# Patient Record
Sex: Male | Born: 1935 | Race: White | Hispanic: No | Smoking: Former smoker
Health system: Southern US, Community
[De-identification: ages and names within clinical notes are randomized; demographics above are authoritative.]

## PROBLEM LIST (undated history)

## (undated) DIAGNOSIS — T7840XA Allergy, unspecified, initial encounter: Secondary | ICD-10-CM

## (undated) DIAGNOSIS — J309 Allergic rhinitis, unspecified: Secondary | ICD-10-CM

## (undated) DIAGNOSIS — I10 Essential (primary) hypertension: Secondary | ICD-10-CM

## (undated) DIAGNOSIS — E785 Hyperlipidemia, unspecified: Secondary | ICD-10-CM

## (undated) DIAGNOSIS — K219 Gastro-esophageal reflux disease without esophagitis: Secondary | ICD-10-CM

## (undated) HISTORY — PX: SHOULDER SURGERY: SHX246

## (undated) HISTORY — DX: Allergic rhinitis, unspecified: J30.9

## (undated) HISTORY — DX: Essential (primary) hypertension: I10

## (undated) HISTORY — DX: Hyperlipidemia, unspecified: E78.5

## (undated) HISTORY — DX: Allergy, unspecified, initial encounter: T78.40XA

## (undated) HISTORY — DX: Gastro-esophageal reflux disease without esophagitis: K21.9

## (undated) HISTORY — PX: HAND SURGERY: SHX662

---

## 1973-10-08 HISTORY — PX: BACK SURGERY: SHX140

## 1987-06-11 HISTORY — PX: NOSE SURGERY: SHX723

## 1998-05-02 ENCOUNTER — Emergency Department (HOSPITAL_COMMUNITY): Admission: EM | Admit: 1998-05-02 | Discharge: 1998-05-02 | Payer: Self-pay | Admitting: Emergency Medicine

## 1998-05-09 ENCOUNTER — Emergency Department (HOSPITAL_COMMUNITY): Admission: EM | Admit: 1998-05-09 | Discharge: 1998-05-09 | Payer: Self-pay

## 2002-06-04 ENCOUNTER — Encounter: Admission: RE | Admit: 2002-06-04 | Discharge: 2002-06-04 | Payer: Self-pay | Admitting: Geriatric Medicine

## 2002-06-04 ENCOUNTER — Encounter: Payer: Self-pay | Admitting: Geriatric Medicine

## 2002-06-23 ENCOUNTER — Encounter: Payer: Self-pay | Admitting: Neurosurgery

## 2002-06-23 ENCOUNTER — Inpatient Hospital Stay (HOSPITAL_COMMUNITY): Admission: RE | Admit: 2002-06-23 | Discharge: 2002-06-24 | Payer: Self-pay | Admitting: Neurosurgery

## 2002-07-11 HISTORY — PX: BACK SURGERY: SHX140

## 2002-09-27 ENCOUNTER — Encounter: Payer: Self-pay | Admitting: Geriatric Medicine

## 2002-09-27 ENCOUNTER — Encounter: Admission: RE | Admit: 2002-09-27 | Discharge: 2002-09-27 | Payer: Self-pay | Admitting: Geriatric Medicine

## 2004-12-22 ENCOUNTER — Encounter: Admission: RE | Admit: 2004-12-22 | Discharge: 2004-12-22 | Payer: Self-pay | Admitting: Geriatric Medicine

## 2005-05-22 ENCOUNTER — Encounter: Admission: RE | Admit: 2005-05-22 | Discharge: 2005-05-22 | Payer: Self-pay | Admitting: Geriatric Medicine

## 2005-12-12 ENCOUNTER — Encounter: Admission: RE | Admit: 2005-12-12 | Discharge: 2005-12-12 | Payer: Self-pay | Admitting: Internal Medicine

## 2006-04-21 ENCOUNTER — Ambulatory Visit (HOSPITAL_COMMUNITY): Admission: RE | Admit: 2006-04-21 | Discharge: 2006-04-21 | Payer: Self-pay | Admitting: Geriatric Medicine

## 2006-06-30 ENCOUNTER — Encounter: Admission: RE | Admit: 2006-06-30 | Discharge: 2006-06-30 | Payer: Self-pay | Admitting: Orthopedic Surgery

## 2006-07-03 ENCOUNTER — Ambulatory Visit (HOSPITAL_BASED_OUTPATIENT_CLINIC_OR_DEPARTMENT_OTHER): Admission: RE | Admit: 2006-07-03 | Discharge: 2006-07-03 | Payer: Self-pay | Admitting: Orthopedic Surgery

## 2007-02-07 ENCOUNTER — Encounter: Admission: RE | Admit: 2007-02-07 | Discharge: 2007-02-07 | Payer: Self-pay | Admitting: Geriatric Medicine

## 2008-03-13 ENCOUNTER — Encounter: Admission: RE | Admit: 2008-03-13 | Discharge: 2008-03-13 | Payer: Self-pay | Admitting: Orthopedic Surgery

## 2008-11-01 ENCOUNTER — Ambulatory Visit (HOSPITAL_BASED_OUTPATIENT_CLINIC_OR_DEPARTMENT_OTHER): Admission: RE | Admit: 2008-11-01 | Discharge: 2008-11-01 | Payer: Self-pay | Admitting: Orthopedic Surgery

## 2009-04-03 ENCOUNTER — Ambulatory Visit: Payer: Self-pay | Admitting: Urology

## 2009-04-05 ENCOUNTER — Ambulatory Visit: Payer: Self-pay | Admitting: Urology

## 2010-06-13 ENCOUNTER — Ambulatory Visit (HOSPITAL_COMMUNITY)
Admission: RE | Admit: 2010-06-13 | Discharge: 2010-06-13 | Payer: Self-pay | Source: Home / Self Care | Attending: Internal Medicine | Admitting: Internal Medicine

## 2010-07-27 ENCOUNTER — Other Ambulatory Visit: Payer: Self-pay | Admitting: Specialist

## 2010-07-27 DIAGNOSIS — M542 Cervicalgia: Secondary | ICD-10-CM

## 2010-07-31 ENCOUNTER — Ambulatory Visit
Admission: RE | Admit: 2010-07-31 | Discharge: 2010-07-31 | Disposition: A | Discharge status: Home / Self Care | Payer: Self-pay | Source: Ambulatory Visit | Attending: Specialist | Admitting: Specialist

## 2010-07-31 ENCOUNTER — Ambulatory Visit
Admission: RE | Admit: 2010-07-31 | Discharge: 2010-07-31 | Disposition: A | Discharge status: Home / Self Care | Payer: MEDICARE | Source: Ambulatory Visit | Attending: Specialist | Admitting: Specialist

## 2010-07-31 DIAGNOSIS — M542 Cervicalgia: Secondary | ICD-10-CM

## 2010-09-18 LAB — BASIC METABOLIC PANEL
CO2: 29 mEq/L (ref 19–32)
Calcium: 9.3 mg/dL (ref 8.4–10.5)
Chloride: 104 mEq/L (ref 96–112)
Creatinine, Ser: 1.17 mg/dL (ref 0.4–1.5)
GFR calc non Af Amer: 60 mL/min — ABNORMAL LOW (ref >60)
Potassium: 4.4 mEq/L (ref 3.5–5.1)

## 2010-10-23 NOTE — Op Note (Signed)
NAMEESMERALDA, Frederick Simon                ACCOUNT NO.:  [id number]   MEDICAL RECORD NO.:  [id number]          PATIENT TYPE:  AMB   LOCATION:  DSC                          FACILITY:  [facility]   PHYSICIAN:  Cindee Salt, M.D.       DATE OF BIRTH:  11-29-1926   DATE OF PROCEDURE:  11/01/2008  DATE OF DISCHARGE:                               OPERATIVE REPORT   PREOPERATIVE DIAGNOSIS:  Carpal tunnel syndrome, left hand.   POSTOPERATIVE DIAGNOSIS:  Carpal tunnel syndrome, left hand.   OPERATION:  Decompression, left median nerve.   SURGEON:  Cindee Salt, MD   ASSISTANT:  Carolyne Fiscal, RN   ANESTHESIA:  Forearm-based IV regional with local infiltration.   ANESTHESIOLOGIST:  Bedelia Person, MD   HISTORY:  The patient is an 77 year old male with a history of carpal  tunnel syndrome, EMG nerve conduction is positive, which has not  responded to conservative treatment.  He has elected to undergo surgical  decompression.  Pre, peri, and postoperative course had been discussed  along with risks and complications.  He is aware that there is no  guarantee with the surgery, possibility of infection, recurrence injury  to arteries, nerves, tendons, complete relief of symptoms, or dystrophy.  In preoperative area, the patient is seen.  The extremity marked by both  the patient and surgeon.  Antibiotic given.   PROCEDURE:  The patient was brought to the operating room where a  forearm-based IV regional anesthetic was carried out without difficulty.  He was prepped using ChloraPrep in supine position with the left arm  free.  A 3-minute dry time was allowed, a time-out taken confirming the  patient and procedure.  The drapes were placed.  A longitudinal incision  was made and carried down through subcutaneous tissues.  Bleeders were  electrocauterized, bipolar.  Palmar fascia was split, superficial palmar  arch identified, and flexor tendon of the ring little finger identified  to the ulnar side of median nerve.  The  carpal retinaculum was incised  with sharp dissection, right angle and Sewall retractor were placed  between skin and forearm fascia.  Fascia was released for approximately  a centimeter and a half proximal to the wrist crease.  Under direct  vision, the canal was explored.  Area compression to the nerve was  apparent.  No further lesions were identified.  The wound was irrigated.  Skin was then closed with interrupted 5-0 Vicryl Rapide sutures.  A  local infiltration was given with 0.25% Marcaine without epinephrine, 5  mL was used.  A sterile compressive dressing and splint to the wrist was  applied.  Deflation of the tourniquet, all fingers immediately pinked.  He was taken to the recovery room for observation in satisfactory  condition.  He will be discharged home.  To return to the [facility] in 1 week, on Vicodin.           ______________________________  Cindee Salt, M.D.     GK/MEDQ  D:  11/01/2008  T:  11/01/2008  Job:  [id number]   cc:   Jonny Ruiz  Bo Mcclintock, MD

## 2010-10-26 NOTE — Op Note (Signed)
NAME:  Frederick Simon, Frederick Simon                          ACCOUNT NO.:  [id number]   MEDICAL RECORD NO.:  [id number]                   PATIENT TYPE:  INP   LOCATION:  2899                                 FACILITY:  [facility]   PHYSICIAN:  Hewitt Shorts, M.D.            DATE OF BIRTH:  1926/07/12   DATE OF PROCEDURE:  06/24/2002  DATE OF DISCHARGE:                                 OPERATIVE REPORT   PREOPERATIVE DIAGNOSIS:  Left L2-3 lumbar disk herniation.   POSTOPERATIVE DIAGNOSIS:  Left L2-3 lumbar disk herniation.   PROCEDURE:  Left L2-3 lumbar laminotomy and microdiskectomy.   SURGEON:  Hewitt Shorts, M.D.   ASSISTANT:  Danae Orleans. Venetia Maxon, M.D.   ANESTHESIA:  General endotracheal.   INDICATIONS:  The patient is a 77 year old man who presents with acute left  lumbar radiculopathy with significant iliopsoas and quadriceps weakness, who  was found by MRI scan to have a large left L2-3 disk herniation with a  fragment that had migrated caudally behind the body of L3.  The decision was  made to proceed with elective foraminotomy and microdiskectomy.   DESCRIPTION OF PROCEDURE:  The patient was brought to the operating room and  placed under general endotracheal anesthesia.  The patient was turned to a  prone region, and the lumbar region was prepped with Betadine soap and  solution and draped in a sterile fashion.  The midline was infiltrated with  local anesthetic with epinephrine, an x-ray was taken and the L2-3 level  identified in the midline, and incision made over the L2-3 level and carried  down through the subcutaneous tissue with bipolar cautery, and  electrocautery was used to maintain hemostasis.  Dissection was carried down  to the lumbar fascia, which was incised on the left side of the midline, and  the paraspinous muscles were dissected from the spinous processes and  laminae in a subperiosteal fashion.  An x-ray was taken and the L2-3  interlaminar space identified,  and then the microscope was draped and  brought into the field to provide additional magnification, illumination,  and visualization, and the remainder of the procedure was performed using  microdissection and microsurgical technique.  A laminotomy was performed  using the [facility] Max drill along with Kerrison punches.  The ligamentum  flavum was carefully resected, and we identified the thecal sac and left L3  nerve root.  We were able to gently retract the thecal sac and nerve root  and identified a fragment of disk directly ventral to the nerve root and  compressing it.  This was carefully dissected from the surrounding epidural  tissues and removed, and good decompression of the thecal sac and nerve root  was achieved.  We examined the L2-3 disk.  There certainly was disk bulging  and degeneration, but it was felt that the nerve had been decompressed by  removal of the free fragment and that further  diskectomy would not achieve  any further decompression, and therefore it was decided to establish  hemostasis with the use of bipolar cautery as well as Gelfoam soaked in  thrombin and proceed with closure.  Once hemostasis was established, the  wound was irrigated with bacitracin solution and checked for hemostasis once  again, and then the deep fascia closed with interrupted, undyed 1 Vicryl  suture, the subcutaneous and subcuticular were closed with interrupted,  inverted 2-0 undyed Vicryl sutures, and the  skin was reapproximated with Dermabond.  The patient tolerated the procedure  well.  The estimated blood loss was 100 cubic centimeters.  Sponge and  needle count were correct.  Following surgery the patient was turned back to  the supine position, reversed from the anesthetic, to be extubated and  transferred to the recovery room for further care.                                               Hewitt Shorts, M.D.    RWN/MEDQ  D:  06/23/2002  T:  06/24/2002  Job:  [id number]

## 2010-10-26 NOTE — Op Note (Signed)
NAMEYASIEL, GOYNE                ACCOUNT NO.:  [id number]   MEDICAL RECORD NO.:  [id number]          PATIENT TYPE:  AMB   LOCATION:  DSC                          FACILITY:  [facility]   PHYSICIAN:  Cindee Salt, M.D.       DATE OF BIRTH:  [birth year]   DATE OF PROCEDURE:  07/03/2006  DATE OF DISCHARGE:                               OPERATIVE REPORT   PREOPERATIVE DIAGNOSIS:  Carpal tunnel syndrome, right hand.   POSTOPERATIVE DIAGNOSIS:  Carpal tunnel syndrome, right hand.   OPERATION:  Decompression, right median nerve.   SURGEON:  Merlyn Lot, M.D.   ASSISTANT:  Carolyne Fiscal R.N.   ANESTHESIA:  Forearm-based IV regional.   HISTORY:  The patient is a 77 year old male with a history of carpal  tunnel syndrome, EMG nerve conductions positive.  This has not responded  to conservative treatment.  He has undergone release on his left side  and is admitted now for release of the right.  He is aware of risks and  complications including infection, recurrence, injury to arteries-nerves-  tendons, incomplete relief of symptoms, dystrophy.  In preoperative  area, the patient is seen.  Questions encouraged and answered.  The  extremity is marked by both patient and surgeon.   PROCEDURE:  The patient was brought to the operating room where a  forearm-based IV regional anesthetic was carried out without difficulty.  It was prepped using DuraPrep, supine position, right arm free.  A  longitudinal incision was made in the palm and carried down through  subcutaneous tissue.  Bleeders were electrocauterized.  Palmar fascia  was split.  Superficial palmar arch identified.  Flexor tendon to the  ring and little finger identified to the ulnar side of median nerve.  Carpal retinaculum was incised with sharp dissection.  A right-angle and  Sewall retractor were placed between skin and forearm fascia.  The  fascia released for approximately a centimeter and half proximal to the  wrist crease under direct vision.   Canal was explored.  No further  lesions were identified.  Area of compression to the nerve was apparent.  Persistent median artery was present.  This was not thrombosed.  The  wound was irrigated.  Skin closed with interrupted 5-0 nylon sutures.  Sterile compressive dressing and splint to the wrist applied.  The  patient tolerated the procedure well and was taken to the recovery room  for observation in satisfactory condition.  He is discharged home to  return to the [facility] in 1 week on Vicodin.           ______________________________  Cindee Salt, M.D.     GK/MEDQ  D:  07/03/2006  T:  07/03/2006  Job:  [id number]   cc:   Hal T. Stoneking, M.D.

## 2011-04-17 ENCOUNTER — Other Ambulatory Visit: Payer: Self-pay | Admitting: Internal Medicine

## 2011-04-17 DIAGNOSIS — E041 Nontoxic single thyroid nodule: Secondary | ICD-10-CM

## 2011-06-17 ENCOUNTER — Ambulatory Visit
Admission: RE | Admit: 2011-06-17 | Discharge: 2011-06-17 | Disposition: A | Discharge status: Home / Self Care | Payer: Medicare Other | Source: Ambulatory Visit | Attending: Internal Medicine | Admitting: Internal Medicine

## 2011-06-17 DIAGNOSIS — E041 Nontoxic single thyroid nodule: Secondary | ICD-10-CM

## 2012-04-30 ENCOUNTER — Other Ambulatory Visit: Payer: Self-pay | Admitting: Internal Medicine

## 2012-04-30 DIAGNOSIS — R1013 Epigastric pain: Secondary | ICD-10-CM

## 2012-04-30 DIAGNOSIS — E041 Nontoxic single thyroid nodule: Secondary | ICD-10-CM

## 2012-04-30 DIAGNOSIS — K3189 Other diseases of stomach and duodenum: Secondary | ICD-10-CM

## 2012-05-15 ENCOUNTER — Ambulatory Visit
Admission: RE | Admit: 2012-05-15 | Discharge: 2012-05-15 | Disposition: A | Discharge status: Home / Self Care | Payer: Medicare Other | Source: Ambulatory Visit | Attending: Internal Medicine | Admitting: Internal Medicine

## 2012-05-15 ENCOUNTER — Other Ambulatory Visit: Payer: Self-pay | Admitting: Internal Medicine

## 2012-05-15 DIAGNOSIS — K3189 Other diseases of stomach and duodenum: Secondary | ICD-10-CM

## 2012-05-15 DIAGNOSIS — R1013 Epigastric pain: Secondary | ICD-10-CM

## 2012-06-23 ENCOUNTER — Ambulatory Visit
Admission: RE | Admit: 2012-06-23 | Discharge: 2012-06-23 | Disposition: A | Discharge status: Home / Self Care | Payer: Medicare Other | Source: Ambulatory Visit | Attending: Internal Medicine | Admitting: Internal Medicine

## 2012-06-23 DIAGNOSIS — E041 Nontoxic single thyroid nodule: Secondary | ICD-10-CM

## 2013-04-08 ENCOUNTER — Other Ambulatory Visit: Payer: Self-pay | Admitting: Otolaryngology

## 2013-04-08 DIAGNOSIS — H905 Unspecified sensorineural hearing loss: Secondary | ICD-10-CM

## 2013-04-19 ENCOUNTER — Ambulatory Visit
Admission: RE | Admit: 2013-04-19 | Discharge: 2013-04-19 | Disposition: A | Discharge status: Home / Self Care | Payer: Medicare Other | Source: Ambulatory Visit | Attending: Otolaryngology | Admitting: Otolaryngology

## 2013-04-19 DIAGNOSIS — H905 Unspecified sensorineural hearing loss: Secondary | ICD-10-CM

## 2013-04-19 MED ORDER — GADOBENATE DIMEGLUMINE 529 MG/ML IV SOLN
17.0000 mL | Freq: Once | INTRAVENOUS | Status: AC | PRN
  Administered 2013-04-19: 17 mL via INTRAVENOUS

## 2013-05-13 ENCOUNTER — Ambulatory Visit (INDEPENDENT_AMBULATORY_CARE_PROVIDER_SITE_OTHER): Payer: 59 | Admitting: Emergency Medicine

## 2013-05-13 VITALS — BP 122/76 | HR 92 | Temp 97.9°F | Resp 16 | Ht 67.0 in | Wt 187.8 lb

## 2013-05-13 DIAGNOSIS — J209 Acute bronchitis, unspecified: Secondary | ICD-10-CM

## 2013-05-13 DIAGNOSIS — J018 Other acute sinusitis: Secondary | ICD-10-CM

## 2013-05-13 MED ORDER — AMOXICILLIN-POT CLAVULANATE 875-125 MG PO TABS: 1.0000 | ORAL_TABLET | Freq: Two times a day (BID) | ORAL | Status: DC

## 2013-05-13 NOTE — Progress Notes (Signed)
[facility] [address] [place] [address] [phone]  Date:  05/13/2013   Name:  Frederick Simon   DOB:  03-17-1927   MRN:  [id number]  PCP:  No primary provider on file.    Chief Complaint: Cough, Headache, Hoarse and Sore Throat   History of Present Illness:  Frederick Simon is a 79 y.o. very pleasant male patient who presents with the following:  Ill with cough productive green sputum.  No shortness of breath but is wheezing. No fever or chills.  No nausea or vomiting.  No rash. Has no nasal congestion or discharge but has moderate post nasal drainage.  No orthopnea or PND.  Has sore throat.  No improvement with over the counter medications or other home remedies. Denies other complaint or health concern today.   There are no active problems to display for this patient.   Past Medical History  Diagnosis Date  . Hypertension   . Hyperlipidemia   . Allergy     Past Surgical History  Procedure Laterality Date  . Back surgery  May 1975     Dr. Leslee Home  . Nose surgery  1989    Dr. Lyman Bishop  . Back surgery  Feb 2004    Dr. Newell Coral   . Hand surgery  [id number]    Dr. Priscille Kluver  . Shoulder surgery  2008,2009    Dr. Priscille Kluver    History  Substance Use Topics  . Smoking status: Current Every Day Smoker  . Smokeless tobacco: Not on file  . Alcohol Use: No    Family History  Problem Relation Age of Onset  . Cancer Mother   . Hypertension Mother   . Hypertension Father   . Stroke Father     No Known Allergies  Medication list has been reviewed and updated.  No current outpatient prescriptions on file prior to visit.   No current facility-administered medications on file prior to visit.    Review of Systems:  As per HPI, otherwise negative.    Physical Examination: Filed Vitals:   05/13/13 1056  BP: 122/76  Pulse: 92  Temp: 97.9 F (36.6 C)  Resp: 16   Filed Vitals:   05/13/13 1056  Height: 5\' 7"  (1.702 m)  Weight: 187  lb 12.8 oz (85.186 kg)   Body mass index is 29.41 kg/(m^2). Ideal Body Weight: Weight in (lb) to have BMI = 25: 159.3  GEN: WDWN, NAD, Non-toxic, A & O x 3 HEENT: Atraumatic, Normocephalic. Neck supple. No masses, No LAD. Ears and Nose: No external deformity. CV: RRR, No M/G/R. No JVD. No thrill. No extra heart sounds. PULM: CTA B, no wheezes, crackles, rhonchi. No retractions. No resp. distress. No accessory muscle use. ABD: S, NT, ND, +BS. No rebound. No HSM. EXTR: No c/c/e NEURO Normal gait.  PSYCH: Normally interactive. Conversant. Not depressed or anxious appearing.  Calm demeanor.    Assessment and Plan: Sinusitis Bronchitis augmentin Coricidin HBP  Signed,  Phillips Odor, MD

## 2013-05-13 NOTE — Patient Instructions (Signed)
siSinusitis Sinusitis is redness, soreness, and swelling (inflammation) of the paranasal sinuses. Paranasal sinuses are air pockets within the bones of your face (beneath the eyes, the middle of the forehead, or above the eyes). In healthy paranasal sinuses, mucus is able to drain out, and air is able to circulate through them by way of your nose. However, when your paranasal sinuses are inflamed, mucus and air can become trapped. This can allow bacteria and other germs to grow and cause infection. Sinusitis can develop quickly and last only a short time (acute) or continue over a long period (chronic). Sinusitis that lasts for more than 12 weeks is considered chronic.  CAUSES  Causes of sinusitis include:  Allergies.  Structural abnormalities, such as displacement of the cartilage that separates your nostrils (deviated septum), which can decrease the air flow through your nose and sinuses and affect sinus drainage.  Functional abnormalities, such as when the small hairs (cilia) that line your sinuses and help remove mucus do not work properly or are not present. SYMPTOMS  Symptoms of acute and chronic sinusitis are the same. The primary symptoms are pain and pressure around the affected sinuses. Other symptoms include:  Upper toothache.  Earache.  Headache.  Bad breath.  Decreased sense of smell and taste.  A cough, which worsens when you are lying flat.  Fatigue.  Fever.  Thick drainage from your nose, which often is green and may contain pus (purulent).  Swelling and warmth over the affected sinuses. DIAGNOSIS  Your caregiver will perform a physical exam. During the exam, your caregiver may:  Look in your nose for signs of abnormal growths in your nostrils (nasal polyps).  Tap over the affected sinus to check for signs of infection.  View the inside of your sinuses (endoscopy) with a special imaging device with a light attached (endoscope), which is inserted into your  sinuses. If your caregiver suspects that you have chronic sinusitis, one or more of the following tests may be recommended:  Allergy tests.  Nasal culture A sample of mucus is taken from your nose and sent to a lab and screened for bacteria.  Nasal cytology A sample of mucus is taken from your nose and examined by your caregiver to determine if your sinusitis is related to an allergy. TREATMENT  Most cases of acute sinusitis are related to a viral infection and will resolve on their own within 10 days. Sometimes medicines are prescribed to help relieve symptoms (pain medicine, decongestants, nasal steroid sprays, or saline sprays).  However, for sinusitis related to a bacterial infection, your caregiver will prescribe antibiotic medicines. These are medicines that will help kill the bacteria causing the infection.  Rarely, sinusitis is caused by a fungal infection. In theses cases, your caregiver will prescribe antifungal medicine. For some cases of chronic sinusitis, surgery is needed. Generally, these are cases in which sinusitis recurs more than 3 times per year, despite other treatments. HOME CARE INSTRUCTIONS   Drink plenty of water. Water helps thin the mucus so your sinuses can drain more easily.  Use a humidifier.  Inhale steam 3 to 4 times a day (for example, sit in the bathroom with the shower running).  Apply a warm, moist washcloth to your face 3 to 4 times a day, or as directed by your caregiver.  Use saline nasal sprays to help moisten and clean your sinuses.  Take over-the-counter or prescription medicines for pain, discomfort, or fever only as directed by your caregiver. SEEK IMMEDIATE MEDICAL  CARE IF:  You have increasing pain or severe headaches.  You have nausea, vomiting, or drowsiness.  You have swelling around your face.  You have vision problems.  You have a stiff neck.  You have difficulty breathing. MAKE SURE YOU:   Understand these  instructions.  Will watch your condition.  Will get help right away if you are not doing well or get worse. Document Released: 05/27/2005 Document Revised: 08/19/2011 Document Reviewed: 06/11/2011 [facility] Patient Information 2014 [place], [place]. Bronchitis Bronchitis is the body's way of reacting to injury and/or infection (inflammation) of the bronchi. Bronchi are the air tubes that extend from the windpipe into the lungs. If the inflammation becomes severe, it may cause shortness of breath. CAUSES  Inflammation may be caused by:  A virus.  Germs (bacteria).  Dust.  Allergens.  Pollutants and many other irritants. The cells lining the bronchial tree are covered with tiny hairs (cilia). These constantly beat upward, away from the lungs, toward the mouth. This keeps the lungs free of pollutants. When these cells become too irritated and are unable to do their job, mucus begins to develop. This causes the characteristic cough of bronchitis. The cough clears the lungs when the cilia are unable to do their job. Without either of these protective mechanisms, the mucus would settle in the lungs. Then you would develop pneumonia. Smoking is a common cause of bronchitis and can contribute to pneumonia. Stopping this habit is the single most important thing you can do to help yourself. TREATMENT   Your caregiver may prescribe an antibiotic if the cough is caused by bacteria. Also, medicines that open up your airways make it easier to breathe. Your caregiver may also recommend or prescribe an expectorant. It will loosen the mucus to be coughed up. Only take over-the-counter or prescription medicines for pain, discomfort, or fever as directed by your caregiver.  Removing whatever causes the problem (smoking, for example) is critical to preventing the problem from getting worse.  Cough suppressants may be prescribed for relief of cough symptoms.  Inhaled medicines may be prescribed to help with  symptoms now and to help prevent problems from returning.  For those with recurrent (chronic) bronchitis, there may be a need for steroid medicines. SEEK IMMEDIATE MEDICAL CARE IF:   During treatment, you develop more pus-like mucus (purulent sputum).  You have a fever.  You become progressively more ill.  You have increased difficulty breathing, wheezing, or shortness of breath. It is necessary to seek immediate medical care if you are elderly or sick from any other disease. MAKE SURE YOU:   Understand these instructions.  Will watch your condition.  Will get help right away if you are not doing well or get worse. Document Released: 05/27/2005 Document Revised: 01/27/2013 Document Reviewed: 01/19/2013 [facility] Patient Information 2014 [place], [place].

## 2017-06-26 ENCOUNTER — Emergency Department (HOSPITAL_BASED_OUTPATIENT_CLINIC_OR_DEPARTMENT_OTHER)
Admission: EM | Admit: 2017-06-26 | Discharge: 2017-06-27 | Disposition: A | Discharge status: Home / Self Care | Payer: Medicare Other | Attending: Emergency Medicine | Admitting: Emergency Medicine

## 2017-06-26 ENCOUNTER — Encounter (HOSPITAL_BASED_OUTPATIENT_CLINIC_OR_DEPARTMENT_OTHER): Payer: Self-pay

## 2017-06-26 ENCOUNTER — Other Ambulatory Visit: Payer: Self-pay

## 2017-06-26 DIAGNOSIS — Z7982 Long term (current) use of aspirin: Secondary | ICD-10-CM | POA: Diagnosis not present

## 2017-06-26 DIAGNOSIS — I10 Essential (primary) hypertension: Secondary | ICD-10-CM | POA: Diagnosis not present

## 2017-06-26 DIAGNOSIS — F17210 Nicotine dependence, cigarettes, uncomplicated: Secondary | ICD-10-CM | POA: Insufficient documentation

## 2017-06-26 DIAGNOSIS — R51 Headache: Secondary | ICD-10-CM | POA: Insufficient documentation

## 2017-06-26 DIAGNOSIS — Z79899 Other long term (current) drug therapy: Secondary | ICD-10-CM | POA: Insufficient documentation

## 2017-06-26 HISTORY — DX: Gastro-esophageal reflux disease without esophagitis: K21.9

## 2017-06-26 NOTE — ED Triage Notes (Addendum)
High blood pressure without any symptoms of chest pain, no SOB, no dizziness, no weakness, no numbness, no recent changes to meds, pt lives across from a nurse who told him to come to the ED immediately bc his BP was 200/100.  Pt alert and laughing in triage.  He took 324 aspirin prior to arrival.

## 2017-06-27 ENCOUNTER — Emergency Department (HOSPITAL_BASED_OUTPATIENT_CLINIC_OR_DEPARTMENT_OTHER): Payer: Medicare Other

## 2017-06-27 MED ORDER — HYDROCHLOROTHIAZIDE 25 MG PO TABS
12.5000 mg | ORAL_TABLET | Freq: Once | ORAL | Status: AC
  Administered 2017-06-27: 12.5 mg via ORAL
  Filled 2017-06-27: qty 1

## 2017-06-27 NOTE — ED Notes (Signed)
Patient transported to CT 

## 2017-06-27 NOTE — ED Provider Notes (Signed)
[facility] EMERGENCY DEPARTMENT Provider Note   CSN: [id number] Arrival date & time: 06/26/17  2310     History   Chief Complaint Chief Complaint  Patient presents with  . Hypertension    HPI Frederick Simon is a 84 y.o. male.  The history is provided by the patient.  Hypertension  This is a chronic problem. The current episode started more than 1 week ago. The problem occurs constantly. The problem has not changed since onset.Pertinent negatives include no chest pain, no abdominal pain and no shortness of breath. Nothing aggravates the symptoms. Nothing relieves the symptoms. He has tried nothing for the symptoms. The treatment provided no relief.   Has HTN but hasn't taken his BP in a while and took it tonight and it was elevated and saw a nurse who is a Network engineer and she told him to come in.  He is completely symptom free.  Does not recall if he took his meds today.   Past Medical History:  Diagnosis Date  . Acid reflux   . Allergy   . Hyperlipidemia   . Hypertension     There are no active problems to display for this patient.   Past Surgical History:  Procedure Laterality Date  . BACK SURGERY  May 1975    Dr. Leslee Home  . BACK SURGERY  Feb 2004   Dr. Newell Coral   . HAND SURGERY  2008,2010   Dr. Priscille Kluver  . NOSE SURGERY  1989   Dr. Lyman Bishop  . SHOULDER SURGERY  2008,2009   Dr. Priscille Kluver       Home Medications    Prior to Admission medications   Medication Sig Start Date End Date Taking? Authorizing Provider  aspirin EC 81 MG tablet Take 81 mg by mouth daily.   Yes [provider]  diphenhydramine-acetaminophen (TYLENOL PM) 25-500 MG TABS Take 1 tablet by mouth at bedtime as needed.   Yes [provider]  Fish Oil-Cholecalciferol (FISH OIL + D3) 1000-1000 MG-UNIT CAPS Take by mouth.   Yes [provider]  hydrochlorothiazide (HYDRODIURIL) 12.5 MG tablet Take 12.5 mg by mouth daily.   Yes [provider]    lisinopril (PRINIVIL,ZESTRIL) 5 MG tablet Take 5 mg by mouth every other day.    Yes [provider]  Multiple Vitamins-Minerals (MULTIVITAMIN WITH MINERALS) tablet Take 1 tablet by mouth daily.   Yes [provider]  ranitidine (ZANTAC) 150 MG tablet Take 150 mg by mouth daily.    Yes [provider]  simvastatin (ZOCOR) 20 MG tablet Take 20 mg by mouth daily.   Yes [provider]  amoxicillin-clavulanate (AUGMENTIN) 875-125 MG per tablet Take 1 tablet by mouth 2 (two) times daily. 05/13/13   Carmelina Dane, MD    Family History Family History  Problem Relation Age of Onset  . Cancer Mother   . Hypertension Mother   . Hypertension Father   . Stroke Father     Social History Social History   Tobacco Use  . Smoking status: Current Every Day Smoker  Substance Use Topics  . Alcohol use: No  . Drug use: No     Allergies   Patient has no known allergies.   Review of Systems Review of Systems  Eyes: Negative for photophobia and visual disturbance.  Respiratory: Negative for shortness of breath.   Cardiovascular: Negative for chest pain.  Gastrointestinal: Negative for abdominal pain.  Neurological: Negative for dizziness, tremors, seizures, syncope, facial asymmetry, speech difficulty,  weakness, light-headedness and numbness.  All other systems reviewed and are negative.    Physical Exam Updated Vital Signs BP (!) 172/92 (BP Location: Right Arm)   Pulse 68   Temp 98.2 F (36.8 C) (Oral)   Resp 18   Ht 5\' 9"  (1.753 m)   Wt 77.1 kg (170 lb)   SpO2 99%   BMI 25.10 kg/m   Physical Exam  Constitutional: He is oriented to person, place, and time. He appears well-developed and well-nourished. No distress.  HENT:  Head: Normocephalic and atraumatic.  Nose: Nose normal.  Mouth/Throat: No oropharyngeal exudate.  Eyes: Conjunctivae and EOM are normal. Pupils are equal, round, and reactive to light.  Neck: Normal range of motion.  Neck supple.  Cardiovascular: Normal rate, regular rhythm, normal heart sounds and intact distal pulses.  Pulmonary/Chest: Effort normal and breath sounds normal. No respiratory distress.  Abdominal: Soft. Bowel sounds are normal. There is no tenderness.  Musculoskeletal: Normal range of motion.  Neurological: He is alert and oriented to person, place, and time. He displays normal reflexes. No cranial nerve deficit. Coordination normal.  Skin: Skin is warm and dry. Capillary refill takes less than 2 seconds.  Psychiatric: He has a normal mood and affect.     ED Treatments / Results   Vitals:   06/27/17 0110 06/27/17 0234  BP: (!) 169/93 (!) 172/92  Pulse: 68 68  Resp: 18 18  Temp:    SpO2: 99% 99%    Radiology Ct Head Wo Contrast  Result Date: 06/27/2017 CLINICAL DATA:  Hypertension. Intermittent headache. No reported injury. EXAM: CT HEAD WITHOUT CONTRAST TECHNIQUE: Contiguous axial images were obtained from the base of the skull through the vertex without intravenous contrast. COMPARISON:  04/19/2013 brain MRI. FINDINGS: Brain: No evidence of parenchymal hemorrhage or extra-axial fluid collection. No mass lesion, mass effect, or midline shift. No CT evidence of acute infarction. Generalized cerebral volume loss. Nonspecific mild subcortical and periventricular white matter hypodensity, most in keeping with chronic small vessel ischemic change. No ventriculomegaly. Vascular: No acute abnormality. Skull: No evidence of calvarial fracture. Sinuses/Orbits: The visualized paranasal sinuses are essentially clear. Other:  The mastoid air cells are unopacified. IMPRESSION: 1.  No evidence of acute intracranial abnormality. 2. Generalized cerebral volume loss and mild chronic small vessel ischemic changes in the cerebral white matter. Electronically Signed   By: Delbert Phenix M.D.   On: 06/27/2017 02:26    Procedures Procedures (including critical care time)  Medications Ordered in  ED Medications  hydrochlorothiazide (HYDRODIURIL) tablet 12.5 mg (12.5 mg Oral Given 06/27/17 0151)       Final Clinical Impressions(s) / ED Diagnoses   Final diagnoses:  Essential hypertension    Follow up with PMD.  Return for worsening pain, vomiting blood inability to pass urine,  fevers > 100.4 unrelieved by medication, shortness of breath, intractable vomiting, or diarrhea, abdominal pain, Inability to tolerate liquids or food, cough, altered mental status or any concerns. No signs of systemic illness or infection. The patient is nontoxic-appearing on exam and vital signs are within normal limits.    I have reviewed the triage vital signs and the nursing notes. Pertinent labs &imaging results that were available during my care of the patient were reviewed by me and considered in my medical decision making (see chart for details).  After history, exam, and medical workup I feel the patient has been appropriately medically screened and is safe for discharge home. Pertinent diagnoses were discussed with the patient.  Patient was given return precautions.     Russel Morain, MD 06/27/17 [phone]

## 2017-06-27 NOTE — ED Notes (Signed)
Pt verbalizes understanding of d/c instructions and denies any further needs at this time. 

## 2018-01-23 ENCOUNTER — Other Ambulatory Visit: Payer: Self-pay | Admitting: Internal Medicine

## 2018-01-23 DIAGNOSIS — G4452 New daily persistent headache (NDPH): Secondary | ICD-10-CM

## 2018-01-23 DIAGNOSIS — J321 Chronic frontal sinusitis: Secondary | ICD-10-CM

## 2018-01-29 ENCOUNTER — Ambulatory Visit
Admission: RE | Admit: 2018-01-29 | Discharge: 2018-01-29 | Disposition: A | Discharge status: Home / Self Care | Payer: Medicare Other | Source: Ambulatory Visit | Attending: Internal Medicine | Admitting: Internal Medicine

## 2018-01-29 DIAGNOSIS — G4452 New daily persistent headache (NDPH): Secondary | ICD-10-CM

## 2018-01-29 DIAGNOSIS — J321 Chronic frontal sinusitis: Secondary | ICD-10-CM

## 2019-02-15 ENCOUNTER — Encounter (HOSPITAL_COMMUNITY): Payer: Self-pay

## 2019-02-15 ENCOUNTER — Emergency Department (HOSPITAL_COMMUNITY): Payer: Medicare Other

## 2019-02-15 ENCOUNTER — Emergency Department (HOSPITAL_COMMUNITY)
Admission: EM | Admit: 2019-02-15 | Discharge: 2019-02-15 | Disposition: A | Discharge status: Home / Self Care | Payer: Medicare Other | Attending: Emergency Medicine | Admitting: Emergency Medicine

## 2019-02-15 ENCOUNTER — Other Ambulatory Visit: Payer: Self-pay

## 2019-02-15 DIAGNOSIS — M5410 Radiculopathy, site unspecified: Secondary | ICD-10-CM

## 2019-02-15 DIAGNOSIS — M545 Low back pain, unspecified: Secondary | ICD-10-CM

## 2019-02-15 DIAGNOSIS — M5416 Radiculopathy, lumbar region: Secondary | ICD-10-CM | POA: Insufficient documentation

## 2019-02-15 DIAGNOSIS — F17200 Nicotine dependence, unspecified, uncomplicated: Secondary | ICD-10-CM | POA: Diagnosis not present

## 2019-02-15 DIAGNOSIS — I10 Essential (primary) hypertension: Secondary | ICD-10-CM | POA: Diagnosis not present

## 2019-02-15 DIAGNOSIS — Z79899 Other long term (current) drug therapy: Secondary | ICD-10-CM | POA: Diagnosis not present

## 2019-02-15 MED ORDER — PREDNISONE 20 MG PO TABS: ORAL_TABLET | ORAL | 0 refills | Status: DC

## 2019-02-15 MED ORDER — TRAMADOL HCL 50 MG PO TABS
50.0000 mg | ORAL_TABLET | Freq: Once | ORAL | Status: AC
  Administered 2019-02-15: 50 mg via ORAL
  Filled 2019-02-15: qty 1

## 2019-02-15 MED ORDER — PREDNISONE 20 MG PO TABS
40.0000 mg | ORAL_TABLET | Freq: Once | ORAL | Status: AC
  Administered 2019-02-15: 40 mg via ORAL
  Filled 2019-02-15: qty 2

## 2019-02-15 MED ORDER — TIZANIDINE HCL 2 MG PO TABS: 2.0000 mg | ORAL_TABLET | Freq: Three times a day (TID) | ORAL | 0 refills | Status: DC | PRN

## 2019-02-15 NOTE — ED Notes (Signed)
Patient returned from xray.

## 2019-02-15 NOTE — Discharge Instructions (Addendum)
It was our pleasure to provide your ER care today - we hope that you feel better.  Take prednisone as prescribed. Take tizanidine for muscle pain/spasm. You may also try acetaminophen as need for pain.  Follow up with primary care doctor in the coming week for recheck -  also have your blood pressure rechecked then, as it is high today.   Return to ER if worse, new symptoms, worsening or severe pain, leg numbness/weakness, or other concern.   You were given pain medication in the ER - no driving for the next 6 hours.

## 2019-02-15 NOTE — ED Provider Notes (Signed)
[place] DEPT Provider Note   CSN: [id number] Arrival date & time: 02/15/19  1245     History   Chief Complaint Chief Complaint  Patient presents with  . Back Pain    HPI Frederick Simon is a 85 y.o. male.     Patient c/o low back pain for the past few days. Symptoms acute onset, moderate, dull to sharp, worse w certain movements, positional changes, occasionally radiating down right leg. Similar to prior back pain, hx ddd and prior back surgery x 2. No saddle area numbness. No leg numbness/weakness. No urinary retention, no stood or urine incontinence. No anterior pain, no abd or pelvic pain. No dysuria or hematuria. Took acetaminophen will minimal relief. Denies fever or chills.   The history is provided by the patient.  Back Pain Associated symptoms: no abdominal pain, no chest pain, no fever, no headaches and no weakness     Past Medical History:  Diagnosis Date  . Acid reflux   . Allergy   . Hyperlipidemia   . Hypertension     There are no active problems to display for this patient.   Past Surgical History:  Procedure Laterality Date  . BACK SURGERY  May 1975    Dr. Collier Salina  . BACK SURGERY  Feb 2004   Dr. Sherwood Gambler   . HAND SURGERY  2008,2010   Dr. Telford Nab  . NOSE SURGERY  1989   Dr. Purcell Nails  . SHOULDER SURGERY  2008,2009   Dr. Telford Nab        Home Medications    Prior to Admission medications   Medication Sig Start Date End Date Taking? Authorizing Provider  amoxicillin-clavulanate (AUGMENTIN) 875-125 MG per tablet Take 1 tablet by mouth 2 (two) times daily. 05/13/13   Roselee Culver, MD  aspirin EC 81 MG tablet Take 81 mg by mouth daily.    [provider]  diphenhydramine-acetaminophen (TYLENOL PM) 25-500 MG TABS Take 1 tablet by mouth at bedtime as needed.    [provider]  Fish Oil-Cholecalciferol (FISH OIL + D3) 1000-1000 MG-UNIT CAPS Take by mouth.    [provider]   hydrochlorothiazide (HYDRODIURIL) 12.5 MG tablet Take 12.5 mg by mouth daily.    [provider]  lisinopril (PRINIVIL,ZESTRIL) 5 MG tablet Take 5 mg by mouth every other day.     [provider]  Multiple Vitamins-Minerals (MULTIVITAMIN WITH MINERALS) tablet Take 1 tablet by mouth daily.    [provider]  ranitidine (ZANTAC) 150 MG tablet Take 150 mg by mouth daily.     [provider]  simvastatin (ZOCOR) 20 MG tablet Take 20 mg by mouth daily.    [provider]    Family History Family History  Problem Relation Age of Onset  . Cancer Mother   . Hypertension Mother   . Hypertension Father   . Stroke Father     Social History Social History   Tobacco Use  . Smoking status: Current Every Day Smoker  Substance Use Topics  . Alcohol use: No  . Drug use: No     Allergies   Patient has no known allergies.   Review of Systems Review of Systems  Constitutional: Negative for chills and fever.  HENT: Negative for sore throat.   Eyes: Negative for redness.  Respiratory: Negative for shortness of breath.   Cardiovascular: Negative for chest pain.  Gastrointestinal: Negative for abdominal pain and vomiting.  Genitourinary: Negative for flank pain and hematuria.  Musculoskeletal: Positive for back pain.  Skin: Negative for rash.  Neurological: Negative for seizures, weakness and headaches.  Hematological: Does not bruise/bleed easily.  Psychiatric/Behavioral: Negative for confusion.     Physical Exam Updated Vital Signs BP (!) 190/106   Pulse 72   Temp 97.9 F (36.6 C) (Oral)   Resp 16   SpO2 98%   Physical Exam Vitals signs and nursing note reviewed.  Constitutional:      Appearance: Normal appearance. He is well-developed.  HENT:     Head: Atraumatic.     Nose: Nose normal.     Mouth/Throat:     Mouth: Mucous membranes are moist.     Pharynx: Oropharynx is clear.  Eyes:     General: No scleral icterus.     Conjunctiva/sclera: Conjunctivae normal.  Neck:     Musculoskeletal: Normal range of motion and neck supple. No neck rigidity.     Trachea: No tracheal deviation.  Cardiovascular:     Rate and Rhythm: Normal rate and regular rhythm.     Pulses: Normal pulses.     Heart sounds: Normal heart sounds. No murmur. No friction rub. No gallop.   Pulmonary:     Effort: Pulmonary effort is normal. No accessory muscle usage or respiratory distress.     Breath sounds: Normal breath sounds.  Abdominal:     General: Bowel sounds are normal. There is no distension.     Palpations: Abdomen is soft.     Tenderness: There is no abdominal tenderness. There is no guarding.     Comments: No pulsatile mass.   Genitourinary:    Comments: No cva tenderness. Musculoskeletal:        General: No swelling or tenderness.  Skin:    General: Skin is warm and dry.     Findings: No rash.  Neurological:     Mental Status: He is alert.     Comments: Alert, speech clear. Motor intact bil lower ext, stre 5/5. sens grossly intact bil. Steady gait.   Psychiatric:        Mood and Affect: Mood normal.      ED Treatments / Results  Labs (all labs ordered are listed, but only abnormal results are displayed) Labs Reviewed - No data to display  EKG None  Radiology Dg Lumbar Spine Complete  Result Date: 02/15/2019 CLINICAL DATA:  Pt states having lower back pain for a few days. Worsened today. Previous back surgeries but no known injury EXAM: LUMBAR SPINE - COMPLETE 4+ VIEW COMPARISON:  None. FINDINGS: Five non-rib-bearing lumbar-type vertebral bodies. Vertebral body heights are maintained without evidence of fracture. There is minimal retrolisthesis of L3 on L4 likely on a degenerative basis. Otherwise alignment intact. Moderate intervertebral disc space loss at L3-4. There is mild disc space loss at L2-3. Scattered anterior spurring. Lower lumbar facet hypertrophy. Nonobstructive bowel gas pattern. Visualized lung  bases are clear. Scattered atherosclerotic calcification in the abdominal aorta. IMPRESSION: 1. No acute osseous abnormality in the lumbar spine. 2. Multilevel degenerative disc disease, worse at L3-4. Electronically Signed   By: Emmaline KluverNancy  Ballantyne M.D.   On: 02/15/2019 14:01    Procedures Procedures (including critical care time)  Medications Ordered in ED Medications  traMADol (ULTRAM) tablet 50 mg (has no administration in time range)     Initial Impression / Assessment and Plan / ED Course  I have reviewed the triage vital signs and the nursing notes.  Pertinent labs & imaging results that were available during my  care of the patient were reviewed by me and considered in my medical decision making (see chart for details).  Ultram po. Xrays.  Reviewed nursing notes and prior charts for additional history.   Xrays reviewed by me - no fx.   Recheck, pain improved.   Pt with low back pain radiating to right leg, c/w prior back pain, worse/episodic with certain movement/bending/position change - pain appears most c/w ddd/sciatica.   Prednisone po. Short course rx for home.   F/u pcp. Return precautions provided.     Final Clinical Impressions(s) / ED Diagnoses   Final diagnoses:  None    ED Discharge Orders    None       Cathren Laine, MD 02/15/19 1439

## 2019-02-15 NOTE — ED Triage Notes (Addendum)
Patient BIB [facility] from home. Patient complains of back pain, which has progressively gotten worse over the past 3 days. Patient reports pain is in left lower back, right above the buttocks, and radiates down right leg. Patient denies recent injury- endorses history of 2 back surgeries. Patient reports the pain is worse when walking. Patient ambulatory in triage, with standby assistance. Denies distal paresthesias in BLE or BUE. Pt AxOx4. Patient reports hx of hypertension.   EMS: HR 68, 98% RA, 16RR, 98.15F Temporal, CBG=98

## 2019-02-16 MED FILL — FLUAD QUADRIVALENT 0.5 ML P: 0.5 | 1 days supply | Qty: 1 | Fill #0

## 2019-02-17 MED FILL — FLUAD QUADRIVALENT 0.5 ML P: 0.5 | 1 days supply | Qty: 1 | Fill #0

## 2019-10-19 DIAGNOSIS — H9113 Presbycusis, bilateral: Secondary | ICD-10-CM | POA: Insufficient documentation

## 2019-12-31 IMAGING — CR DG LUMBAR SPINE COMPLETE 4+V
5 series · 5 of 5 positions shown · non-contrast
Comparison: None.

CLINICAL DATA: Pt states having lower back pain for a few days.
Worsened today. Previous back surgeries but no known injury

EXAM:
LUMBAR SPINE - COMPLETE 4+ VIEW

[t lumbar spine ap]
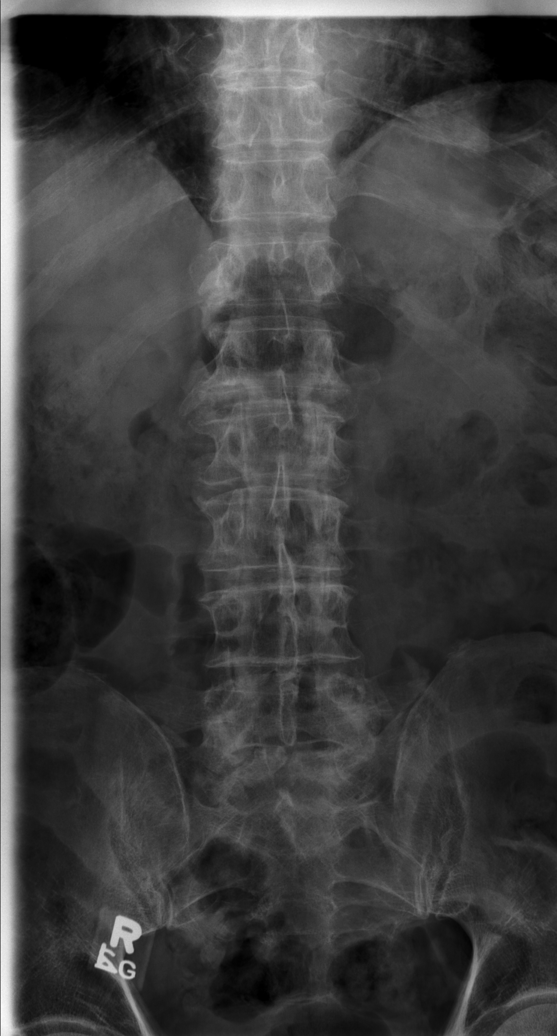

[t lumbar spine obl (1 of 2)]
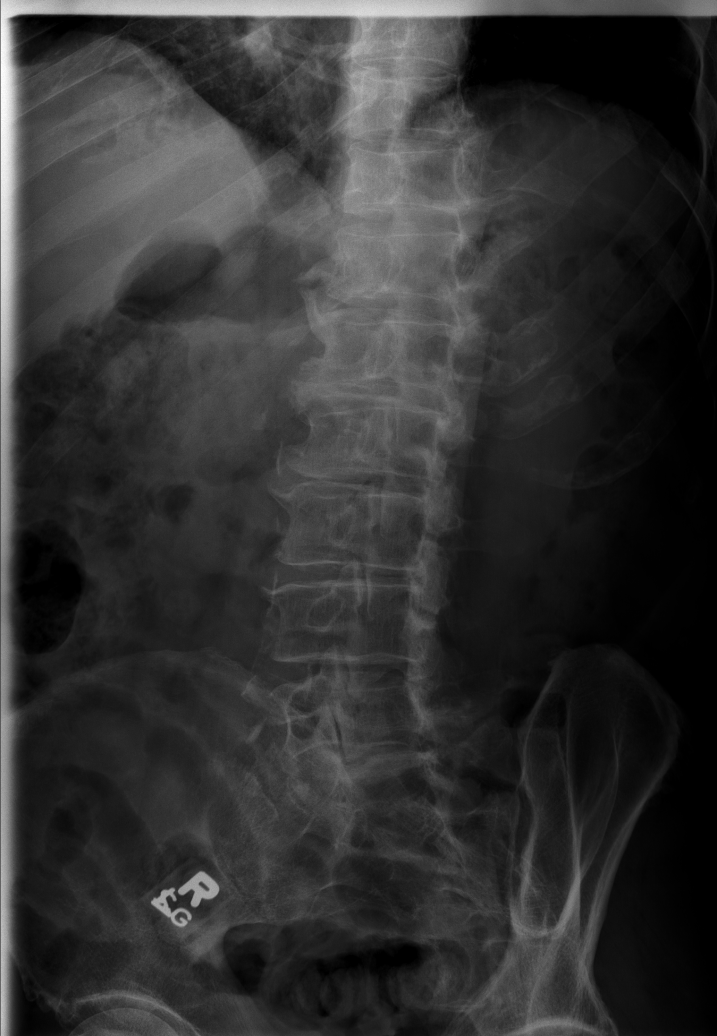

[t lumbar spine obl (2 of 2)]
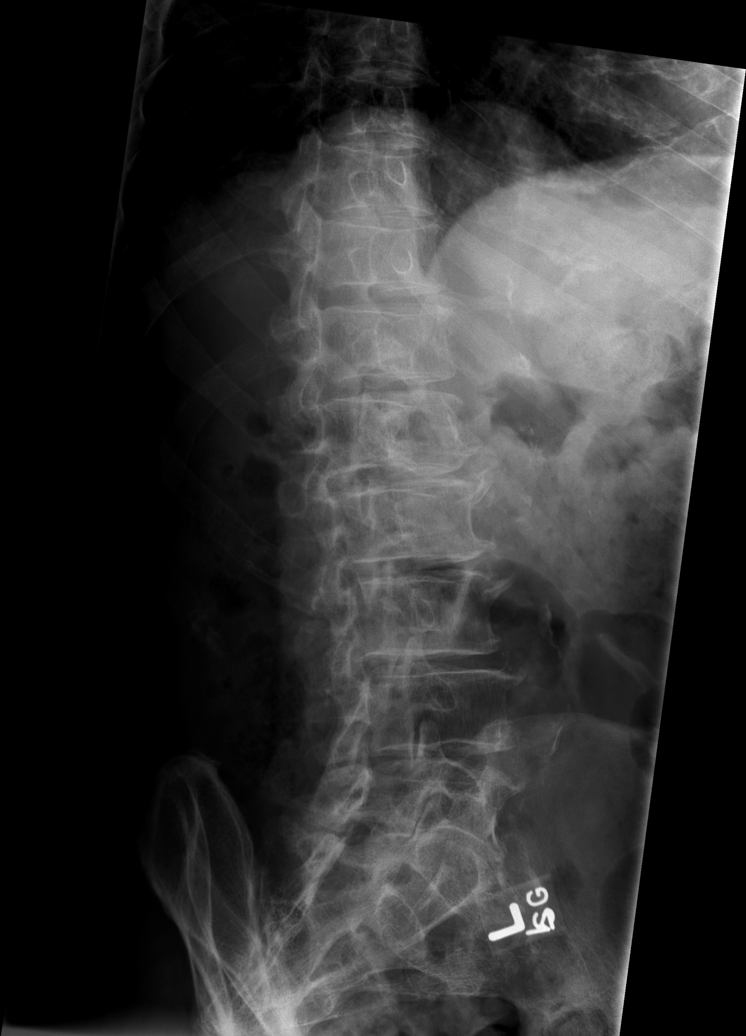

[t lumbar spine lat]
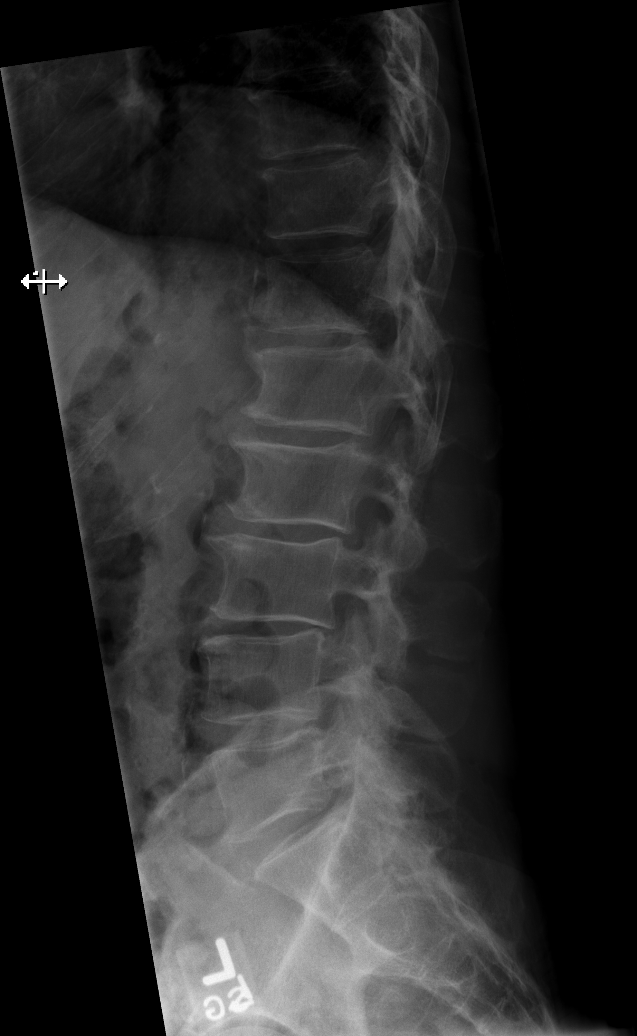

[t lumbar l-5 s-1 spot]
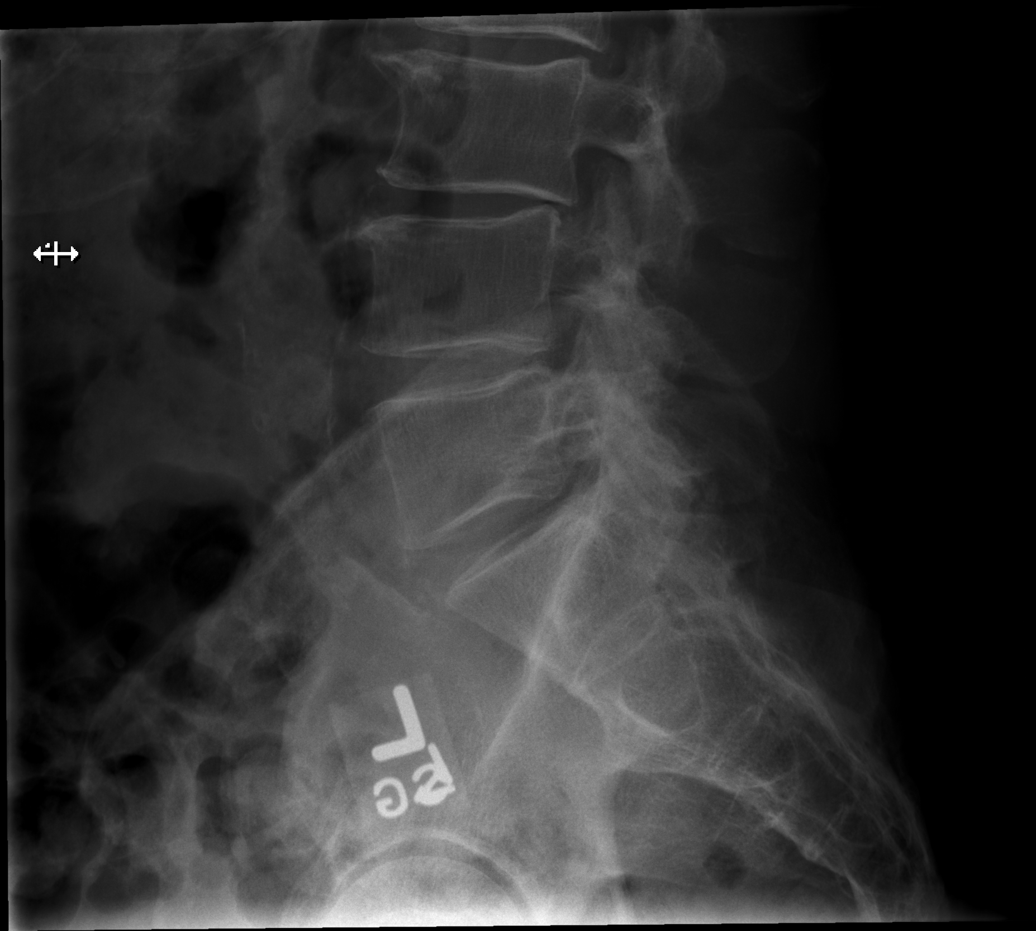

[5 of 5 positions shown; findings below may reference images not displayed]

FINDINGS: Five non-rib-bearing lumbar-type vertebral bodies. Vertebral body
heights are maintained without evidence of fracture. There is
minimal retrolisthesis of L3 on L4 likely on a degenerative basis.
Otherwise alignment intact. Moderate intervertebral disc space loss
at L3-4. There is mild disc space loss at L2-3. Scattered anterior
spurring. Lower lumbar facet hypertrophy. Nonobstructive bowel gas
pattern. Visualized lung bases are clear. Scattered atherosclerotic
calcification in the abdominal aorta.
IMPRESSION: 1. No acute osseous abnormality in the lumbar spine.
2. Multilevel degenerative disc disease, worse at L3-4.

## 2020-03-03 MED FILL — FLUAD QUADRIVALENT 0.5 ML P: 0.5 | 1 days supply | Qty: 1 | Fill #0

## 2020-07-31 ENCOUNTER — Encounter: Payer: Self-pay | Admitting: Internal Medicine

## 2020-07-31 DIAGNOSIS — K219 Gastro-esophageal reflux disease without esophagitis: Secondary | ICD-10-CM | POA: Insufficient documentation

## 2020-07-31 DIAGNOSIS — J309 Allergic rhinitis, unspecified: Secondary | ICD-10-CM | POA: Insufficient documentation

## 2020-07-31 DIAGNOSIS — I10 Essential (primary) hypertension: Secondary | ICD-10-CM | POA: Insufficient documentation

## 2020-07-31 DIAGNOSIS — E785 Hyperlipidemia, unspecified: Secondary | ICD-10-CM | POA: Insufficient documentation

## 2020-08-01 ENCOUNTER — Telehealth: Payer: Self-pay

## 2020-08-01 NOTE — Telephone Encounter (Signed)
Med list reconciled.  

## 2020-08-03 ENCOUNTER — Encounter: Payer: Self-pay | Admitting: Internal Medicine

## 2020-08-03 ENCOUNTER — Ambulatory Visit: Payer: Medicare Other | Admitting: Internal Medicine

## 2020-08-03 ENCOUNTER — Other Ambulatory Visit: Payer: Self-pay

## 2020-08-03 DIAGNOSIS — I10 Essential (primary) hypertension: Secondary | ICD-10-CM

## 2020-08-03 DIAGNOSIS — F03B0 Unspecified dementia, moderate, without behavioral disturbance, psychotic disturbance, mood disturbance, and anxiety: Secondary | ICD-10-CM | POA: Insufficient documentation

## 2020-08-03 DIAGNOSIS — K219 Gastro-esophageal reflux disease without esophagitis: Secondary | ICD-10-CM | POA: Diagnosis not present

## 2020-08-03 DIAGNOSIS — E785 Hyperlipidemia, unspecified: Secondary | ICD-10-CM | POA: Diagnosis not present

## 2020-08-03 DIAGNOSIS — M159 Polyosteoarthritis, unspecified: Secondary | ICD-10-CM | POA: Insufficient documentation

## 2020-08-03 DIAGNOSIS — G3184 Mild cognitive impairment, so stated: Secondary | ICD-10-CM

## 2020-08-03 DIAGNOSIS — M8949 Other hypertrophic osteoarthropathy, multiple sites: Secondary | ICD-10-CM

## 2020-08-03 NOTE — Assessment & Plan Note (Signed)
Mostly back and shoulders Episodic flares Will have tylenol for him to use independently  Ibuprofen prn (staff)

## 2020-08-03 NOTE — Assessment & Plan Note (Signed)
On B12 for this Now in supervised living arrangement but independent with ADLs (except meds)

## 2020-08-03 NOTE — Assessment & Plan Note (Signed)
Has done well with simvastatin for primary prevention Since [facility] most likely of vascular origin--this especially makes sense

## 2020-08-03 NOTE — Assessment & Plan Note (Signed)
Doing fine without meds at this point

## 2020-08-03 NOTE — Assessment & Plan Note (Signed)
BP Readings from Last 3 Encounters:  08/03/20 (!) 145/80  02/15/19 (!) 185/95  06/27/17 (!) 172/92   Borderline---this is fine for 87 year old Continue the losartan

## 2020-08-03 NOTE — Progress Notes (Signed)
Subjective:    Patient ID: Frederick Simon, male    DOB: Oct 03, 1926, 87 y.o.   MRN: [id number]  HPI Visit in assisted living room to establish care Daughter Mindi Junker is there Reviewed status with Magda Paganini RN  Independent with ADLs Continent Walks independently but generally using cane when goes out--mostly for balance Has staff doing meds (had some lapses before moving here)  On Rx for HTN--goes back Tolerates the medication fine No chest pain No SOB No dizziness or syncope No edema  On statin No history of MI or CVA  Family chose to start B12 to help memory About 2-3 years ago  Known GERD but no recent heartburn Rarely will have "choking sensation"--but not often  Some chronic back pain---has had past surgeries 2 past back surgeries--tends to flare at times Tylenol does help Uses ibuprofen as well--- 400mg   Current Outpatient Medications on File Prior to Visit  Medication Sig Dispense Refill  . aspirin EC 81 MG tablet Take 81 mg by mouth daily.    . Cholecalciferol (VITAMIN D) 50 MCG (2000 UT) CAPS Take by mouth.    . losartan (COZAAR) 100 MG tablet Take 100 mg by mouth daily.    . simvastatin (ZOCOR) 20 MG tablet Take 20 mg by mouth daily.    . vitamin B-12 (CYANOCOBALAMIN) 1000 MCG tablet Take 1,000 mcg by mouth daily.     No current facility-administered medications on file prior to visit.    No Known Allergies  Past Medical History:  Diagnosis Date  . Allergic rhinitis   . Gastroesophageal reflux disease   . Hyperlipidemia   . Hypertension     Past Surgical History:  Procedure Laterality Date  . BACK SURGERY  May 1975    Dr. June 1975  . BACK SURGERY  Feb 2004   Dr. Mar 2004   . HAND SURGERY  2008,2010   Dr. 05-09-1986  . NOSE SURGERY  1989   Dr. Priscille Kluver  . SHOULDER SURGERY  2008,2009   Dr. 01-23-1997    Family History  Problem Relation Age of Onset  . Cancer Mother   . Hypertension Mother   . Hypertension Father   . Stroke Father   . Lupus  Sister     Social History   Socioeconomic History  . Marital status: Married    Spouse name: Not on file  . Number of children: 4  . Years of education: Not on file  . Highest education level: Not on file  Occupational History  . Occupation: Priscille Kluver: [employer]  Tobacco Use  . Smoking status: Former Geologist, engineering  . Smokeless tobacco: Never Used  . Tobacco comment: quit 1980's  Substance and Sexual Activity  . Alcohol use: No  . Drug use: No  . Sexual activity: Not on file  Other Topics Concern  . Not on file  Social History Narrative   4 daughters      Has advanced directives   Daughter Games developer is health care POA   Requests DNR--done 08/03/20   No feeding tube if cognitively unaware   Social Determinants of Health   Financial Resource Strain: Not on file  Food Insecurity: Not on file  Transportation Needs: Not on file  Physical Activity: Not on file  Stress: Not on file  Social Connections: Not on file  Intimate Partner Violence: Not on file   Review of Systems Vision is okay Hearing aides--they help some Appetite is good Weight is stable Sleeps well Doesn't drive  Objective:   Physical Exam Constitutional:      Appearance: Normal appearance.  HENT:     Mouth/Throat:     Comments: No lesions Eyes:     Conjunctiva/sclera: Conjunctivae normal.     Pupils: Pupils are equal, round, and reactive to light.  Cardiovascular:     Rate and Rhythm: Normal rate and regular rhythm.     Pulses: Normal pulses.     Heart sounds: No murmur heard. No gallop.   Pulmonary:     Effort: Pulmonary effort is normal.     Breath sounds: Normal breath sounds. No wheezing or rales.  Abdominal:     Palpations: Abdomen is soft.     Tenderness: There is no abdominal tenderness.  Musculoskeletal:     Cervical back: Neck supple.     Right lower leg: No edema.     Left lower leg: No edema.  Lymphadenopathy:     Cervical: No cervical adenopathy.  Skin:     General: Skin is warm.     Findings: No rash.  Neurological:     Mental Status: He is alert and oriented to person, place, and time.     Comments: President--"Biden, Trump,?" [id number] D-[id number] Recall 2/3  Psychiatric:        Mood and Affect: Mood normal.        Behavior: Behavior normal.            Assessment & Plan:

## 2020-10-31 ENCOUNTER — Ambulatory Visit: Payer: Medicare Other | Admitting: Internal Medicine

## 2020-12-01 ENCOUNTER — Non-Acute Institutional Stay: Payer: Medicare Other | Admitting: Internal Medicine

## 2020-12-01 ENCOUNTER — Other Ambulatory Visit: Payer: Self-pay

## 2020-12-01 DIAGNOSIS — I10 Essential (primary) hypertension: Secondary | ICD-10-CM

## 2020-12-01 DIAGNOSIS — G3184 Mild cognitive impairment, so stated: Secondary | ICD-10-CM | POA: Diagnosis not present

## 2020-12-01 DIAGNOSIS — M8949 Other hypertrophic osteoarthropathy, multiple sites: Secondary | ICD-10-CM

## 2020-12-01 DIAGNOSIS — M159 Polyosteoarthritis, unspecified: Secondary | ICD-10-CM

## 2020-12-01 DIAGNOSIS — K219 Gastro-esophageal reflux disease without esophagitis: Secondary | ICD-10-CM

## 2020-12-01 DIAGNOSIS — E7800 Pure hypercholesterolemia, unspecified: Secondary | ICD-10-CM

## 2020-12-06 ENCOUNTER — Encounter: Payer: Self-pay | Admitting: Internal Medicine

## 2020-12-06 NOTE — Assessment & Plan Note (Signed)
No issues off meds °

## 2020-12-06 NOTE — Patient Instructions (Signed)
Heart-Healthy Eating Plan Heart-healthy meal planning includes: Eating less unhealthy fats. Eating more healthy fats. Making other changes in your diet. Talk with your doctor or a diet specialist (dietitian) to create an eating plan that is right for you. What is my plan? Your doctor may recommend an eating plan that includes: Total fat: ______% or less of total calories a day. Saturated fat: ______% or less of total calories a day. Cholesterol: less than _________mg a day. What are tips for following this plan? Cooking Avoid frying your food. Try to bake, boil, grill, or broil it instead. You can also reduce fat by: Removing the skin from poultry. Removing all visible fats from meats. Steaming vegetables in water or broth. Meal planning  At meals, divide your plate into four equal parts: Fill one-half of your plate with vegetables and green salads. Fill one-fourth of your plate with whole grains. Fill one-fourth of your plate with lean protein foods. Eat 4-5 servings of vegetables per day. A serving of vegetables is: 1 cup of raw or cooked vegetables. 2 cups of raw leafy greens. Eat 4-5 servings of fruit per day. A serving of fruit is: 1 medium whole fruit.  cup of dried fruit.  cup of fresh, frozen, or canned fruit.  cup of 100% fruit juice. Eat more foods that have soluble fiber. These are apples, broccoli, carrots, beans, peas, and barley. Try to get 20-30 g of fiber per day. Eat 4-5 servings of nuts, legumes, and seeds per week: 1 serving of dried beans or legumes equals  cup after being cooked. 1 serving of nuts is  cup. 1 serving of seeds equals 1 tablespoon.  General information Eat more home-cooked food. Eat less restaurant, buffet, and fast food. Limit or avoid alcohol. Limit foods that are high in starch and sugar. Avoid fried foods. Lose weight if you are overweight. Keep track of how much salt (sodium) you eat. This is important if you have high blood  pressure. Ask your doctor to tell you more about this. Try to add vegetarian meals each week. Fats Choose healthy fats. These include olive oil and canola oil, flaxseeds, walnuts, almonds, and seeds. Eat more omega-3 fats. These include salmon, mackerel, sardines, tuna, flaxseed oil, and ground flaxseeds. Try to eat fish at least 2 times each week. Check food labels. Avoid foods with trans fats or high amounts of saturated fat. Limit saturated fats. These are often found in animal products, such as meats, butter, and cream. These are also found in plant foods, such as palm oil, palm kernel oil, and coconut oil. Avoid foods with partially hydrogenated oils in them. These have trans fats. Examples are stick margarine, some tub margarines, cookies, crackers, and other baked goods. What foods can I eat? Fruits All fresh, canned (in natural juice), or frozen fruits. Vegetables Fresh or frozen vegetables (raw, steamed, roasted, or grilled). Green salads. Grains Most grains. Choose whole wheat and whole grains most of the time. Rice andpasta, including brown rice and pastas made with whole wheat. Meats and other proteins Lean, well-trimmed beef, veal, pork, and lamb. Chicken and turkey without skin. All fish and shellfish. Wild duck, rabbit, pheasant, and venison. Egg whites or low-cholesterol egg substitutes. Dried beans, peas, lentils, and tofu. Seedsand most nuts. Dairy Low-fat or nonfat cheeses, including ricotta and mozzarella. Skim or 1% milk that is liquid, powdered, or evaporated. Buttermilk that is made with low-fatmilk. Nonfat or low-fat yogurt. Fats and oils Non-hydrogenated (trans-free) margarines. Vegetable oils, including soybean, sesame,   sunflower, olive, peanut, safflower, corn, canola, and cottonseed. Salad dressings or mayonnaisemade with a vegetable oil. Beverages Mineral water. Coffee and tea. Diet carbonated beverages. Sweets and desserts Sherbet, gelatin, and fruit ice. Small  amounts of dark chocolate. Limit all sweets and desserts. Seasonings and condiments All seasonings and condiments. The items listed above may not be a complete list of foods and drinks you can eat. Contact a dietitian for more options. What foods should I avoid? Fruits Canned fruit in heavy syrup. Fruit in cream or butter sauce. Fried fruit. Limitcoconut. Vegetables Vegetables cooked in cheese, cream, or butter sauce. Fried vegetables. Grains Breads that are made with saturated or trans fats, oils, or whole milk. Croissants. Sweet rolls. Donuts. High-fat crackers,such as cheese crackers. Meats and other proteins Fatty meats, such as hot dogs, ribs, sausage, bacon, rib-eye roast or steak. High-fat deli meats, such as salami and bologna. Caviar. Domestic duck andgoose. Organ meats, such as liver. Dairy Cream, sour cream, cream cheese, and creamed cottage cheese. Whole-milk cheeses. Whole or 2% milk that is liquid, evaporated, or condensed. Whole buttermilk. Cream sauce or high-fat cheese sauce. Yogurt that is made fromwhole milk. Fats and oils Meat fat, or shortening. Cocoa butter, hydrogenated oils, palm oil, coconut oil, palm kernel oil. Solid fats and shortenings, including bacon fat, salt pork, lard, and butter. Nondairy cream substitutes. Salad dressings with cheeseor sour cream. Beverages Regular sodas and juice drinks with added sugar. Sweets and desserts Frosting. Pudding. Cookies. Cakes. Pies. Milk chocolate or white chocolate.Buttered syrups. Full-fat ice cream or ice cream drinks. The items listed above may not be a complete list of foods and drinks to avoid. Contact a dietitian for more information. Summary Heart-healthy meal planning includes eating less unhealthy fats, eating more healthy fats, and making other changes in your diet. Eat a balanced diet. This includes fruits and vegetables, low-fat or nonfat dairy, lean protein, nuts and legumes, whole grains, and heart-healthy  oils and fats. This information is not intended to replace advice given to you by your health care provider. Make sure you discuss any questions you have with your healthcare provider. Document Revised: 07/31/2017 Document Reviewed: 07/04/2017 Elsevier Patient Education  2022 Elsevier Inc.  

## 2020-12-06 NOTE — Assessment & Plan Note (Signed)
Appreciate ALF care 

## 2020-12-06 NOTE — Progress Notes (Signed)
Subjective:    Patient ID: Frederick Simon, male    DOB: [birth year], 87 y.o.   MRN: [id number]  HPI  Resident seen in [facility] to [facility] No new concerns from staff or resident She sleeps well, independent with ADLs.  She denies any urinary incontinence.  Her bowels are moving okay.  She reports her mood is good.  She walks with a cane.  HTN: Her BP today is 110/69.  She is taking Losartan and HCTZ as prescribed.  HLD: She denies myalgias on Simvastatin.  GERD: No issues off meds.  OA: She is not currently taking any medications OTC for this.  MCI: Mild cognitive issues, stable functional needs.  Review of Systems     Past Medical History:  Diagnosis Date   Allergic rhinitis    Gastroesophageal reflux disease    Hyperlipidemia    Hypertension     Current Outpatient Medications  Medication Sig Dispense Refill   aspirin EC 81 MG tablet Take 81 mg by mouth daily.     Cholecalciferol (VITAMIN D) 50 MCG (2000 UT) CAPS Take by mouth.     losartan (COZAAR) 100 MG tablet Take 100 mg by mouth daily.     simvastatin (ZOCOR) 20 MG tablet Take 20 mg by mouth daily.     vitamin B-12 (CYANOCOBALAMIN) 1000 MCG tablet Take 1,000 mcg by mouth daily.     No current facility-administered medications for this visit.    No Known Allergies  Family History  Problem Relation Age of Onset   Cancer Mother    Hypertension Mother    Hypertension Father    Stroke Father    Lupus Sister     Social History   Socioeconomic History   Marital status: Married    Spouse name: Not on file   Number of children: 4   Years of education: Not on file   Highest education level: Not on file  Occupational History   Occupation: Scientist, forensic: [employer]  Tobacco Use   Smoking status: Former    Pack years: 0.00   Smokeless tobacco: Never   Tobacco comments:    quit 1980's  Substance and Sexual Activity   Alcohol use: No   Drug use: No   Sexual activity: Not on file  Other Topics Concern    Not on file  Social History Narrative   4 daughters      Has advanced directives   Daughter Jana Half is health care POA   Requests DNR--done 08/03/20   No feeding tube if cognitively unaware   Social Determinants of Health   Financial Resource Strain: Not on file  Food Insecurity: Not on file  Transportation Needs: Not on file  Physical Activity: Not on file  Stress: Not on file  Social Connections: Not on file  Intimate Partner Violence: Not on file     Constitutional: Denies fever, malaise, fatigue, headache or abrupt weight changes.  HEENT: Denies eye pain, eye redness, ear pain, ringing in the ears, wax buildup, runny nose, nasal congestion, bloody nose, or sore throat. Respiratory: Denies difficulty breathing, shortness of breath, cough or sputum production.   Cardiovascular: Denies chest pain, chest tightness, palpitations or swelling in the hands or feet.  Gastrointestinal: Denies abdominal pain, bloating, constipation, diarrhea or blood in the stool.  GU: Denies urgency, frequency, pain with urination, burning sensation, blood in urine, odor or discharge. Musculoskeletal: Denies decrease in range of motion, difficulty with gait, muscle pain or joint pain  and swelling.  Skin: Denies redness, rashes, lesions or ulcercations.  Neurological: Denies dizziness, difficulty with memory, difficulty with speech or problems with balance and coordination.  Psych: Denies anxiety, depression, SI/HI.  No other specific complaints in a complete review of systems (except as listed in HPI above).  Objective:   Physical Exam    BP 110/69   Pulse 70   Temp 97.8 F (36.6 C)   Resp 18   Wt 190 lb 9.6 oz (86.5 kg)   BMI 28.15 kg/m  Wt Readings from Last 3 Encounters:  12/06/20 190 lb 9.6 oz (86.5 kg)  08/03/20 177 lb (80.3 kg)  06/26/17 170 lb (77.1 kg)    General: Appears her stated age, well developed, well nourished in NAD. Skin: Warm, dry and intact.  Neck:  Neck supple,  trachea midline. No masses, lumps or thyromegaly present.  Cardiovascular: Normal rate and rhythm. S1,S2 noted.  No murmur, rubs or gallops noted. No JVD or BLE edema.  Pulmonary/Chest: Normal effort and positive vesicular breath sounds. No respiratory distress. No wheezes, rales or ronchi noted.  Abdomen: Soft and nontender. Normal bowel sounds. No distention or masses noted.  Neurological: Alert and oriented.  Psychiatric: Mood and affect normal.   BMET    Component Value Date/Time   NA 140 10/27/2008 1105   K 4.4 10/27/2008 1105   CL 104 10/27/2008 1105   CO2 29 10/27/2008 1105   GLUCOSE 111 (H) 10/27/2008 1105   BUN 20 10/27/2008 1105   CREATININE 1.17 10/27/2008 1105   CALCIUM 9.3 10/27/2008 1105   GFRNONAA 60 (L) 10/27/2008 1105   GFRAA  10/27/2008 1105    >60        The eGFR has been calculated using the MDRD equation. This calculation has not been validated in all clinical situations. eGFR's persistently <60 mL/min signify possible Chronic Kidney Disease.    Lipid Panel  No results found for: CHOL, TRIG, HDL, CHOLHDL, VLDL, LDLCALC  CBC    Component Value Date/Time   HGB 15.6 11/01/2008 0810    Hgb A1C No results found for: HGBA1C        Assessment & Plan:    Webb Silversmith, NP This visit occurred during the SARS-CoV-2 public health emergency.  Safety protocols were in place, including screening questions prior to the visit, additional usage of staff PPE, and extensive cleaning of exam room while observing appropriate contact time as indicated for disinfecting solutions.

## 2020-12-06 NOTE — Assessment & Plan Note (Signed)
Controlled on losartan and HCTZ Will monitor

## 2020-12-06 NOTE — Assessment & Plan Note (Signed)
Continue simvastatin. 

## 2020-12-06 NOTE — Assessment & Plan Note (Addendum)
Encourage regular physical activity Not medicated at this time

## 2020-12-27 ENCOUNTER — Non-Acute Institutional Stay: Payer: Medicare Other | Admitting: Internal Medicine

## 2020-12-27 VITALS — BP 119/72 | HR 67 | Temp 95.8°F | Resp 18 | Wt 188.6 lb

## 2020-12-27 DIAGNOSIS — I10 Essential (primary) hypertension: Secondary | ICD-10-CM | POA: Diagnosis not present

## 2020-12-27 DIAGNOSIS — E663 Overweight: Secondary | ICD-10-CM

## 2020-12-27 DIAGNOSIS — H6982 Other specified disorders of Eustachian tube, left ear: Secondary | ICD-10-CM

## 2020-12-27 DIAGNOSIS — Z6828 Body mass index (BMI) 28.0-28.9, adult: Secondary | ICD-10-CM | POA: Diagnosis not present

## 2020-12-30 ENCOUNTER — Encounter: Payer: Self-pay | Admitting: Internal Medicine

## 2020-12-30 MED ORDER — CETIRIZINE HCL 10 MG PO TABS: 10.0000 mg | ORAL_TABLET | Freq: Every day | ORAL | 11 refills | Status: DC

## 2020-12-30 NOTE — Patient Instructions (Signed)
Eustachian Tube Dysfunction  Eustachian tube dysfunction refers to a condition in which a blockage develops in the narrow passage that connects the middle ear to the back of the nose (eustachian tube). The eustachian tube regulates air pressure in the middle ear by letting air move between the ear and nose. It also helps to drain fluid from the middle earspace. Eustachian tube dysfunction can affect one or both ears. When the eustachian tube does not function properly, air pressure, fluid, or both can build up inthe middle ear. What are the causes? This condition occurs when the eustachian tube becomes blocked or cannot open normally. Common causes of this condition include: Ear infections. Colds and other infections that affect the nose, mouth, and throat (upper respiratory tract). Allergies. Irritation from cigarette smoke. Irritation from stomach acid coming up into the esophagus (gastroesophageal reflux). The esophagus is the tube that carries food from the mouth to the stomach. Sudden changes in air pressure, such as from descending in an airplane or scuba diving. Abnormal growths in the nose or throat, such as: Growths that line the nose (nasal polyps). Abnormal growth of cells (tumors). Enlarged tissue at the back of the throat (adenoids). What increases the risk? You are more likely to develop this condition if: You smoke. You are overweight. You are a child who has: Certain birth defects of the mouth, such as cleft palate. Large tonsils or adenoids. What are the signs or symptoms? Common symptoms of this condition include: A feeling of fullness in the ear. Ear pain. Clicking or popping noises in the ear. Ringing in the ear. Hearing loss. Loss of balance. Dizziness. Symptoms may get worse when the air pressure around you changes, such as whenyou travel to an area of high elevation, fly on an airplane, or go scuba diving. How is this diagnosed? This condition may be diagnosed  based on: Your symptoms. A physical exam of your ears, nose, and throat. Tests, such as those that measure: The movement of your eardrum (tympanogram). Your hearing (audiometry). How is this treated? Treatment depends on the cause and severity of your condition. In mild cases, you may relieve your symptoms by moving air into your ears. This is called "popping the ears." In more severe cases, or if you have symptoms of fluid in your ears, treatment may include: Medicines to relieve congestion (decongestants). Medicines that treat allergies (antihistamines). Nasal sprays or ear drops that contain medicines that reduce swelling (steroids). A procedure to drain the fluid in your eardrum (myringotomy). In this procedure, a small tube is placed in the eardrum to: Drain the fluid. Restore the air in the middle ear space. A procedure to insert a balloon device through the nose to inflate the opening of the eustachian tube (balloon dilation). Follow these instructions at home: Lifestyle Do not do any of the following until your health care provider approves: Travel to high altitudes. Fly in airplanes. Work in a pressurized cabin or room. Scuba dive. Do not use any products that contain nicotine or tobacco, such as cigarettes and e-cigarettes. If you need help quitting, ask your health care provider. Keep your ears dry. Wear fitted earplugs during showering and bathing. Dry your ears completely after. General instructions Take over-the-counter and prescription medicines only as told by your health care provider. Use techniques to help pop your ears as recommended by your health care provider. These may include: Chewing gum. Yawning. Frequent, forceful swallowing. Closing your mouth, holding your nose closed, and gently blowing as if you   are trying to blow air out of your nose. Keep all follow-up visits as told by your health care provider. This is important. Contact a health care provider  if: Your symptoms do not go away after treatment. Your symptoms come back after treatment. You are unable to pop your ears. You have: A fever. Pain in your ear. Pain in your head or neck. Fluid draining from your ear. Your hearing suddenly changes. You become very dizzy. You lose your balance. Summary Eustachian tube dysfunction refers to a condition in which a blockage develops in the eustachian tube. It can be caused by ear infections, allergies, inhaled irritants, or abnormal growths in the nose or throat. Symptoms include ear pain, hearing loss, or ringing in the ears. Mild cases are treated with maneuvers to unblock the ears, such as yawning or ear popping. Severe cases are treated with medicines. Surgery may also be done (rare). This information is not intended to replace advice given to you by your health care provider. Make sure you discuss any questions you have with your healthcare provider. Document Revised: 09/16/2017 Document Reviewed: 09/16/2017 Elsevier Patient Education  2022 Elsevier Inc.  

## 2020-12-30 NOTE — Progress Notes (Signed)
Subjective:    Patient ID: Frederick Simon, male    DOB: 09-Mar-1927, 87 y.o.   MRN: [id number]  HPI  Asked to see resident in [address] BP has improved since starting on HCTZ No muscle cramps reported  Also c/o left ear pain, started 2 days ago Describes the pain as sore and achy He denies headache, runny nose, nasal congestion or sore throat He has decreased hearing but this is his baseline He denies fever, chills or body aches.   Review of Systems     Past Medical History:  Diagnosis Date   Allergic rhinitis    Gastroesophageal reflux disease    Hyperlipidemia    Hypertension     Current Outpatient Medications  Medication Sig Dispense Refill   aspirin EC 81 MG tablet Take 81 mg by mouth daily.     Cholecalciferol (VITAMIN D) 50 MCG (2000 UT) CAPS Take by mouth.     losartan (COZAAR) 100 MG tablet Take 100 mg by mouth daily.     simvastatin (ZOCOR) 20 MG tablet Take 20 mg by mouth daily.     vitamin B-12 (CYANOCOBALAMIN) 1000 MCG tablet Take 1,000 mcg by mouth daily.     No current facility-administered medications for this visit.    No Known Allergies  Family History  Problem Relation Age of Onset   Cancer Mother    Hypertension Mother    Hypertension Father    Stroke Father    Lupus Sister     Social History   Socioeconomic History   Marital status: Married    Spouse name: Not on file   Number of children: 4   Years of education: Not on file   Highest education level: Not on file  Occupational History   Occupation: Scientist, forensic: [employer]  Tobacco Use   Smoking status: Former   Smokeless tobacco: Never   Tobacco comments:    quit 1980's  Substance and Sexual Activity   Alcohol use: No   Drug use: No   Sexual activity: Not on file  Other Topics Concern   Not on file  Social History Narrative   4 daughters      Has advanced directives   Daughter Jana Half is health care POA   Requests DNR--done 08/03/20   No feeding tube if  cognitively unaware   Social Determinants of Health   Financial Resource Strain: Not on file  Food Insecurity: Not on file  Transportation Needs: Not on file  Physical Activity: Not on file  Stress: Not on file  Social Connections: Not on file  Intimate Partner Violence: Not on file     Constitutional: Denies fever, malaise, fatigue, headache or abrupt weight changes.  HEENT: Pt reports left ear pain. Denies eye pain, eye redness, ear pain, ringing in the ears, wax buildup, runny nose, nasal congestion, bloody nose, or sore throat. Respiratory: Denies difficulty breathing, shortness of breath, cough or sputum production.   Cardiovascular: Denies chest pain, chest tightness, palpitations or swelling in the hands or feet.  Marland Kitchen  Psych: Denies anxiety, depression, SI/HI.  No other specific complaints in a complete review of systems (except as listed in HPI above).  Objective:   Physical Exam  BP 119/72   Pulse 67   Temp (!) 95.8 F (35.4 C)   Resp 18   Wt 188 lb 9.6 oz (85.5 kg)   SpO2 96%   BMI 27.85 kg/m  Wt Readings from Last 3 Encounters:  12/30/20 188 lb 9.6 oz (85.5 kg)  12/06/20 190 lb 9.6 oz (86.5 kg)  08/03/20 177 lb (80.3 kg)    General: Appears his stated age, overweight in NAD. Skin: Warm, dry and intact.  HEENT: Head: normal shape and size;  Ears: Tm's gray and intact, normal light reflex, + serous effusion on the left;  Neck:  No adenopathy noted. Cardiovascular: Normal rate and rhythm. S1,S2 noted.  No murmur, rubs or gallops noted.  Pulmonary/Chest: Normal effort and positive vesicular breath sounds. No respiratory distress. No wheezes, rales or ronchi noted.  Neurological: Alert and oriented.   BMET    Component Value Date/Time   NA 140 10/27/2008 1105   K 4.4 10/27/2008 1105   CL 104 10/27/2008 1105   CO2 29 10/27/2008 1105   GLUCOSE 111 (H) 10/27/2008 1105   BUN 20 10/27/2008 1105   CREATININE 1.17 10/27/2008 1105   CALCIUM 9.3 10/27/2008 1105    GFRNONAA 60 (L) 10/27/2008 1105   GFRAA  10/27/2008 1105    >60        The eGFR has been calculated using the MDRD equation. This calculation has not been validated in all clinical situations. eGFR's persistently <60 mL/min signify possible Chronic Kidney Disease.    Lipid Panel  No results found for: CHOL, TRIG, HDL, CHOLHDL, VLDL, LDLCALC  CBC    Component Value Date/Time   HGB 15.6 11/01/2008 0810    Hgb A1C No results found for: HGBA1C          Assessment & Plan:   ETD, Left:  No evidence of infection Start Zyrtec 10 mg daily Can consider Flonase 1 spray BID prn if symptoms persist  HTN:   Controlled on HCTZ Will check kidney function every 6 months  Will reassess as needed Webb Silversmith, NP This visit occurred during the SARS-CoV-2 public health emergency.  Safety protocols were in place, including screening questions prior to the visit, additional usage of staff PPE, and extensive cleaning of exam room while observing appropriate contact time as indicated for disinfecting solutions.

## 2021-03-08 ENCOUNTER — Ambulatory Visit: Payer: Medicare Other | Admitting: Internal Medicine

## 2021-03-08 ENCOUNTER — Other Ambulatory Visit: Payer: Self-pay

## 2021-03-08 ENCOUNTER — Encounter: Payer: Self-pay | Admitting: Internal Medicine

## 2021-03-08 VITALS — BP 132/82 | HR 78 | Temp 97.7°F | Resp 18 | Wt 192.8 lb

## 2021-03-08 DIAGNOSIS — G3184 Mild cognitive impairment, so stated: Secondary | ICD-10-CM | POA: Diagnosis not present

## 2021-03-08 DIAGNOSIS — N1832 Chronic kidney disease, stage 3b: Secondary | ICD-10-CM | POA: Diagnosis not present

## 2021-03-08 DIAGNOSIS — K219 Gastro-esophageal reflux disease without esophagitis: Secondary | ICD-10-CM

## 2021-03-08 DIAGNOSIS — I10 Essential (primary) hypertension: Secondary | ICD-10-CM

## 2021-03-08 DIAGNOSIS — M8949 Other hypertrophic osteoarthropathy, multiple sites: Secondary | ICD-10-CM

## 2021-03-08 DIAGNOSIS — M159 Polyosteoarthritis, unspecified: Secondary | ICD-10-CM

## 2021-03-08 NOTE — Assessment & Plan Note (Signed)
BP Readings from Last 3 Encounters:  03/08/21 132/82  12/30/20 119/72  12/06/20 110/69   Doing fine He had some very low readings---but not noted by staff If any issues, would consider stopping HCTZ and just staying with losartan

## 2021-03-08 NOTE — Assessment & Plan Note (Signed)
Uses tylenol.  °

## 2021-03-08 NOTE — Assessment & Plan Note (Signed)
Does need the tums twice a week or so If this increases, would start evening famotidine

## 2021-03-08 NOTE — Progress Notes (Signed)
Subjective:    Patient ID: Frederick Simon, male    DOB: Feb 18, 1927, 87 y.o.   MRN: [id number]  HPI Visit in assisted living apartment for review of chronic medical conditions Reviewed status with Magda Paganini RN  Doing well Goes to health care daily to see wife--2 hours in AM  BP seems fine Runs low at times----systolic under 100 at times when he checks No headache No dizziness or syncope No chest pain or SOB No edema  Walks with cane Independent with ADLs Remains continent  Mild memory problems persists No apparent worsening  Allergies controlled with nasal spray  Occasional heartburn---uses tums prn (twice a week) No dysphagia  Last GFR 33  Current Outpatient Medications on File Prior to Visit  Medication Sig Dispense Refill   aspirin EC 81 MG tablet Take 81 mg by mouth daily.     Cholecalciferol (VITAMIN D) 50 MCG (2000 UT) CAPS Take by mouth.     fluticasone (FLONASE) 50 MCG/ACT nasal spray Place 2 sprays into both nostrils daily.     hydrochlorothiazide (HYDRODIURIL) 25 MG tablet Take 25 mg by mouth daily.     losartan (COZAAR) 100 MG tablet Take 100 mg by mouth daily.     simvastatin (ZOCOR) 20 MG tablet Take 20 mg by mouth daily.     vitamin B-12 (CYANOCOBALAMIN) 1000 MCG tablet Take 1,000 mcg by mouth daily.     No current facility-administered medications on file prior to visit.    No Known Allergies  Past Medical History:  Diagnosis Date   Allergic rhinitis    Gastroesophageal reflux disease    Hyperlipidemia    Hypertension     Past Surgical History:  Procedure Laterality Date   BACK SURGERY  May 1975    Dr. Hilton Cork SURGERY  Feb 2004   Dr. Newell Coral    HAND SURGERY  [phone]   Dr. Priscille Kluver   NOSE SURGERY  1989   Dr. Ned Grace SURGERY  [phone]   Dr. Priscille Kluver    Family History  Problem Relation Age of Onset   Cancer Mother    Hypertension Mother    Hypertension Father    Stroke Father    Lupus Sister     Social  History   Socioeconomic History   Marital status: Married    Spouse name: Not on file   Number of children: 4   Years of education: Not on file   Highest education level: Not on file  Occupational History   Occupation: Geologist, engineering: [employer]  Tobacco Use   Smoking status: Former   Smokeless tobacco: Never   Tobacco comments:    quit 1980's  Substance and Sexual Activity   Alcohol use: No   Drug use: No   Sexual activity: Not on file  Other Topics Concern   Not on file  Social History Narrative   4 daughters      Has advanced directives   Daughter [name] is health care POA   Requests DNR--done 08/03/20   No feeding tube if cognitively unaware   Social Determinants of Health   Financial Resource Strain: Not on file  Food Insecurity: Not on file  Transportation Needs: Not on file  Physical Activity: Not on file  Stress: Not on file  Social Connections: Not on file  Intimate Partner Violence: Not on file   Review of Systems Appetite is good Weight is up some He walks regularly ---at least 30  minutes (outside weather permitting) Bowels move fine    Objective:   Physical Exam Constitutional:      Appearance: Normal appearance.  Cardiovascular:     Rate and Rhythm: Normal rate and regular rhythm.     Heart sounds: No murmur heard.   No gallop.  Pulmonary:     Effort: Pulmonary effort is normal.     Breath sounds: Normal breath sounds. No wheezing or rales.  Abdominal:     Palpations: Abdomen is soft.     Tenderness: There is no abdominal tenderness.  Musculoskeletal:     Cervical back: Neck supple.     Right lower leg: No edema.     Left lower leg: No edema.  Lymphadenopathy:     Cervical: No cervical adenopathy.  Neurological:     Mental Status: He is alert.  Psychiatric:        Mood and Affect: Mood normal.        Behavior: Behavior normal.           Assessment & Plan:

## 2021-03-08 NOTE — Assessment & Plan Note (Signed)
Is on losartan No action given his age

## 2021-03-08 NOTE — Assessment & Plan Note (Signed)
No apparent progression and no loss of function Observation only AL a good idea

## 2021-06-01 ENCOUNTER — Non-Acute Institutional Stay: Payer: Medicare Other | Admitting: Internal Medicine

## 2021-06-01 ENCOUNTER — Encounter: Payer: Self-pay | Admitting: Internal Medicine

## 2021-06-01 DIAGNOSIS — N1832 Chronic kidney disease, stage 3b: Secondary | ICD-10-CM

## 2021-06-01 DIAGNOSIS — M159 Polyosteoarthritis, unspecified: Secondary | ICD-10-CM | POA: Diagnosis not present

## 2021-06-01 DIAGNOSIS — G3184 Mild cognitive impairment, so stated: Secondary | ICD-10-CM

## 2021-06-01 DIAGNOSIS — M150 Primary generalized (osteo)arthritis: Secondary | ICD-10-CM

## 2021-06-01 DIAGNOSIS — K219 Gastro-esophageal reflux disease without esophagitis: Secondary | ICD-10-CM | POA: Diagnosis not present

## 2021-06-01 DIAGNOSIS — E7800 Pure hypercholesterolemia, unspecified: Secondary | ICD-10-CM

## 2021-06-01 DIAGNOSIS — I10 Essential (primary) hypertension: Secondary | ICD-10-CM

## 2021-06-01 NOTE — Progress Notes (Signed)
Subjective:    Patient ID: Frederick Simon, male    DOB: 08-Sep-1926, 87 y.o.   MRN: [id number]  HPI  Resident seen in [address] RN reports he has been covid positive since 12/6 but asymptomatic at this time. He is sleeping well.  His appetite is good.  His weight has been stable.  He is independent with ADLs.  He walks with a cane.  He denies chest pain, shortness of breath or any joint pain at this time.  He reports his bowels are moving normally.  He denies any urinary incontinence.  He reports his mood is great.  He is going to see his wife today who has not been doing well recently and recently placed on hospice.  GERD:  Currently not an issue. He is not taking any medications for this at this time.  HLD: He denies myalgias on Simvastatin.  HTN: His BP today is 138/67. He is taking Losartan and HCTZ as prescribd.  OA: He denies any joint pain at this time. He is not taking any medications OTC for this.  CKD 3: His last creatinine was 1.33, GFR 33, 12/2020. He is on Losartan for renal protection.  MCI: Mild cognitive, stable functional needs. Appropriate for ALF care.  Review of Systems     Past Medical History:  Diagnosis Date   Allergic rhinitis    Gastroesophageal reflux disease    Hyperlipidemia    Hypertension     Current Outpatient Medications  Medication Sig Dispense Refill   aspirin EC 81 MG tablet Take 81 mg by mouth daily.     Cholecalciferol (VITAMIN D) 50 MCG (2000 UT) CAPS Take by mouth.     fluticasone (FLONASE) 50 MCG/ACT nasal spray Place 2 sprays into both nostrils daily.     hydrochlorothiazide (HYDRODIURIL) 25 MG tablet Take 25 mg by mouth daily.     losartan (COZAAR) 100 MG tablet Take 100 mg by mouth daily.     simvastatin (ZOCOR) 20 MG tablet Take 20 mg by mouth daily.     vitamin B-12 (CYANOCOBALAMIN) 1000 MCG tablet Take 1,000 mcg by mouth daily.     No current facility-administered medications for this visit.    No Known Allergies  Family  History  Problem Relation Age of Onset   Cancer Mother    Hypertension Mother    Hypertension Father    Stroke Father    Lupus Sister     Social History   Socioeconomic History   Marital status: Married    Spouse name: Not on file   Number of children: 4   Years of education: Not on file   Highest education level: Not on file  Occupational History   Occupation: Scientist, forensic: [employer]  Tobacco Use   Smoking status: Former   Smokeless tobacco: Never   Tobacco comments:    quit 1980's  Substance and Sexual Activity   Alcohol use: No   Drug use: No   Sexual activity: Not on file  Other Topics Concern   Not on file  Social History Narrative   4 daughters      Has advanced directives   Daughter Jana Half is health care POA   Requests DNR--done 08/03/20   No feeding tube if cognitively unaware   Social Determinants of Health   Financial Resource Strain: Not on file  Food Insecurity: Not on file  Transportation Needs: Not on file  Physical Activity: Not on file  Stress: Not  on file  Social Connections: Not on file  Intimate Partner Violence: Not on file     Constitutional: Denies fever, malaise, fatigue, headache or abrupt weight changes.  HEENT: Denies eye pain, eye redness, ear pain, ringing in the ears, wax buildup, runny nose, nasal congestion, bloody nose, or sore throat. Respiratory: Denies difficulty breathing, shortness of breath, cough or sputum production.   Cardiovascular: Denies chest pain, chest tightness, palpitations or swelling in the hands or feet.  Gastrointestinal: Denies abdominal pain, bloating, constipation, diarrhea or blood in the stool.  GU: Denies urgency, frequency, pain with urination, burning sensation, blood in urine, odor or discharge. Musculoskeletal: Denies decrease in range of motion, difficulty with gait, muscle pain or joint pain and swelling.  Skin: Denies redness, rashes, lesions or ulcercations.  Neurological: Pt  reports mild difficulty with memory. Denies dizziness, difficulty with speech or problems with balance and coordination.  Psych: Denies anxiety, depression, SI/HI.  No other specific complaints in a complete review of systems (except as listed in HPI above).  Objective:   Physical Exam  BP 138/67    Pulse 68    Temp 97.7 F (36.5 C)    Resp 17    Wt 193 lb (87.5 kg)    SpO2 97%    BMI 28.50 kg/m  Wt Readings from Last 3 Encounters:  06/01/21 193 lb (87.5 kg)  03/08/21 192 lb 12.8 oz (87.5 kg)  12/30/20 188 lb 9.6 oz (85.5 kg)    General: Appears his stated age, well developed, well nourished in NAD. Skin: Warm, dry and intact.  HEENT: Head: normal shape and size;  Cardiovascular: Normal rate and rhythm.  Pulmonary/Chest: Normal effort and positive vesicular breath sounds. No respiratory distress. No wheezes, rales or ronchi noted.  Abdomen: Soft and nontender. Normal bowel sounds.  Neurological: Alert and oriented.  Psychiatric: Mood and affect normal.     BMET    Component Value Date/Time   NA 140 10/27/2008 1105   K 4.4 10/27/2008 1105   CL 104 10/27/2008 1105   CO2 29 10/27/2008 1105   GLUCOSE 111 (H) 10/27/2008 1105   BUN 20 10/27/2008 1105   CREATININE 1.17 10/27/2008 1105   CALCIUM 9.3 10/27/2008 1105   GFRNONAA 60 (L) 10/27/2008 1105   GFRAA  10/27/2008 1105    >60        The eGFR has been calculated using the MDRD equation. This calculation has not been validated in all clinical situations. eGFR's persistently <60 mL/min signify possible Chronic Kidney Disease.    Lipid Panel  No results found for: CHOL, TRIG, HDL, CHOLHDL, VLDL, LDLCALC  CBC    Component Value Date/Time   HGB 15.6 11/01/2008 0810    Hgb A1C No results found for: HGBA1C          Assessment & Plan:    Webb Silversmith, NP This visit occurred during the SARS-CoV-2 public health emergency.  Safety protocols were in place, including screening questions prior to the visit,  additional usage of staff PPE, and extensive cleaning of exam room while observing appropriate contact time as indicated for disinfecting solutions.

## 2021-06-01 NOTE — Assessment & Plan Note (Signed)
Appreciate ALF care 

## 2021-06-01 NOTE — Assessment & Plan Note (Signed)
Continue Simvastatin. 

## 2021-06-01 NOTE — Assessment & Plan Note (Signed)
Currently not an issue off meds 

## 2021-06-01 NOTE — Assessment & Plan Note (Signed)
Controlled on Losartan and HCTZ Monitor

## 2021-06-01 NOTE — Assessment & Plan Note (Signed)
Denies joint pain at this time

## 2021-06-01 NOTE — Patient Instructions (Signed)

## 2021-06-01 NOTE — Assessment & Plan Note (Signed)
Continue Losartan for renal protection Monitor GFR

## 2021-08-24 ENCOUNTER — Non-Acute Institutional Stay (INDEPENDENT_AMBULATORY_CARE_PROVIDER_SITE_OTHER): Payer: Medicare Other | Admitting: Internal Medicine

## 2021-08-24 ENCOUNTER — Encounter: Payer: Self-pay | Admitting: Internal Medicine

## 2021-08-24 VITALS — BP 134/74 | HR 63 | Temp 97.7°F | Resp 17 | Wt 194.4 lb

## 2021-08-24 DIAGNOSIS — R0602 Shortness of breath: Secondary | ICD-10-CM | POA: Diagnosis not present

## 2021-08-24 MED ORDER — ALBUTEROL SULFATE HFA 108 (90 BASE) MCG/ACT IN AERS: 1.0000 | INHALATION_SPRAY | Freq: Four times a day (QID) | RESPIRATORY_TRACT | 0 refills | Status: DC | PRN

## 2021-08-24 NOTE — Progress Notes (Signed)
? ?Subjective:  ? ? Patient ID: Frederick Simon, male    DOB: [birth year], 88 y.o.   MRN: [id number] ? ?HPI ? ?Resident seen [address] ?Daughter concerned about shortness of breath. Resident reports he does get SOB from time to time, almost feels like he can't take a deep breath. This is not associated with any chest pain, dizziness, sweating, nausea or vomiting. He reports after he sits down, it takes him a while to catch his breath. RN does not feel like he seems SOB with exertion and they have not noted any wheezing. He has no history of COPD or heart failure. ? ?Review of Systems ? ?   ?Past Medical History:  ?Diagnosis Date  ? Allergic rhinitis   ? Gastroesophageal reflux disease   ? Hyperlipidemia   ? Hypertension   ? ? ?Current Outpatient Medications  ?Medication Sig Dispense Refill  ? aspirin EC 81 MG tablet Take 81 mg by mouth daily.    ? Cholecalciferol (VITAMIN D) 50 MCG (2000 UT) CAPS Take by mouth.    ? fluticasone (FLONASE) 50 MCG/ACT nasal spray Place 2 sprays into both nostrils daily.    ? hydrochlorothiazide (HYDRODIURIL) 25 MG tablet Take 25 mg by mouth daily.    ? losartan (COZAAR) 100 MG tablet Take 100 mg by mouth daily.    ? simvastatin (ZOCOR) 20 MG tablet Take 20 mg by mouth daily.    ? vitamin B-12 (CYANOCOBALAMIN) 1000 MCG tablet Take 1,000 mcg by mouth daily.    ? ?No current facility-administered medications for this visit.  ? ? ?No Known Allergies ? ?Family History  ?Problem Relation Age of Onset  ? Cancer Mother   ? Hypertension Mother   ? Hypertension Father   ? Stroke Father   ? Lupus Sister   ? ? ?Social History  ? ?Socioeconomic History  ? Marital status: Widowed  ?  Spouse name: Not on file  ? Number of children: 4  ? Years of education: Not on file  ? Highest education level: Not on file  ?Occupational History  ? Occupation: Art gallery manager  ?  Employer: [employer]  ?Tobacco Use  ? Smoking status: Former  ? Smokeless tobacco: Never  ? Tobacco comments:  ?  quit 1980's  ?Substance and  Sexual Activity  ? Alcohol use: No  ? Drug use: No  ? Sexual activity: Not on file  ?Other Topics Concern  ? Not on file  ?Social History Narrative  ? 4 daughters  ?   ? Has advanced directives  ? Daughter Jana Half is health care POA  ? Requests DNR--done 08/03/20  ? No feeding tube if cognitively unaware  ? ?Social Determinants of Health  ? ?Financial Resource Strain: Not on file  ?Food Insecurity: Not on file  ?Transportation Needs: Not on file  ?Physical Activity: Not on file  ?Stress: Not on file  ?Social Connections: Not on file  ?Intimate Partner Violence: Not on file  ? ? ? ?Constitutional: Denies fever, malaise, fatigue, headache or abrupt weight changes.  ?HEENT: Denies eye pain, eye redness, ear pain, ringing in the ears, wax buildup, runny nose, nasal congestion, bloody nose, or sore throat. ?Respiratory: Pt reports intermittent shortness of breath. Denies difficulty breathing, cough or sputum production.   ?Cardiovascular: Denies chest pain, chest tightness, palpitations or swelling in the hands or feet.  ?Gastrointestinal: Denies abdominal pain, bloating, constipation, diarrhea or blood in the stool.  ? ?No other specific complaints in a complete review of  systems (except as listed in HPI above). ? ?Objective:  ? Physical Exam ? ? ?BP 134/74   Pulse 63   Temp 97.7 ?F (36.5 ?C)   Resp 17   Wt 194 lb 6.4 oz (88.2 kg)   SpO2 95%   BMI 28.71 kg/m?  ?Wt Readings from Last 3 Encounters:  ?08/24/21 194 lb 6.4 oz (88.2 kg)  ?06/01/21 193 lb (87.5 kg)  ?03/08/21 192 lb 12.8 oz (87.5 kg)  ? ? ?General: Appears his stated age, well developed, well nourished in NAD. ?Neck:  No JVD ?Cardiovascular: Normal rate and rhythm. S1,S2 noted.  No murmur, rubs or gallops noted. No JVD or BLE edema.  ?Pulmonary/Chest: Normal effort and positive vesicular breath sounds. No respiratory distress. No wheezes, rales or ronchi noted.  ?Musculoskeletal: No difficulty with gait.  ?Neurological: Alert and oriented.  ? ?BMET ?    ?Component Value Date/Time  ? NA 140 10/27/2008 1105  ? K 4.4 10/27/2008 1105  ? CL 104 10/27/2008 1105  ? CO2 29 10/27/2008 1105  ? GLUCOSE 111 (H) 10/27/2008 1105  ? BUN 20 10/27/2008 1105  ? CREATININE 1.17 10/27/2008 1105  ? CALCIUM 9.3 10/27/2008 1105  ? GFRNONAA 60 (L) 10/27/2008 1105  ? GFRAA  10/27/2008 1105  ?  >60        ?The eGFR has been calculated ?using the MDRD equation. ?This calculation has not been ?validated in all clinical ?situations. ?eGFR's persistently ?<60 mL/min signify ?possible Chronic Kidney Disease.  ? ? ?Lipid Panel  ?No results found for: CHOL, TRIG, HDL, CHOLHDL, VLDL, LDLCALC ? ?CBC ?   ?Component Value Date/Time  ? HGB 15.6 11/01/2008 0810  ? ? ?Hgb A1C ?No results found for: HGBA1C ? ? ? ? ? ?   ?Assessment & Plan:  ? ?Intermittent Shortness of Breath: ? ?Will trial Albuterol 90 mcg 1-2 puffs Q6H prn for SOB ? ?Will reassess as needed ?Webb Silversmith, NP ?This visit occurred during the SARS-CoV-2 public health emergency.  Safety protocols were in place, including screening questions prior to the visit, additional usage of staff PPE, and extensive cleaning of exam room while observing appropriate contact time as indicated for disinfecting solutions.  ? ? ?

## 2021-09-06 ENCOUNTER — Encounter: Payer: Self-pay | Admitting: Internal Medicine

## 2021-09-06 ENCOUNTER — Ambulatory Visit (INDEPENDENT_AMBULATORY_CARE_PROVIDER_SITE_OTHER): Payer: Medicare Other | Admitting: Internal Medicine

## 2021-09-06 DIAGNOSIS — N1832 Chronic kidney disease, stage 3b: Secondary | ICD-10-CM

## 2021-09-06 DIAGNOSIS — J301 Allergic rhinitis due to pollen: Secondary | ICD-10-CM

## 2021-09-06 DIAGNOSIS — I10 Essential (primary) hypertension: Secondary | ICD-10-CM | POA: Diagnosis not present

## 2021-09-06 DIAGNOSIS — M150 Primary generalized (osteo)arthritis: Secondary | ICD-10-CM

## 2021-09-06 DIAGNOSIS — K219 Gastro-esophageal reflux disease without esophagitis: Secondary | ICD-10-CM

## 2021-09-06 DIAGNOSIS — G3184 Mild cognitive impairment, so stated: Secondary | ICD-10-CM

## 2021-09-06 DIAGNOSIS — M159 Polyosteoarthritis, unspecified: Secondary | ICD-10-CM

## 2021-09-06 NOTE — Assessment & Plan Note (Signed)
Has now started flonase daily and this has helped his symptoms ?

## 2021-09-06 NOTE — Assessment & Plan Note (Signed)
BP Readings from Last 3 Encounters:  ?09/06/21 (!) 144/80  ?08/24/21 134/74  ?06/01/21 138/67  ? ?Reasonable control for age on losartan 100 and HCTZ 25 ? ?

## 2021-09-06 NOTE — Assessment & Plan Note (Signed)
Ongoing mild memory problems ?Has maintained functional independence here in assisted living ?

## 2021-09-06 NOTE — Assessment & Plan Note (Signed)
If has more regular symptoms, would try famotidine 20mg  daily ?

## 2021-09-06 NOTE — Progress Notes (Signed)
? ?Subjective:  ? ? Patient ID: Frederick Simon, male    DOB: 15-Oct-1926, 88 y.o.   MRN: [id number] ? ?HPI ?Visit in assisted living apartment for review of chronic medical conditions ?Reviewed status with Theron Arista RN ? ?Ongoing mild memory problems (even forgot when his wife died--and it was recent) ?Remains functionally independent--no help needed with ADLs ?Still continent ? ?Known CKD ?Last GFR 33--due again in May ? ?Mild joint pains---uses tylenol occasionally (but actually more for headache) ?Some shoulder pains as well ? ?Recently started flonase for allergies ?Breathing is better now ? ?No chest pain ?No SOB ?Rare dizziness if he gets up to quick. No syncope ?No edema ? ?Will have occasional heartburn ?Chews "a mint" and it relieves (couple times a week or so) ?No dysphagia ? ?Current Outpatient Medications on File Prior to Visit  ?Medication Sig Dispense Refill  ? albuterol (VENTOLIN HFA) 108 (90 Base) MCG/ACT inhaler Inhale 1-2 puffs into the lungs every 6 (six) hours as needed for wheezing or shortness of breath. 8 g 0  ? aspirin EC 81 MG tablet Take 81 mg by mouth daily.    ? Cholecalciferol (VITAMIN D) 50 MCG (2000 UT) CAPS Take by mouth.    ? fluticasone (FLONASE) 50 MCG/ACT nasal spray Place 2 sprays into both nostrils daily.    ? hydrochlorothiazide (HYDRODIURIL) 25 MG tablet Take 25 mg by mouth daily.    ? losartan (COZAAR) 100 MG tablet Take 100 mg by mouth daily.    ? simvastatin (ZOCOR) 20 MG tablet Take 20 mg by mouth daily.    ? vitamin B-12 (CYANOCOBALAMIN) 1000 MCG tablet Take 1,000 mcg by mouth daily.    ? ?No current facility-administered medications on file prior to visit.  ? ? ?No Known Allergies ? ?Past Medical History:  ?Diagnosis Date  ? Allergic rhinitis   ? Gastroesophageal reflux disease   ? Hyperlipidemia   ? Hypertension   ? ? ?Past Surgical History:  ?Procedure Laterality Date  ? BACK SURGERY  May 1975   ? Dr. Leslee Home  ? BACK SURGERY  Feb 2004  ? Dr. Newell Coral   ? HAND SURGERY   2008,2010  ? Dr. Priscille Kluver  ? NOSE SURGERY  1989  ? Dr. Lyman Bishop  ? SHOULDER SURGERY  2008,2009  ? Dr. Priscille Kluver  ? ? ?Family History  ?Problem Relation Age of Onset  ? Cancer Mother   ? Hypertension Mother   ? Hypertension Father   ? Stroke Father   ? Lupus Sister   ? ? ?Social History  ? ?Socioeconomic History  ? Marital status: Widowed  ?  Spouse name: Not on file  ? Number of children: 4  ? Years of education: Not on file  ? Highest education level: Not on file  ?Occupational History  ? Occupation: Acupuncturist  ?  Employer: [employer]  ?Tobacco Use  ? Smoking status: Former  ? Smokeless tobacco: Never  ? Tobacco comments:  ?  quit 1980's  ?Substance and Sexual Activity  ? Alcohol use: No  ? Drug use: No  ? Sexual activity: Not on file  ?Other Topics Concern  ? Not on file  ?Social History Narrative  ? 4 daughters  ?   ? Has advanced directives  ? Daughter [name] is health care POA  ? Requests DNR--done 08/03/20  ? No feeding tube if cognitively unaware  ? ?Social Determinants of Health  ? ?Financial Resource Strain: Not on file  ?Food Insecurity: Not on file  ?  Transportation Needs: Not on file  ?Physical Activity: Not on file  ?Stress: Not on file  ?Social Connections: Not on file  ?Intimate Partner Violence: Not on file  ? ?Review of Systems ?Appetite is good ?Weight fairly stable ?Sleeps well ?Bowels move fine ?No skin rash or ulcers ? ?   ?Objective:  ? Physical Exam ?Constitutional:   ?   Appearance: Normal appearance.  ?Cardiovascular:  ?   Rate and Rhythm: Normal rate and regular rhythm.  ?   Heart sounds: No murmur heard. ?  No gallop.  ?Pulmonary:  ?   Effort: Pulmonary effort is normal.  ?   Breath sounds: Normal breath sounds. No wheezing or rales.  ?Abdominal:  ?   Palpations: Abdomen is soft.  ?   Tenderness: There is no abdominal tenderness.  ?Musculoskeletal:  ?   Cervical back: Neck supple.  ?   Right lower leg: No edema.  ?   Left lower leg: No edema.  ?Lymphadenopathy:  ?   Cervical: No cervical  adenopathy.  ?Skin: ?   Findings: No rash.  ?Neurological:  ?   Mental Status: He is alert.  ?Psychiatric:     ?   Mood and Affect: Mood normal.     ?   Behavior: Behavior normal.  ?  ? ? ? ? ?   ?Assessment & Plan:  ? ?

## 2021-09-06 NOTE — Assessment & Plan Note (Signed)
Last GFR 33 ?Is on losartan 100mg  daily---labs due again in May ?

## 2021-09-06 NOTE — Assessment & Plan Note (Signed)
Uses tylenol only occasionally ?

## 2021-10-15 LAB — BASIC METABOLIC PANEL
BUN: 40 — AB (ref 4–21)
CO2: 25 — AB (ref 13–22)
Chloride: 106 (ref 99–108)
Creatinine: 2.2 — AB (ref 0.6–1.3)
Glucose: 109
Potassium: 4 mEq/L (ref 3.5–5.1)
Sodium: 139 (ref 137–147)

## 2021-10-15 LAB — LIPID PANEL
Cholesterol: 166 (ref 0–200)
HDL: 44 (ref 35–70)
LDL Cholesterol: 98
Triglycerides: 138 (ref 40–160)

## 2021-10-15 LAB — CBC AND DIFFERENTIAL
HCT: 43 (ref 41–53)
Hemoglobin: 14.5 (ref 13.5–17.5)
Neutrophils Absolute: 3377
Platelets: 195 10*3/uL (ref 150–400)
WBC: 7.2

## 2021-10-15 LAB — COMPREHENSIVE METABOLIC PANEL
Albumin: 4.3 (ref 3.5–5.0)
Calcium: 9.4 (ref 8.7–10.7)
Globulin: 2.7
eGFR: 28

## 2021-10-15 LAB — HEPATIC FUNCTION PANEL
ALT: 25 U/L (ref 10–40)
AST: 23 (ref 14–40)
Alkaline Phosphatase: 50 (ref 25–125)
Bilirubin, Total: 0.7

## 2021-10-15 LAB — CBC: RBC: 4.72 (ref 3.87–5.11)

## 2021-11-28 ENCOUNTER — Non-Acute Institutional Stay: Payer: Medicare Other | Admitting: Internal Medicine

## 2021-11-28 DIAGNOSIS — N1832 Chronic kidney disease, stage 3b: Secondary | ICD-10-CM

## 2021-11-28 DIAGNOSIS — I10 Essential (primary) hypertension: Secondary | ICD-10-CM | POA: Diagnosis not present

## 2021-11-28 DIAGNOSIS — G3184 Mild cognitive impairment, so stated: Secondary | ICD-10-CM

## 2021-11-28 DIAGNOSIS — K219 Gastro-esophageal reflux disease without esophagitis: Secondary | ICD-10-CM

## 2021-11-28 DIAGNOSIS — M159 Polyosteoarthritis, unspecified: Secondary | ICD-10-CM

## 2021-11-28 DIAGNOSIS — E7800 Pure hypercholesterolemia, unspecified: Secondary | ICD-10-CM

## 2021-11-29 ENCOUNTER — Encounter: Payer: Self-pay | Admitting: Internal Medicine

## 2021-11-29 MED ORDER — OMEPRAZOLE 20 MG PO CPDR: 20.0000 mg | DELAYED_RELEASE_CAPSULE | Freq: Every day | ORAL | 3 refills | Status: DC

## 2021-11-29 NOTE — Assessment & Plan Note (Signed)
Encouraged him to stay active Not currently taking any medications for this

## 2021-11-29 NOTE — Assessment & Plan Note (Signed)
We will start omeprazole 20 mg daily given his frequent nausea and chest pain

## 2021-11-29 NOTE — Assessment & Plan Note (Signed)
Continue simvastatin. 

## 2021-11-29 NOTE — Assessment & Plan Note (Signed)
Kidney function reviewed Continue losartan for renal protection

## 2021-11-29 NOTE — Assessment & Plan Note (Signed)
Low lately We will D/C HCTZ Continue losartan but consider dose reduction if blood pressures remain low

## 2021-11-29 NOTE — Assessment & Plan Note (Signed)
Continue aspirin Appreciate ALF care

## 2022-02-07 ENCOUNTER — Other Ambulatory Visit: Payer: Self-pay

## 2022-02-07 ENCOUNTER — Inpatient Hospital Stay
Admission: EM | Admit: 2022-02-07 | Discharge: 2022-02-15 | DRG: 871 | Disposition: A | Discharge status: Skilled Nursing Facility | Payer: Medicare Other | Source: Skilled Nursing Facility | Attending: Internal Medicine | Admitting: Internal Medicine

## 2022-02-07 ENCOUNTER — Encounter: Payer: Self-pay | Admitting: Emergency Medicine

## 2022-02-07 ENCOUNTER — Emergency Department: Payer: Medicare Other

## 2022-02-07 DIAGNOSIS — R6520 Severe sepsis without septic shock: Secondary | ICD-10-CM | POA: Diagnosis present

## 2022-02-07 DIAGNOSIS — K219 Gastro-esophageal reflux disease without esophagitis: Secondary | ICD-10-CM | POA: Diagnosis present

## 2022-02-07 DIAGNOSIS — N1832 Chronic kidney disease, stage 3b: Secondary | ICD-10-CM | POA: Diagnosis present

## 2022-02-07 DIAGNOSIS — Z79899 Other long term (current) drug therapy: Secondary | ICD-10-CM | POA: Diagnosis not present

## 2022-02-07 DIAGNOSIS — R778 Other specified abnormalities of plasma proteins: Secondary | ICD-10-CM | POA: Diagnosis not present

## 2022-02-07 DIAGNOSIS — R6883 Chills (without fever): Secondary | ICD-10-CM | POA: Diagnosis present

## 2022-02-07 DIAGNOSIS — R531 Weakness: Secondary | ICD-10-CM

## 2022-02-07 DIAGNOSIS — D649 Anemia, unspecified: Secondary | ICD-10-CM | POA: Diagnosis present

## 2022-02-07 DIAGNOSIS — R112 Nausea with vomiting, unspecified: Secondary | ICD-10-CM

## 2022-02-07 DIAGNOSIS — J189 Pneumonia, unspecified organism: Secondary | ICD-10-CM

## 2022-02-07 DIAGNOSIS — E038 Other specified hypothyroidism: Secondary | ICD-10-CM | POA: Diagnosis present

## 2022-02-07 DIAGNOSIS — I5021 Acute systolic (congestive) heart failure: Secondary | ICD-10-CM | POA: Diagnosis not present

## 2022-02-07 DIAGNOSIS — Z7982 Long term (current) use of aspirin: Secondary | ICD-10-CM

## 2022-02-07 DIAGNOSIS — Z8249 Family history of ischemic heart disease and other diseases of the circulatory system: Secondary | ICD-10-CM | POA: Diagnosis not present

## 2022-02-07 DIAGNOSIS — Z823 Family history of stroke: Secondary | ICD-10-CM

## 2022-02-07 DIAGNOSIS — R7303 Prediabetes: Secondary | ICD-10-CM | POA: Diagnosis present

## 2022-02-07 DIAGNOSIS — R7309 Other abnormal glucose: Secondary | ICD-10-CM | POA: Diagnosis present

## 2022-02-07 DIAGNOSIS — I248 Other forms of acute ischemic heart disease: Secondary | ICD-10-CM | POA: Diagnosis present

## 2022-02-07 DIAGNOSIS — Z809 Family history of malignant neoplasm, unspecified: Secondary | ICD-10-CM | POA: Diagnosis not present

## 2022-02-07 DIAGNOSIS — Z20822 Contact with and (suspected) exposure to covid-19: Secondary | ICD-10-CM | POA: Diagnosis present

## 2022-02-07 DIAGNOSIS — Z832 Family history of diseases of the blood and blood-forming organs and certain disorders involving the immune mechanism: Secondary | ICD-10-CM

## 2022-02-07 DIAGNOSIS — D631 Anemia in chronic kidney disease: Secondary | ICD-10-CM | POA: Diagnosis present

## 2022-02-07 DIAGNOSIS — Z66 Do not resuscitate: Secondary | ICD-10-CM | POA: Diagnosis present

## 2022-02-07 DIAGNOSIS — H9190 Unspecified hearing loss, unspecified ear: Secondary | ICD-10-CM | POA: Diagnosis present

## 2022-02-07 DIAGNOSIS — J9601 Acute respiratory failure with hypoxia: Secondary | ICD-10-CM | POA: Diagnosis not present

## 2022-02-07 DIAGNOSIS — Z87891 Personal history of nicotine dependence: Secondary | ICD-10-CM | POA: Diagnosis not present

## 2022-02-07 DIAGNOSIS — E785 Hyperlipidemia, unspecified: Secondary | ICD-10-CM | POA: Diagnosis present

## 2022-02-07 DIAGNOSIS — E876 Hypokalemia: Secondary | ICD-10-CM | POA: Diagnosis not present

## 2022-02-07 DIAGNOSIS — I10 Essential (primary) hypertension: Secondary | ICD-10-CM | POA: Diagnosis present

## 2022-02-07 DIAGNOSIS — K5900 Constipation, unspecified: Secondary | ICD-10-CM | POA: Diagnosis not present

## 2022-02-07 DIAGNOSIS — N179 Acute kidney failure, unspecified: Secondary | ICD-10-CM | POA: Diagnosis present

## 2022-02-07 DIAGNOSIS — R946 Abnormal results of thyroid function studies: Secondary | ICD-10-CM | POA: Diagnosis present

## 2022-02-07 DIAGNOSIS — A419 Sepsis, unspecified organism: Secondary | ICD-10-CM | POA: Diagnosis present

## 2022-02-07 DIAGNOSIS — R7989 Other specified abnormal findings of blood chemistry: Secondary | ICD-10-CM | POA: Diagnosis present

## 2022-02-07 LAB — BLOOD GAS, VENOUS
Acid-base deficit: 0.9 mmol/L (ref 0.0–2.0)
Bicarbonate: 23.5 mmol/L (ref 20.0–28.0)
O2 Saturation: 64.6 %
Patient temperature: 37
pCO2, Ven: 37 mmHg — ABNORMAL LOW (ref 44–60)
pH, Ven: 7.41 (ref 7.25–7.43)
pO2, Ven: 36 mmHg (ref 32–45)

## 2022-02-07 LAB — URINALYSIS, ROUTINE W REFLEX MICROSCOPIC
Bilirubin Urine: NEGATIVE
Glucose, UA: 50 mg/dL — AB
Ketones, ur: NEGATIVE mg/dL
Leukocytes,Ua: NEGATIVE
Nitrite: NEGATIVE
Protein, ur: 100 mg/dL — AB
Specific Gravity, Urine: 1.02 (ref 1.005–1.030)
pH: 5 (ref 5.0–8.0)

## 2022-02-07 LAB — CBC WITH DIFFERENTIAL/PLATELET
Abs Immature Granulocytes: 0.1 10*3/uL — ABNORMAL HIGH (ref 0.00–0.07)
Basophils Absolute: 0.1 10*3/uL (ref 0.0–0.1)
Basophils Relative: 0 %
Eosinophils Absolute: 0 10*3/uL (ref 0.0–0.5)
Eosinophils Relative: 0 %
HCT: 35.9 % — ABNORMAL LOW (ref 39.0–52.0)
Hemoglobin: 11.7 g/dL — ABNORMAL LOW (ref 13.0–17.0)
Immature Granulocytes: 1 %
Lymphocytes Relative: 7 %
Lymphs Abs: 0.8 10*3/uL (ref 0.7–4.0)
MCH: 30.2 pg (ref 26.0–34.0)
MCHC: 32.6 g/dL (ref 30.0–36.0)
MCV: 92.5 fL (ref 80.0–100.0)
Monocytes Absolute: 1.1 10*3/uL — ABNORMAL HIGH (ref 0.1–1.0)
Monocytes Relative: 9 %
Neutro Abs: 10 10*3/uL — ABNORMAL HIGH (ref 1.7–7.7)
Neutrophils Relative %: 83 %
Platelets: 147 10*3/uL — ABNORMAL LOW (ref 150–400)
RBC: 3.88 MIL/uL — ABNORMAL LOW (ref 4.22–5.81)
RDW: 13.3 % (ref 11.5–15.5)
WBC: 12 10*3/uL — ABNORMAL HIGH (ref 4.0–10.5)
nRBC: 0 % (ref 0.0–0.2)

## 2022-02-07 LAB — CBC
HCT: 40.6 % (ref 39.0–52.0)
Hemoglobin: 13.1 g/dL (ref 13.0–17.0)
MCH: 29.4 pg (ref 26.0–34.0)
MCHC: 32.3 g/dL (ref 30.0–36.0)
MCV: 91 fL (ref 80.0–100.0)
Platelets: 187 10*3/uL (ref 150–400)
RBC: 4.46 MIL/uL (ref 4.22–5.81)
RDW: 13.3 % (ref 11.5–15.5)
WBC: 15 10*3/uL — ABNORMAL HIGH (ref 4.0–10.5)
nRBC: 0 % (ref 0.0–0.2)

## 2022-02-07 LAB — COMPREHENSIVE METABOLIC PANEL
ALT: 20 U/L (ref 0–44)
AST: 28 U/L (ref 15–41)
Albumin: 4.2 g/dL (ref 3.5–5.0)
Alkaline Phosphatase: 56 U/L (ref 38–126)
Anion gap: 11 (ref 5–15)
BUN: 31 mg/dL — ABNORMAL HIGH (ref 8–23)
CO2: 20 mmol/L — ABNORMAL LOW (ref 22–32)
Calcium: 8.7 mg/dL — ABNORMAL LOW (ref 8.9–10.3)
Chloride: 107 mmol/L (ref 98–111)
Creatinine, Ser: 2.13 mg/dL — ABNORMAL HIGH (ref 0.61–1.24)
GFR, Estimated: 28 mL/min — ABNORMAL LOW (ref >60)
Glucose, Bld: 176 mg/dL — ABNORMAL HIGH (ref 70–99)
Potassium: 3.9 mmol/L (ref 3.5–5.1)
Sodium: 138 mmol/L (ref 135–145)
Total Bilirubin: 1.3 mg/dL — ABNORMAL HIGH (ref 0.3–1.2)
Total Protein: 7.8 g/dL (ref 6.5–8.1)

## 2022-02-07 LAB — BRAIN NATRIURETIC PEPTIDE: B Natriuretic Peptide: 119.9 pg/mL — ABNORMAL HIGH (ref 0.0–100.0)

## 2022-02-07 LAB — TYPE AND SCREEN
ABO/RH(D): O POS
Antibody Screen: NEGATIVE

## 2022-02-07 LAB — TSH: TSH: 0.091 u[IU]/mL — ABNORMAL LOW (ref 0.350–4.500)

## 2022-02-07 LAB — LACTIC ACID, PLASMA
Lactic Acid, Venous: 1.7 mmol/L (ref 0.5–1.9)
Lactic Acid, Venous: 1.9 mmol/L (ref 0.5–1.9)

## 2022-02-07 LAB — D-DIMER, QUANTITATIVE: D-Dimer, Quant: 1.4 ug/mL-FEU — ABNORMAL HIGH (ref 0.00–0.50)

## 2022-02-07 LAB — PROCALCITONIN: Procalcitonin: 0.58 ng/mL

## 2022-02-07 LAB — T4, FREE: Free T4: 0.84 ng/dL (ref 0.61–1.12)

## 2022-02-07 LAB — TROPONIN I (HIGH SENSITIVITY)
Troponin I (High Sensitivity): 23 ng/L — ABNORMAL HIGH (ref <18)
Troponin I (High Sensitivity): 39 ng/L — ABNORMAL HIGH (ref <18)

## 2022-02-07 LAB — LIPASE, BLOOD: Lipase: 31 U/L (ref 11–51)

## 2022-02-07 MED ORDER — METRONIDAZOLE 500 MG/100ML IV SOLN
500.0000 mg | Freq: Two times a day (BID) | INTRAVENOUS | Status: DC
Start: 2022-02-08 — End: 2022-02-09
  Administered 2022-02-08 – 2022-02-09 (×3): 500 mg via INTRAVENOUS
  Filled 2022-02-07 (×3): qty 100

## 2022-02-07 MED ORDER — LACTATED RINGERS IV BOLUS (SEPSIS)
1000.0000 mL | Freq: Once | INTRAVENOUS | Status: AC
  Administered 2022-02-07: 1000 mL via INTRAVENOUS

## 2022-02-07 MED ORDER — HYDRALAZINE HCL 20 MG/ML IJ SOLN: 10.0000 mg | Freq: Four times a day (QID) | INTRAMUSCULAR | Status: DC | PRN

## 2022-02-07 MED ORDER — MORPHINE SULFATE (PF) 2 MG/ML IV SOLN: 2.0000 mg | INTRAVENOUS | Status: DC | PRN

## 2022-02-07 MED ORDER — VANCOMYCIN HCL 750 MG/150ML IV SOLN
750.0000 mg | Freq: Once | INTRAVENOUS | Status: AC
  Administered 2022-02-07: 750 mg via INTRAVENOUS
  Filled 2022-02-07: qty 150

## 2022-02-07 MED ORDER — CEFEPIME HCL 2 G IV SOLR
2.0000 g | INTRAVENOUS | Status: DC
  Administered 2022-02-08: 2 g via INTRAVENOUS
  Filled 2022-02-07 (×2): qty 12.5

## 2022-02-07 MED ORDER — VANCOMYCIN VARIABLE DOSE PER UNSTABLE RENAL FUNCTION (PHARMACIST DOSING)
Status: DC
Start: 2022-02-07 — End: 2022-02-09

## 2022-02-07 MED ORDER — METRONIDAZOLE 500 MG/100ML IV SOLN
500.0000 mg | Freq: Once | INTRAVENOUS | Status: AC
  Administered 2022-02-07: 500 mg via INTRAVENOUS

## 2022-02-07 MED ORDER — SODIUM CHLORIDE 0.9 % IV SOLN
2.0000 g | Freq: Once | INTRAVENOUS | Status: AC
  Administered 2022-02-07: 2 g via INTRAVENOUS
  Filled 2022-02-07: qty 12.5

## 2022-02-07 MED ORDER — PANTOPRAZOLE SODIUM 40 MG IV SOLR
40.0000 mg | Freq: Two times a day (BID) | INTRAVENOUS | Status: DC
  Administered 2022-02-07 – 2022-02-09 (×4): 40 mg via INTRAVENOUS
  Filled 2022-02-07 (×4): qty 10

## 2022-02-07 MED ORDER — VANCOMYCIN HCL IN DEXTROSE 1-5 GM/200ML-% IV SOLN
1000.0000 mg | Freq: Once | INTRAVENOUS | Status: AC
  Administered 2022-02-07: 1000 mg via INTRAVENOUS
  Filled 2022-02-07: qty 200

## 2022-02-07 MED ORDER — ONDANSETRON HCL 4 MG/2ML IJ SOLN
4.0000 mg | Freq: Once | INTRAMUSCULAR | Status: AC
  Administered 2022-02-07: 4 mg via INTRAVENOUS

## 2022-02-07 MED ORDER — SODIUM CHLORIDE 0.9 % IV BOLUS
1000.0000 mL | Freq: Once | INTRAVENOUS | Status: AC
  Administered 2022-02-07: 1000 mL via INTRAVENOUS

## 2022-02-07 MED ORDER — LACTATED RINGERS IV SOLN: INTRAVENOUS | Status: AC

## 2022-02-07 MED ORDER — LACTATED RINGERS IV SOLN: INTRAVENOUS | Status: DC

## 2022-02-07 NOTE — H&P (Signed)
History and Physical    Frederick Simon YQI:[id number] DOB: 05/13/1927 DOA: 02/07/2022  PCP: Earnestine Mealing, MD    Patient coming from:  Assisted living.    Chief Complaint:  Chills.    HPI:  Frederick Simon is a 88 y.o. male seen in ed with complaints of started to have chills and went nurses and at facility and is weakness and AKI, pt had temp of 99.0. pt has no meds or any significant pmh other than htn.  He did reports nausea and generalized weakness to daughter.   Pt has past medical history of Htn.  ED Course:   Vitals:   02/07/22 2100 02/07/22 2200 02/07/22 2335 02/08/22 0029  BP:  (!) 195/160 137/71   Pulse:  (!) 118 69   Resp: 19 17 17    Temp:    (!) 100.9 F (38.3 C)  TempSrc:    Oral  SpO2:  92% 94%   Weight:      Height:      Pt meets sepsis criteria and lactic is 1.7, and source is unclear. Abd ct is negative for hydronephrosis.  Labs shows abnormal troponin and dimer. Ekg shows sinus tach and pac. Last travel was in [place] to beach [place].   Review of Systems:  Review of Systems  Constitutional:  Positive for chills.  Gastrointestinal:  Positive for nausea.  Neurological:  Positive for weakness.  All other systems reviewed and are negative.   Past Medical History:  Diagnosis Date   Allergic rhinitis    Gastroesophageal reflux disease    Hyperlipidemia    Hypertension     Past Surgical History:  Procedure Laterality Date   BACK SURGERY  May 1975    Dr. June 1975 SURGERY  Feb 2004   Dr. Mar 2004    HAND SURGERY  [phone]   Dr. [id number]   NOSE SURGERY  1989   Dr. Priscille Kluver SURGERY  [phone]   Dr. [id number]     reports that he has quit smoking. He has never used smokeless tobacco. He reports that he does not drink alcohol and does not use drugs.  No Known Allergies  Family History  Problem Relation Age of Onset   Cancer Mother    Hypertension Mother    Hypertension Father    Stroke Father    Lupus Sister      Prior to Admission medications   Medication Sig Start Date End Date Taking? Authorizing Provider  albuterol (VENTOLIN HFA) 108 (90 Base) MCG/ACT inhaler Inhale 1-2 puffs into the lungs every 6 (six) hours as needed for wheezing or shortness of breath. Patient not taking: Reported on 11/29/2021 08/24/21   08/26/21, NP  aspirin EC 81 MG tablet Take 81 mg by mouth daily.    [provider]  Cholecalciferol (VITAMIN D) 50 MCG (2000 UT) CAPS Take by mouth.    [provider]  fluticasone (FLONASE) 50 MCG/ACT nasal spray Place 2 sprays into both nostrils daily.    [provider]  losartan (COZAAR) 100 MG tablet Take 100 mg by mouth daily. 05/23/20   [provider]  omeprazole (PRILOSEC) 20 MG capsule Take 1 capsule (20 mg total) by mouth daily. 11/29/21   12/01/21, NP  simvastatin (ZOCOR) 20 MG tablet Take 20 mg by mouth daily.    [provider]  vitamin B-12 (CYANOCOBALAMIN) 1000 MCG tablet Take 1,000 mcg by mouth daily.    [provider]  Physical Exam: Vitals:   02/07/22 2100 02/07/22 2200 02/07/22 2335 02/08/22 0029  BP:  (!) 195/160 137/71   Pulse:  (!) 118 69   Resp: 19 17 17    Temp:    (!) 100.9 F (38.3 C)  TempSrc:    Oral  SpO2:  92% 94%   Weight:      Height:       Physical Exam Vitals and nursing note reviewed.  Constitutional:      General: He is not in acute distress.    Appearance: Normal appearance. He is not ill-appearing, toxic-appearing or diaphoretic.  HENT:     Head: Normocephalic and atraumatic.     Right Ear: Hearing and external ear normal.     Left Ear: Hearing and external ear normal.     Nose: Nose normal. No nasal deformity.     Mouth/Throat:     Lips: Pink.     Mouth: Mucous membranes are moist.     Tongue: No lesions.     Pharynx: Oropharynx is clear.  Eyes:     Extraocular Movements: Extraocular movements intact.     Pupils: Pupils are equal, round, and reactive to light.   Cardiovascular:     Rate and Rhythm: Normal rate and regular rhythm.     Pulses: Normal pulses.     Heart sounds: Normal heart sounds.  Pulmonary:     Effort: Pulmonary effort is normal.     Breath sounds: Normal breath sounds.  Abdominal:     General: Bowel sounds are normal. There is no distension.     Palpations: Abdomen is soft. There is no mass.     Tenderness: There is no abdominal tenderness. There is no guarding.     Hernia: No hernia is present.  Musculoskeletal:     Right lower leg: No edema.     Left lower leg: No edema.  Skin:    General: Skin is warm.  Neurological:     General: No focal deficit present.     Mental Status: He is alert and oriented to person, place, and time.     Cranial Nerves: Cranial nerves 2-12 are intact.     Motor: Motor function is intact.  Psychiatric:        Attention and Perception: Attention normal.        Mood and Affect: Mood normal.        Speech: Speech normal.        Behavior: Behavior normal. Behavior is cooperative.        Cognition and Memory: Cognition normal.     Labs on Admission: I have personally reviewed following labs and imaging studies Results for orders placed or performed during the hospital encounter of 02/07/22 (from the past 24 hour(s))  Lipase, blood     Status: None   Collection Time: 02/07/22  1:45 PM  Result Value Ref Range   Lipase 31 11 - 51 U/L  Comprehensive metabolic panel     Status: Abnormal   Collection Time: 02/07/22  1:45 PM  Result Value Ref Range   Sodium 138 135 - 145 mmol/L   Potassium 3.9 3.5 - 5.1 mmol/L   Chloride 107 98 - 111 mmol/L   CO2 20 (L) 22 - 32 mmol/L   Glucose, Bld 176 (H) 70 - 99 mg/dL   BUN 31 (H) 8 - 23 mg/dL   Creatinine, Ser 02/09/22 (H) 0.61 - 1.24 mg/dL   Calcium 8.7 (L) 8.9 - 10.3 mg/dL  Total Protein 7.8 6.5 - 8.1 g/dL   Albumin 4.2 3.5 - 5.0 g/dL   AST 28 15 - 41 U/L   ALT 20 0 - 44 U/L   Alkaline Phosphatase 56 38 - 126 U/L   Total Bilirubin 1.3 (H) 0.3 - 1.2  mg/dL   GFR, Estimated 28 (L) >60 mL/min   Anion gap 11 5 - 15  CBC     Status: Abnormal   Collection Time: 02/07/22  1:45 PM  Result Value Ref Range   WBC 15.0 (H) 4.0 - 10.5 K/uL   RBC 4.46 4.22 - 5.81 MIL/uL   Hemoglobin 13.1 13.0 - 17.0 g/dL   HCT 87.5 64.3 - 32.9 %   MCV 91.0 80.0 - 100.0 fL   MCH 29.4 26.0 - 34.0 pg   MCHC 32.3 30.0 - 36.0 g/dL   RDW 51.8 84.1 - 66.0 %   Platelets 187 150 - 400 K/uL   nRBC 0.0 0.0 - 0.2 %  Lactic acid, plasma     Status: None   Collection Time: 02/07/22  3:22 PM  Result Value Ref Range   Lactic Acid, Venous 1.7 0.5 - 1.9 mmol/L  Urinalysis, Routine w reflex microscopic     Status: Abnormal   Collection Time: 02/07/22  4:05 PM  Result Value Ref Range   Color, Urine YELLOW (A) YELLOW   APPearance HAZY (A) CLEAR   Specific Gravity, Urine 1.020 1.005 - 1.030   pH 5.0 5.0 - 8.0   Glucose, UA 50 (A) NEGATIVE mg/dL   Hgb urine dipstick MODERATE (A) NEGATIVE   Bilirubin Urine NEGATIVE NEGATIVE   Ketones, ur NEGATIVE NEGATIVE mg/dL   Protein, ur 630 (A) NEGATIVE mg/dL   Nitrite NEGATIVE NEGATIVE   Leukocytes,Ua NEGATIVE NEGATIVE   RBC / HPF 11-20 0 - 5 RBC/hpf   WBC, UA 0-5 0 - 5 WBC/hpf   Bacteria, UA RARE (A) NONE SEEN   Squamous Epithelial / LPF 0-5 0 - 5   Mucus PRESENT   Lactic acid, plasma     Status: None   Collection Time: 02/07/22  6:07 PM  Result Value Ref Range   Lactic Acid, Venous 1.9 0.5 - 1.9 mmol/L  Brain natriuretic peptide     Status: Abnormal   Collection Time: 02/07/22  6:07 PM  Result Value Ref Range   B Natriuretic Peptide 119.9 (H) 0.0 - 100.0 pg/mL  Troponin I (High Sensitivity)     Status: Abnormal   Collection Time: 02/07/22  6:07 PM  Result Value Ref Range   Troponin I (High Sensitivity) 23 (H) <18 ng/L  Procalcitonin - Baseline     Status: None   Collection Time: 02/07/22  6:07 PM  Result Value Ref Range   Procalcitonin 0.58 ng/mL  CBC with Differential/Platelet     Status: Abnormal   Collection Time:  02/07/22  6:07 PM  Result Value Ref Range   WBC 12.0 (H) 4.0 - 10.5 K/uL   RBC 3.88 (L) 4.22 - 5.81 MIL/uL   Hemoglobin 11.7 (L) 13.0 - 17.0 g/dL   HCT 16.0 (L) 10.9 - 32.3 %   MCV 92.5 80.0 - 100.0 fL   MCH 30.2 26.0 - 34.0 pg   MCHC 32.6 30.0 - 36.0 g/dL   RDW 55.7 32.2 - 02.5 %   Platelets 147 (L) 150 - 400 K/uL   nRBC 0.0 0.0 - 0.2 %   Neutrophils Relative % 83 %   Neutro Abs 10.0 (H) 1.7 - 7.7 K/uL  Lymphocytes Relative 7 %   Lymphs Abs 0.8 0.7 - 4.0 K/uL   Monocytes Relative 9 %   Monocytes Absolute 1.1 (H) 0.1 - 1.0 K/uL   Eosinophils Relative 0 %   Eosinophils Absolute 0.0 0.0 - 0.5 K/uL   Basophils Relative 0 %   Basophils Absolute 0.1 0.0 - 0.1 K/uL   Immature Granulocytes 1 %   Abs Immature Granulocytes 0.10 (H) 0.00 - 0.07 K/uL  Blood gas, venous     Status: Abnormal   Collection Time: 02/07/22  6:54 PM  Result Value Ref Range   pH, Ven 7.41 7.25 - 7.43   pCO2, Ven 37 (L) 44 - 60 mmHg   pO2, Ven 36 32 - 45 mmHg   Bicarbonate 23.5 20.0 - 28.0 mmol/L   Acid-base deficit 0.9 0.0 - 2.0 mmol/L   O2 Saturation 64.6 %   Patient temperature 37.0    Collection site VEIN   D-dimer, quantitative     Status: Abnormal   Collection Time: 02/07/22  7:11 PM  Result Value Ref Range   D-Dimer, Quant 1.40 (H) 0.00 - 0.50 ug/mL-FEU  Type and screen     Status: None   Collection Time: 02/07/22  7:47 PM  Result Value Ref Range   ABO/RH(D) O POS    Antibody Screen NEG    Sample Expiration      02/10/2022,2359 Performed at [facility], [address] [place], [place] [address]   T4, free     Status: None   Collection Time: 02/07/22  8:40 PM  Result Value Ref Range   Free T4 0.84 0.61 - 1.12 ng/dL  TSH     Status: Abnormal   Collection Time: 02/07/22  8:40 PM  Result Value Ref Range   TSH 0.091 (L) 0.350 - 4.500 uIU/mL  Troponin I (High Sensitivity)     Status: Abnormal   Collection Time: 02/07/22  8:40 PM  Result Value Ref Range   Troponin I (High  Sensitivity) 39 (H) <18 ng/L  Troponin I (High Sensitivity)     Status: Abnormal   Collection Time: 02/08/22 12:25 AM  Result Value Ref Range   Troponin I (High Sensitivity) 62 (H) <18 ng/L     Current Facility-Administered Medications:    acetaminophen (TYLENOL) tablet 1,000 mg, 1,000 mg, Oral, Q6H PRN, Allena Katz, Radwan Cowley V, MD   ceFEPIme (MAXIPIME) 2 g in sodium chloride 0.9 % 100 mL IVPB, 2 g, Intravenous, Q24H, Nazari, Walid A, RPH   hydrALAZINE (APRESOLINE) injection 10 mg, 10 mg, Intravenous, Q6H PRN, Gertha Calkin, MD   lactated ringers infusion, , Intravenous, Continuous, Irena Cords V, MD, Last Rate: 50 mL/hr at 02/07/22 2058, New Bag at 02/07/22 2058   metroNIDAZOLE (FLAGYL) IVPB 500 mg, 500 mg, Intravenous, Q12H, Brandelyn Henne V, MD   morphine (PF) 2 MG/ML injection 2 mg, 2 mg, Intravenous, Q4H PRN, Gertha Calkin, MD   pantoprazole (PROTONIX) injection 40 mg, 40 mg, Intravenous, Q12H, Irena Cords V, MD, 40 mg at 02/07/22 2047   vancomycin variable dose per unstable renal function (pharmacist dosing), , Does not apply, See admin instructions, Bettey Costa, RPH  Current Outpatient Medications:    albuterol (VENTOLIN HFA) 108 (90 Base) MCG/ACT inhaler, Inhale 1-2 puffs into the lungs every 6 (six) hours as needed for wheezing or shortness of breath. (Patient not taking: Reported on 11/29/2021), Disp: 8 g, Rfl: 0   aspirin EC 81 MG tablet, Take 81 mg by mouth daily., Disp: ,  Rfl:    Cholecalciferol (VITAMIN D) 50 MCG (2000 UT) CAPS, Take by mouth., Disp: , Rfl:    fluticasone (FLONASE) 50 MCG/ACT nasal spray, Place 2 sprays into both nostrils daily., Disp: , Rfl:    losartan (COZAAR) 100 MG tablet, Take 100 mg by mouth daily., Disp: , Rfl:    omeprazole (PRILOSEC) 20 MG capsule, Take 1 capsule (20 mg total) by mouth daily., Disp: 30 capsule, Rfl: 3   simvastatin (ZOCOR) 20 MG tablet, Take 20 mg by mouth daily., Disp: , Rfl:    vitamin B-12 (CYANOCOBALAMIN) 1000 MCG tablet, Take 1,000 mcg by  mouth daily., Disp: , Rfl:   COVID-19 Labs Recent Labs    02/07/22 1911  DDIMER 1.40*   No results found for: "SARSCOV2NAA"  Radiological Exams on Admission: DG Chest Portable 1 View  Result Date: 02/07/2022 CLINICAL DATA:  Sepsis EXAM: PORTABLE CHEST 1 VIEW COMPARISON:  2008 FINDINGS: Patchy opacities are present primarily in the mid left lung and bilateral lung bases. No pleural effusion pneumothorax. Cardiomediastinal contours are within normal limits for technique. IMPRESSION: Bilateral patchy opacities may reflect pneumonia. Electronically Signed   By: Guadlupe Spanish M.D.   On: 02/07/2022 18:26   CT ABDOMEN PELVIS WO CONTRAST  Result Date: 02/07/2022 CLINICAL DATA:  Abdominal pain, acute, nonlocalized. Nausea, vomiting and generalized weakness for 1 day. EXAM: CT ABDOMEN AND PELVIS WITHOUT CONTRAST TECHNIQUE: Multidetector CT imaging of the abdomen and pelvis was performed following the standard protocol without IV contrast. RADIATION DOSE REDUCTION: This exam was performed according to the departmental dose-optimization program which includes automated exposure control, adjustment of the mA and/or kV according to patient size and/or use of iterative reconstruction technique. COMPARISON:  Limited correlation made with upper GI series 05/15/2012. FINDINGS: Lower chest: Images through the lung bases are degraded by breathing artifact. There are emphysematous changes with central airway thickening. Patchy subpleural opacities at both lung bases, likely reflecting atelectasis. No consolidation. Mild aortic and coronary artery atherosclerosis are noted. There is a small hiatal hernia. Hepatobiliary: The liver appears unremarkable as imaged in the noncontrast state. No evidence of gallstones, gallbladder wall thickening or biliary dilatation. Pancreas: Unremarkable. No pancreatic ductal dilatation or surrounding inflammatory changes. Spleen: Normal in size without focal abnormality. Adrenals/Urinary  Tract: Both adrenal glands appear normal. The kidneys appear normal without evidence of urinary tract calculus, suspicious lesion or hydronephrosis. The bladder appears unremarkable for its degree of distention. Stomach/Bowel: No enteric contrast administered. As above, small hiatal hernia. The stomach otherwise appears unremarkable. No bowel wall thickening, distention or surrounding inflammation. The appendix appears normal. There is prominent stool throughout the colon. Mild sigmoid colon diverticular changes are present. Vascular/Lymphatic: There are no enlarged abdominal or pelvic lymph nodes. Aortic and branch vessel atherosclerosis without acute vascular findings on noncontrast imaging. Reproductive: The prostate gland and seminal vesicles appear unremarkable. Other: No evidence of abdominal wall mass or hernia. No ascites. Musculoskeletal: No acute or significant osseous findings. Mild multilevel lumbar spondylosis. IMPRESSION: 1. No acute findings or explanation for the patient's symptoms. 2. Emphysema and probable atelectasis at both lung bases. 3. Prominent stool throughout the colon, suggesting constipation. Mild sigmoid diverticulosis without acute inflammation. 4. Aortic Atherosclerosis (ICD10-I70.0) and Emphysema (ICD10-J43.9). Electronically Signed   By: Carey Bullocks M.D.   On: 02/07/2022 15:47    EKG: Independently reviewed.  EKG shows sinus tachycardia at 104 with a PR interval of 169 PACs, QTc of 453, otherwise normal.  No infarct noted.   Assessment and Plan: *  Chills Blood pressure 137/71, pulse 69, temperature (!) 100.9 F (38.3 C), temperature source Oral, resp. rate 17, height 5\' 11"  (1.803 m), weight 86.2 kg, SpO2 94 %.     Latest Ref Rng & Units 02/07/2022    6:07 PM 02/07/2022    1:45 PM 11/01/2008    8:10 AM  CBC  WBC 4.0 - 10.5 K/uL 12.0  15.0    Hemoglobin 13.0 - 17.0 g/dL 16.111.7  09.613.1  04.515.6   Hematocrit 39.0 - 52.0 % 35.9  40.6    Platelets 150 - 400 K/uL 147  187     Attribute to sepsis and possible infection with unclear source.  D/d also could be from thyroid abnormality.  We will follow and treat.   Generalized weakness Attribute to possible infection. Aspiration/ fall precaution.   Elevated troponin  D/d include demand ischemia/ cardiac strain from PE. D/d also include NSTEMI. We will follow. V/Q in am. Echo in am.  Cardiology consult as needed per AM team.  Antiplatelet therapy if troponin goes over 100 or if pt becomes symptomatic with chest pain or sob.  R/B need to be d/w daughter as pt has hearing loss.   Sepsis (HCC) Pt meets sepsis criteria and cont on iv abx.  We will follow cultures.  Anemia Drop in hemoglobin with repeat cbc    Latest Ref Rng & Units 02/07/2022    6:07 PM 02/07/2022    1:45 PM 11/01/2008    8:10 AM  CBC  WBC 4.0 - 10.5 K/uL 12.0  15.0    Hemoglobin 13.0 - 17.0 g/dL 40.911.7  81.113.1  91.415.6   Hematocrit 39.0 - 52.0 % 35.9  40.6    Platelets 150 - 400 K/uL 147  187     Type and screen.  Iv ppi.  Stool guaiac. retic count/ b12 folate /ferritin/ iron/tibc.   Elevated glucose level Suspect dm II. Will get a1c. Treat once resulted.    Hypertension Vitals:   02/07/22 1343 02/07/22 1700 02/07/22 1931 02/07/22 2200  BP: (!) 165/90 (!) 155/78 (!) 163/76 (!) 195/160   02/07/22 2335  BP: 137/71  PRN Hydralazine.  losartan held due to CKD.  Stage 3b chronic kidney disease (HCC) Lab Results  Component Value Date   CREATININE 2.13 (H) 02/07/2022   CREATININE 1.17 10/27/2008  pt has h/o ckd in chart and trend as above.  Ct negative for any obstruction.    Abnormal thyroid function test  Subclinical hypothyroidism. Will defer to pcp to get additional thyroid cascade and evaluate or endo referral and thyroid usg for goiter eval.   Gastroesophageal reflux disease IV PPI.   DVT prophylaxis:  Heparin    Code Status:  DNR.   Family Communication:  Ala DachMorrell,Mary (Spouse)  [phone] (Home Phone)    Disposition Plan:  SNF  Consults called:  None   Admission status: Inpatient.   Gertha CalkinEkta V Braeton Wolgamott MD [facility]  6 PM- 2 AM. Please contact me via secure Chat 6 PM-2 AM. [phone] ( Pager ) To contact the [facility] Attending or Consulting provider 7A - 7P or covering provider during after hours 7P -7A, for this patient.   Check the care team in [facility] and look for a) attending/consulting [facility] provider listed and b) the [facility] team listed Log into [email or web address] and use [place]'s universal password to access. If you do not have the password, please contact the hospital operator. Locate the [facility] provider you are looking for under [facility] and page to  a number that you can be directly reached. If you still have difficulty reaching the provider, please page the [facility] (Director on Call) for the Hospitalists listed on amion for assistance. [email or web address] 02/08/2022, 2:04 AM

## 2022-02-07 NOTE — ED Triage Notes (Signed)
Patient to ED for vomiting/nausea with generalized weakness for approx 24 hours. Patient's daughter states he normally walks with cane but unable to walk like normal today. Fever of 100.7 at [facility] and negative covid test.

## 2022-02-07 NOTE — Progress Notes (Signed)
PHARMACY -  BRIEF ANTIBIOTIC NOTE   Pharmacy has received consult(s) for vancomycin and cefepime from an ED provider.  The patient's profile has been reviewed for ht/wt/allergies/indication/available labs.    One time order(s) placed for vancomycin 1,000 mg x 1 and cefepime 2 grams x 1  Further antibiotics/pharmacy consults should be ordered by admitting physician if indicated.                       Thank you,  Elliot Gurney, PharmD Clinical Pharmacist  02/07/2022 3:29 PM

## 2022-02-07 NOTE — Consult Note (Addendum)
Pharmacy Antibiotic Note  Frederick Simon is a 88 y.o. male admitted on 02/07/2022 with sepsis.  Pharmacy has been consulted for Vancomycin and Cefepime dosing. Patient received Vancomycin 1g IV and Cefepime 2g IV in ED and had Metronidazole 500mg  Q12 hours ordered.  Plan: Will order additional Vancomycin 750mg  IV x 1 to equal 1750mg  total loading dose. Deferring further doses as patient appears to have AKI. Will order random vancomycin level~24 hours after 750mg  dose given and continue to assess renal function daily.  Cefepime 2g Q24 hours.  Will continue to monitor renal function and adjust dosing as appropriate.  Height: 5\' 11"  (180.3 cm) Weight: 86.2 kg (190 lb) IBW/kg (Calculated) : 75.3  Temp (24hrs), Avg:99.3 F (37.4 C), Min:98.5 F (36.9 C), Max:99.9 F (37.7 C)  Recent Labs  Lab 02/07/22 1345 02/07/22 1522 02/07/22 1807  WBC 15.0*  --  12.0*  CREATININE 2.13*  --   --   LATICACIDVEN  --  1.7 1.9    Estimated Creatinine Clearance: 23.1 mL/min (A) (by C-G formula based on SCr of 2.13 mg/dL (H)).    No Known Allergies  Antimicrobials this admission: Vancomycin 8/31 >>  Cefepime 8/31 >>  Metronidazole 8/31 >>  Dose adjustments this admission: N/A  Microbiology results: 8/31 BCx: pending 8/31 UCx: pending    Thank you for allowing pharmacy to be a part of this patient's care.  9/31 02/07/2022 8:32 PM

## 2022-02-07 NOTE — Progress Notes (Signed)
Elink following code sepsis °

## 2022-02-07 NOTE — Progress Notes (Signed)
Notified provider of need to order lactic acid and repeat lactic acid.  

## 2022-02-07 NOTE — Progress Notes (Signed)
CODE SEPSIS - PHARMACY COMMUNICATION  **Broad Spectrum Antibiotics should be administered within 1 hour of Sepsis diagnosis**  Time Code Sepsis Called/Page Received: 1526  Antibiotics Ordered: vancomycin 1,000 mg x 1 and cefepime 2 grams x 1  Time of 1st antibiotic administration: 1537  Additional action taken by pharmacy: none  If necessary, Name of Provider/Nurse Contacted: n/a   Elliot Gurney, PharmD Clinical Pharmacist  02/07/2022 3:29 PM

## 2022-02-07 NOTE — ED Provider Notes (Addendum)
[facility] Provider Note    Event Date/Time   First MD Initiated Contact with Patient 02/07/22 1507     (approximate)   History   Emesis   HPI  Frederick Simon is a 88 y.o. male who went out with his daughter and was looking for shoes and went to eat [place] and started having shaking chills and then began vomiting went to the nurses office at his residence and had a negative COVID test was sent here.  Reports mild abdominal discomfort no cough no shortness of breath low-grade fever no dysuria      Physical Exam   Triage Vital Signs: ED Triage Vitals [02/07/22 1343]  Enc Vitals Group     BP (!) 165/90     Pulse Rate (!) 119     Resp 18     Temp 99.5 F (37.5 C)     Temp Source Oral     SpO2 92 %     Weight 190 lb (86.2 kg)     Height 5\' 11"  (1.803 m)     Head Circumference      Peak Flow      Pain Score 0     Pain Loc      Pain Edu?      Excl. in GC?     Most recent vital signs: Vitals:   02/07/22 1343 02/07/22 1700  BP: (!) 165/90 (!) 155/78  Pulse: (!) 119 98  Resp: 18 17  Temp: 99.5 F (37.5 C) 98.5 F (36.9 C)  SpO2: 92% 93%     General: Awake, no distress.  CV:  Good peripheral perfusion.  Heart regular rate and rhythm no audible murmurs Resp:  Normal effort.  Lungs are clear Abd:  No distention.  Soft patient complains of minimal diffuse tenderness no focal tenderness no tenderness to percussion Extremities: No edema   ED Results / Procedures / Treatments   Labs (all labs ordered are listed, but only abnormal results are displayed) Labs Reviewed  COMPREHENSIVE METABOLIC PANEL - Abnormal; Notable for the following components:      Result Value   CO2 20 (*)    Glucose, Bld 176 (*)    BUN 31 (*)    Creatinine, Ser 2.13 (*)    Calcium 8.7 (*)    Total Bilirubin 1.3 (*)    GFR, Estimated 28 (*)    All other components within normal limits  CBC - Abnormal; Notable for the following components:   WBC 15.0 (*)     All other components within normal limits  URINALYSIS, ROUTINE W REFLEX MICROSCOPIC - Abnormal; Notable for the following components:   Color, Urine YELLOW (*)    APPearance HAZY (*)    Glucose, UA 50 (*)    Hgb urine dipstick MODERATE (*)    Protein, ur 100 (*)    Bacteria, UA RARE (*)    All other components within normal limits  CULTURE, BLOOD (SINGLE)  LIPASE, BLOOD  LACTIC ACID, PLASMA  LACTIC ACID, PLASMA     EKG  EKG read and interpreted by me shows sinus tach at a rate of 104 left axis poor R wave progression but no acute ST-T changes   RADIOLOGY CT read by radiology reviewed and interpreted by me does not help localize any of the cause of his infection.  I got a chest x-ray after that which looks sort of CHF he to me.  I am waiting for the radiologist report.  I have ordered a BNP to help diagnose that and a troponin that could explain his decreased renal function.  It does not help localizing any kind of infectious site unless perhaps he has an atypical pneumonia.   PROCEDURES:  Critical Care performed:   Procedures   MEDICATIONS ORDERED IN ED: Medications  lactated ringers infusion (has no administration in time range)  lactated ringers bolus 1,000 mL (0 mLs Intravenous Stopped 02/07/22 1612)    And  lactated ringers bolus 1,000 mL (0 mLs Intravenous Stopped 02/07/22 1612)    And  lactated ringers bolus 1,000 mL (has no administration in time range)  sodium chloride 0.9 % bolus 1,000 mL (has no administration in time range)  ceFEPIme (MAXIPIME) 2 g in sodium chloride 0.9 % 100 mL IVPB (0 g Intravenous Stopped 02/07/22 1612)  metroNIDAZOLE (FLAGYL) IVPB 500 mg (0 mg Intravenous Stopped 02/07/22 1655)  vancomycin (VANCOCIN) IVPB 1000 mg/200 mL premix (1,000 mg Intravenous New Bag/Given 02/07/22 1652)  ondansetron (ZOFRAN) injection 4 mg (4 mg Intravenous Given 02/07/22 1719)     IMPRESSION / MDM / ASSESSMENT AND PLAN / ED COURSE  I reviewed the triage vital signs  and the nursing notes. Patient is tachycardic blood pressures not low his white count is 15,000.  These 2 things make him meet sepsis criteria.  We will get this going.  I will give him some fluids he has AKI now which is new from his last blood work in May.  Differential diagnosis includes, but is not limited to, intra-abdominal catastrophe or sepsis of the 2 main contenders for his diagnosis.  CT done without contrast because of the new decreased GFR did not show anything intra-abdominal.  I do not have a source for this gentleman's sepsis his lactic acid is not elevated.  Patient's presentation is most consistent with acute presentation with potential threat to life or bodily function.The patient is on the cardiac monitor to evaluate for evidence of arrhythmia and/or significant heart rate changes.  None have been seen      FINAL CLINICAL IMPRESSION(S) / ED DIAGNOSES   Final diagnoses:  Nausea and vomiting, unspecified vomiting type  Sepsis, due to unspecified organism, unspecified whether acute organ dysfunction present (HCC)  AKI (acute kidney injury) (HCC)     Rx / DC Orders   ED Discharge Orders     None        Note:  This document was prepared using Dragon voice recognition software and may include unintentional dictation errors.   Arnaldo Natal, MD 02/07/22 1755    Arnaldo Natal, MD 02/07/22 [phone]

## 2022-02-07 NOTE — Progress Notes (Signed)
Notified bedside nurse of need to order and draw lactic acid.

## 2022-02-08 ENCOUNTER — Inpatient Hospital Stay: Payer: Medicare Other

## 2022-02-08 ENCOUNTER — Inpatient Hospital Stay (HOSPITAL_COMMUNITY)
Admit: 2022-02-08 | Discharge: 2022-02-08 | Disposition: A | Discharge status: Home / Self Care | Payer: Medicare Other | Attending: Internal Medicine | Admitting: Internal Medicine

## 2022-02-08 DIAGNOSIS — R6883 Chills (without fever): Secondary | ICD-10-CM | POA: Diagnosis not present

## 2022-02-08 DIAGNOSIS — I5021 Acute systolic (congestive) heart failure: Secondary | ICD-10-CM

## 2022-02-08 DIAGNOSIS — R778 Other specified abnormalities of plasma proteins: Secondary | ICD-10-CM

## 2022-02-08 DIAGNOSIS — R946 Abnormal results of thyroid function studies: Secondary | ICD-10-CM | POA: Diagnosis present

## 2022-02-08 LAB — IRON AND TIBC
Iron: 27 ug/dL — ABNORMAL LOW (ref 45–182)
Saturation Ratios: 11 % — ABNORMAL LOW (ref 17.9–39.5)
TIBC: 253 ug/dL (ref 250–450)
UIBC: 226 ug/dL

## 2022-02-08 LAB — ECHOCARDIOGRAM COMPLETE
Area-P 1/2: 2.85 cm2
Height: 71 in
Weight: 3040 oz

## 2022-02-08 LAB — RESPIRATORY PANEL BY PCR

## 2022-02-08 LAB — MRSA NEXT GEN BY PCR, NASAL: MRSA by PCR Next Gen: NOT DETECTED

## 2022-02-08 LAB — TROPONIN I (HIGH SENSITIVITY)
Troponin I (High Sensitivity): 62 ng/L — ABNORMAL HIGH (ref <18)
Troponin I (High Sensitivity): 71 ng/L — ABNORMAL HIGH (ref <18)
Troponin I (High Sensitivity): 74 ng/L — ABNORMAL HIGH (ref <18)
Troponin I (High Sensitivity): 100 ng/L (ref <18)

## 2022-02-08 LAB — VANCOMYCIN, RANDOM: Vancomycin Rm: 7 ug/mL

## 2022-02-08 LAB — VITAMIN B12: Vitamin B-12: 1155 pg/mL — ABNORMAL HIGH (ref 180–914)

## 2022-02-08 LAB — HEMOGLOBIN A1C
Hgb A1c MFr Bld: 6.1 % — ABNORMAL HIGH (ref 4.8–5.6)
Mean Plasma Glucose: 128.37 mg/dL

## 2022-02-08 LAB — SARS CORONAVIRUS 2 BY RT PCR: SARS Coronavirus 2 by RT PCR: NEGATIVE

## 2022-02-08 LAB — CREATININE, SERUM
Creatinine, Ser: 1.86 mg/dL — ABNORMAL HIGH (ref 0.61–1.24)
GFR, Estimated: 33 mL/min — ABNORMAL LOW (ref >60)

## 2022-02-08 LAB — RETICULOCYTES
Immature Retic Fract: 14.1 % (ref 2.3–15.9)
RBC.: 3.93 MIL/uL — ABNORMAL LOW (ref 4.22–5.81)
Retic Count, Absolute: 49.9 10*3/uL (ref 19.0–186.0)
Retic Ct Pct: 1.3 % (ref 0.4–3.1)

## 2022-02-08 LAB — FOLATE: Folate: 8.9 ng/mL (ref >5.9)

## 2022-02-08 LAB — FERRITIN: Ferritin: 108 ng/mL (ref 24–336)

## 2022-02-08 MED ORDER — METOPROLOL TARTRATE 5 MG/5ML IV SOLN: 5.0000 mg | INTRAVENOUS | Status: DC | PRN

## 2022-02-08 MED ORDER — SODIUM CHLORIDE 0.9 % IV SOLN
200.0000 mg | Freq: Every day | INTRAVENOUS | Status: AC
  Administered 2022-02-08 – 2022-02-09 (×2): 200 mg via INTRAVENOUS
  Filled 2022-02-08 (×2): qty 200

## 2022-02-08 MED ORDER — DOCUSATE SODIUM 100 MG PO CAPS
100.0000 mg | ORAL_CAPSULE | Freq: Two times a day (BID) | ORAL | Status: DC
  Administered 2022-02-08 – 2022-02-15 (×14): 100 mg via ORAL
  Filled 2022-02-08 (×15): qty 1

## 2022-02-08 MED ORDER — DM-GUAIFENESIN ER 30-600 MG PO TB12
1.0000 | ORAL_TABLET | Freq: Two times a day (BID) | ORAL | Status: DC | PRN
Start: 2022-02-08 — End: 2022-02-15
  Administered 2022-02-09 – 2022-02-12 (×2): 1 via ORAL
  Filled 2022-02-08 (×3): qty 1

## 2022-02-08 MED ORDER — IPRATROPIUM-ALBUTEROL 0.5-2.5 (3) MG/3ML IN SOLN
3.0000 mL | Freq: Three times a day (TID) | RESPIRATORY_TRACT | Status: DC
  Administered 2022-02-08 (×2): 3 mL via RESPIRATORY_TRACT
  Filled 2022-02-08 (×2): qty 3

## 2022-02-08 MED ORDER — PERFLUTREN LIPID MICROSPHERE
1.0000 mL | INTRAVENOUS | Status: AC | PRN
  Administered 2022-02-08: 4 mL via INTRAVENOUS

## 2022-02-08 MED ORDER — TRAZODONE HCL 50 MG PO TABS
50.0000 mg | ORAL_TABLET | Freq: Every evening | ORAL | Status: DC | PRN
  Filled 2022-02-08 (×2): qty 1

## 2022-02-08 MED ORDER — IPRATROPIUM-ALBUTEROL 0.5-2.5 (3) MG/3ML IN SOLN
3.0000 mL | RESPIRATORY_TRACT | Status: DC | PRN
  Administered 2022-02-09: 3 mL via RESPIRATORY_TRACT
  Filled 2022-02-08 (×2): qty 3

## 2022-02-08 MED ORDER — VANCOMYCIN HCL 1750 MG/350ML IV SOLN
1750.0000 mg | Freq: Once | INTRAVENOUS | Status: AC
  Administered 2022-02-08: 1750 mg via INTRAVENOUS
  Filled 2022-02-08: qty 350

## 2022-02-08 MED ORDER — HYDRALAZINE HCL 20 MG/ML IJ SOLN
10.0000 mg | INTRAMUSCULAR | Status: DC | PRN
  Administered 2022-02-08 – 2022-02-11 (×3): 10 mg via INTRAVENOUS
  Filled 2022-02-08 (×3): qty 1

## 2022-02-08 MED ORDER — ONDANSETRON HCL 4 MG/2ML IJ SOLN
4.0000 mg | Freq: Four times a day (QID) | INTRAMUSCULAR | Status: DC | PRN
  Administered 2022-02-08 – 2022-02-11 (×6): 4 mg via INTRAVENOUS
  Filled 2022-02-08 (×6): qty 2

## 2022-02-08 MED ORDER — SIMVASTATIN 20 MG PO TABS
20.0000 mg | ORAL_TABLET | Freq: Every day | ORAL | Status: DC
  Administered 2022-02-08 – 2022-02-15 (×8): 20 mg via ORAL
  Filled 2022-02-08 (×8): qty 1

## 2022-02-08 MED ORDER — POLYETHYLENE GLYCOL 3350 17 G PO PACK
17.0000 g | PACK | Freq: Every day | ORAL | Status: DC
  Administered 2022-02-08: 17 g via ORAL
  Filled 2022-02-08: qty 1

## 2022-02-08 MED ORDER — TECHNETIUM TO 99M ALBUMIN AGGREGATED
4.0000 | Freq: Once | INTRAVENOUS | Status: AC | PRN
  Administered 2022-02-08: 3.78 via INTRAVENOUS

## 2022-02-08 MED ORDER — ACETAMINOPHEN 500 MG PO TABS
1000.0000 mg | ORAL_TABLET | Freq: Four times a day (QID) | ORAL | Status: DC | PRN
  Administered 2022-02-10 – 2022-02-13 (×4): 1000 mg via ORAL
  Filled 2022-02-08 (×7): qty 2

## 2022-02-08 MED ORDER — SENNOSIDES-DOCUSATE SODIUM 8.6-50 MG PO TABS: 1.0000 | ORAL_TABLET | Freq: Every evening | ORAL | Status: DC | PRN

## 2022-02-08 MED ORDER — IBUPROFEN 400 MG PO TABS
400.0000 mg | ORAL_TABLET | Freq: Four times a day (QID) | ORAL | Status: DC | PRN
Start: 2022-02-08 — End: 2022-02-12
  Administered 2022-02-08 – 2022-02-10 (×4): 400 mg via ORAL
  Filled 2022-02-08 (×4): qty 1

## 2022-02-08 NOTE — ED Notes (Signed)
Mia from [facility] called for an update on patient condition

## 2022-02-08 NOTE — Progress Notes (Signed)
PROGRESS NOTE    Frederick Simon  JKD:[id number] DOB: 1926/12/26 DOA: 02/07/2022 PCP: Earnestine Mealing, MD   Brief Narrative:  88 year old with history of HTN, HLD, GERD comes to the hospital from nursing facility with complaints of fevers, chills and weakness.  Also has some nausea.   Assessment & Plan:  Principal Problem:   Chills Active Problems:   Generalized weakness   Elevated troponin   Anemia   Elevated glucose level   Hypertension   Stage 3b chronic kidney disease (HCC)   Abnormal thyroid function test   Gastroesophageal reflux disease     Assessment and Plan: SIRS -Currently no obvious signs of infection.  UA is negative.  Procalcitonin 0.58, chest x-ray showing questionable pneumonia.  Will check COVID-19 and respiratory panel.  CT abdomen pelvis does not show acute pathology besides constipation and atelectasis - Empiric IV vancomycin, cefepime and Flagyl. - Supportive care.  Bronchodilators, I-S/flutter valve.  Generalized weakness To be from underlying infection.  PT/OT.  Elevated troponin -Suspect demand ischemia.  Troponin slowly trending up, echocardiogram ordered. BNP 119. Will consult Cardiology for their input. Check A1c, lipid panel -VQ scan ordered by medical provider as well.  AKI -Baseline 2012 1.17; today 2.13. IVF.   Anemia Stable Hb for now, monitor. Low Iron sat, will give IV iron.   Prediabetes.  A1c 6.1. Prediabetic.    Hypertension IV prn ordered, holding home losartan  Abnormal thyroid function test Subclinical hypothyroidism. Low TSH, normal t4. Repeat in 4-6 weeks outpatient.   Gastroesophageal reflux disease IV PPI.   DVT prophylaxis: SCDs Start: 02/07/22 1931 Code Status: DNR Family Communication:  Daughter at bedside  Still feeling some nausea and has abnormal BS, not stable for dc.    Subjective: Very hard of hearing. NO new complaints. But still congested and at times feeling nausea.  Daughter is at the bedside  as well.     Examination:  General exam: Appears calm and comfortable ; very hard of hearing.  Respiratory system: b/l rhonchi especially at the bases.  Cardiovascular system: S1 & S2 heard, RRR. No JVD, murmurs, rubs, gallops or clicks. No pedal edema. Gastrointestinal system: Abdomen is nondistended, soft and nontender. No organomegaly or masses felt. Normal bowel sounds heard. Central nervous system: Alert and oriented. No focal neurological deficits. Extremities: Symmetric 5 x 5 power. Skin: No rashes, lesions or ulcers Psychiatry: Judgement and insight appear normal. Mood & affect appropriate.     Objective: Vitals:   02/08/22 0200 02/08/22 0300 02/08/22 0400 02/08/22 0500  BP:    (!) 157/84  Pulse:    85  Resp: 16 19 (!) 21 19  Temp:      TempSrc:      SpO2:    92%  Weight:      Height:        Intake/Output Summary (Last 24 hours) at 02/08/2022 0806 Last data filed at 02/08/2022 0700 Gross per 24 hour  Intake 4371.58 ml  Output 1300 ml  Net 3071.58 ml   Filed Weights   02/07/22 1343  Weight: 86.2 kg     Data Reviewed:   CBC: Recent Labs  Lab 02/07/22 1345 02/07/22 1807  WBC 15.0* 12.0*  NEUTROABS  --  10.0*  HGB 13.1 11.7*  HCT 40.6 35.9*  MCV 91.0 92.5  PLT 187 147*   Basic Metabolic Panel: Recent Labs  Lab 02/07/22 1345  NA 138  K 3.9  CL 107  CO2 20*  GLUCOSE 176*  BUN 31*  CREATININE 2.13*  CALCIUM 8.7*   GFR: Estimated Creatinine Clearance: 23.1 mL/min (A) (by C-G formula based on SCr of 2.13 mg/dL (H)). Liver Function Tests: Recent Labs  Lab 02/07/22 1345  AST 28  ALT 20  ALKPHOS 56  BILITOT 1.3*  PROT 7.8  ALBUMIN 4.2   Recent Labs  Lab 02/07/22 1345  LIPASE 31   No results for input(s): "AMMONIA" in the last 168 hours. Coagulation Profile: No results for input(s): "INR", "PROTIME" in the last 168 hours. Cardiac Enzymes: No results for input(s): "CKTOTAL", "CKMB", "CKMBINDEX", "TROPONINI" in the last 168 hours. BNP  (last 3 results) No results for input(s): "PROBNP" in the last 8760 hours. HbA1C: Recent Labs    02/07/22 2040  HGBA1C 6.1*   CBG: No results for input(s): "GLUCAP" in the last 168 hours. Lipid Profile: No results for input(s): "CHOL", "HDL", "LDLCALC", "TRIG", "CHOLHDL", "LDLDIRECT" in the last 72 hours. Thyroid Function Tests: Recent Labs    02/07/22 2040  TSH 0.091*  FREET4 0.84   Anemia Panel: Recent Labs    02/08/22 0420  FOLATE 8.9  FERRITIN 108  TIBC 253  IRON 27*  RETICCTPCT 1.3   Sepsis Labs: Recent Labs  Lab 02/07/22 1522 02/07/22 1807  PROCALCITON  --  0.58  LATICACIDVEN 1.7 1.9    Recent Results (from the past 240 hour(s))  Culture, blood (single)     Status: None (Preliminary result)   Collection Time: 02/07/22  3:15 PM   Specimen: BLOOD  Result Value Ref Range Status   Specimen Description BLOOD RIGHT ANTECUBITAL  Final   Special Requests   Final    BOTTLES DRAWN AEROBIC AND ANAEROBIC Blood Culture adequate volume   Culture   Final    NO GROWTH < 24 HOURS Performed at [facility], [address] [place], [place] [address]    Report Status PENDING  Incomplete         Radiology Studies: DG Chest Portable 1 View  Result Date: 02/07/2022 CLINICAL DATA:  Sepsis EXAM: PORTABLE CHEST 1 VIEW COMPARISON:  2008 FINDINGS: Patchy opacities are present primarily in the mid left lung and bilateral lung bases. No pleural effusion pneumothorax. Cardiomediastinal contours are within normal limits for technique. IMPRESSION: Bilateral patchy opacities may reflect pneumonia. Electronically Signed   By: Guadlupe Spanish M.D.   On: 02/07/2022 18:26   CT ABDOMEN PELVIS WO CONTRAST  Result Date: 02/07/2022 CLINICAL DATA:  Abdominal pain, acute, nonlocalized. Nausea, vomiting and generalized weakness for 1 day. EXAM: CT ABDOMEN AND PELVIS WITHOUT CONTRAST TECHNIQUE: Multidetector CT imaging of the abdomen and pelvis was performed following the standard  protocol without IV contrast. RADIATION DOSE REDUCTION: This exam was performed according to the departmental dose-optimization program which includes automated exposure control, adjustment of the mA and/or kV according to patient size and/or use of iterative reconstruction technique. COMPARISON:  Limited correlation made with upper GI series 05/15/2012. FINDINGS: Lower chest: Images through the lung bases are degraded by breathing artifact. There are emphysematous changes with central airway thickening. Patchy subpleural opacities at both lung bases, likely reflecting atelectasis. No consolidation. Mild aortic and coronary artery atherosclerosis are noted. There is a small hiatal hernia. Hepatobiliary: The liver appears unremarkable as imaged in the noncontrast state. No evidence of gallstones, gallbladder wall thickening or biliary dilatation. Pancreas: Unremarkable. No pancreatic ductal dilatation or surrounding inflammatory changes. Spleen: Normal in size without focal abnormality. Adrenals/Urinary Tract: Both adrenal glands appear normal. The kidneys appear normal without evidence of urinary tract  calculus, suspicious lesion or hydronephrosis. The bladder appears unremarkable for its degree of distention. Stomach/Bowel: No enteric contrast administered. As above, small hiatal hernia. The stomach otherwise appears unremarkable. No bowel wall thickening, distention or surrounding inflammation. The appendix appears normal. There is prominent stool throughout the colon. Mild sigmoid colon diverticular changes are present. Vascular/Lymphatic: There are no enlarged abdominal or pelvic lymph nodes. Aortic and branch vessel atherosclerosis without acute vascular findings on noncontrast imaging. Reproductive: The prostate gland and seminal vesicles appear unremarkable. Other: No evidence of abdominal wall mass or hernia. No ascites. Musculoskeletal: No acute or significant osseous findings. Mild multilevel lumbar  spondylosis. IMPRESSION: 1. No acute findings or explanation for the patient's symptoms. 2. Emphysema and probable atelectasis at both lung bases. 3. Prominent stool throughout the colon, suggesting constipation. Mild sigmoid diverticulosis without acute inflammation. 4. Aortic Atherosclerosis (ICD10-I70.0) and Emphysema (ICD10-J43.9). Electronically Signed   By: Carey Bullocks M.D.   On: 02/07/2022 15:47        Scheduled Meds:  pantoprazole (PROTONIX) IV  40 mg Intravenous Q12H   vancomycin variable dose per unstable renal function (pharmacist dosing)   Does not apply See admin instructions   Continuous Infusions:  ceFEPime (MAXIPIME) IV     lactated ringers 50 mL/hr at 02/07/22 2058   metronidazole 500 mg (02/08/22 0700)     LOS: 1 day   Time spent= 35 mins    Uchenna Seufert Joline Maxcy, MD [facility]  If 7PM-7AM, please contact night-coverage  02/08/2022, 8:06 AM

## 2022-02-08 NOTE — Assessment & Plan Note (Signed)
Blood pressure 137/71, pulse 69, temperature (!) 100.9 F (38.3 C), temperature source Oral, resp. rate 17, height 5\' 11"  (1.803 m), weight 86.2 kg, SpO2 94 %.     Latest Ref Rng & Units 02/07/2022    6:07 PM 02/07/2022    1:45 PM 11/01/2008    8:10 AM  CBC  WBC 4.0 - 10.5 K/uL 12.0  15.0    Hemoglobin 13.0 - 17.0 g/dL 11/03/2008  19.4  17.4   Hematocrit 39.0 - 52.0 % 35.9  40.6    Platelets 150 - 400 K/uL 147  187    Attribute to sepsis and possible infection with unclear source.  D/d also could be from thyroid abnormality.  We will follow and treat.

## 2022-02-08 NOTE — Assessment & Plan Note (Signed)
uncontrolled --hold home losartan 2/2 AKI --cont Lopressor to 100 mg BID (new) --start hydralazine 50 mg TID

## 2022-02-08 NOTE — Assessment & Plan Note (Signed)
Drop in hemoglobin with repeat cbc    Latest Ref Rng & Units 02/07/2022    6:07 PM 02/07/2022    1:45 PM 11/01/2008    8:10 AM  CBC  WBC 4.0 - 10.5 K/uL 12.0  15.0    Hemoglobin 13.0 - 17.0 g/dL 09.2  33.0  07.6   Hematocrit 39.0 - 52.0 % 35.9  40.6    Platelets 150 - 400 K/uL 147  187     Type and screen.  Iv ppi.  Stool guaiac. retic count/ b12 folate /ferritin/ iron/tibc.

## 2022-02-08 NOTE — Plan of Care (Signed)
  Problem: Clinical Measurements: Goal: Ability to maintain clinical measurements within normal limits will improve Outcome: Progressing   

## 2022-02-08 NOTE — Assessment & Plan Note (Signed)
  Subclinical hypothyroidism. Will defer to pcp to get additional thyroid cascade and evaluate or endo referral and thyroid usg for goiter eval.

## 2022-02-08 NOTE — ED Notes (Addendum)
Patient attempted to get out of bed. RN walked into room and observed patient at edge of bed naked, cardiac leads and blood pressure cuff removed, and IV tubing being stretched from the head of the bed. Patient urinated on the floor and bed. RN enlisted the help of fellow teammate to get patient cleaned up and back into the  bed. Bed alarm in place and call alarm given to the patient.

## 2022-02-08 NOTE — Plan of Care (Signed)
Trop has been progressively climbing and now critical @ 100.  Hospitalist informed

## 2022-02-08 NOTE — Assessment & Plan Note (Signed)
Suspect dm II. Will get a1c. Treat once resulted.

## 2022-02-08 NOTE — Assessment & Plan Note (Addendum)
With AKI.

## 2022-02-08 NOTE — Assessment & Plan Note (Signed)
IV PPI. 

## 2022-02-08 NOTE — Assessment & Plan Note (Addendum)
  D/d include demand ischemia/ cardiac strain from PE. D/d also include NSTEMI. We will follow. V/Q in am. Echo in am.  Cardiology consult as needed per AM team.  Antiplatelet therapy if troponin goes over 100 or if pt becomes symptomatic with chest pain or sob.  R/B need to be d/w daughter as pt has hearing loss.

## 2022-02-08 NOTE — Assessment & Plan Note (Signed)
Pt meets sepsis criteria and cont on iv abx.  We will follow cultures.

## 2022-02-08 NOTE — Assessment & Plan Note (Signed)
Likely due to PNA --mobility protocol

## 2022-02-08 NOTE — Consult Note (Signed)
Cardiology Consultation   Patient ID: KNIGHT OELKERS MRN: [id number]; DOB: 14-Oct-1926  Admit date: 02/07/2022 Date of Consult: 02/08/2022  PCP:  Earnestine Mealing, MD   [facility] Providers Cardiologist:  None   {  Patient Profile:   DEAVON PODGORSKI is a 88 y.o. male with a hx of GERD, mild cognitive impairement, CKD Stage 3, HTN who is being seen 02/08/2022 for the evaluation of elevated troponin at the request of Dr. Nelson Chimes.  History of Present Illness:   Mr. Grunewald with no prior cardiac history. The patient lives in an assited living. He uses a can to get around. He denies exertional cardiac symptoms. He quit smoking 40 years ago. No alcohol or drug history. Both mom and dad had MI.   The patient was brought into the ER 02/07/22 for generalized weakness. He was brought by his daughter. They were out to eat when the patient started having chills and vomited. He had a low grade fever. Also some SOB. NO chest pain or LLE.   In the ER BP 165/90, RR 119, RR 18, 99.3F, 92% O2. Labs showed CO2 20, Scr 2.13, BUN 31, WBC 15. EKG with ST 104bpm, PAC, IVCD, no significant ST/T wave changes. CXR with PNA. HS trop 39>62>71>74>100. COVDI negative. Given IVF and started on IV abx and admitted.    Past Medical History:  Diagnosis Date   Allergic rhinitis    Gastroesophageal reflux disease    Hyperlipidemia    Hypertension     Past Surgical History:  Procedure Laterality Date   BACK SURGERY  May 1975    Dr. Hilton Cork SURGERY  Feb 2004   Dr. Newell Coral    HAND SURGERY  [phone]   Dr. Priscille Kluver   NOSE SURGERY  1989   Dr. Ned Grace SURGERY  [phone]   Dr. Priscille Kluver     Home Medications:  Prior to Admission medications   Medication Sig Start Date End Date Taking? Authorizing Provider  acetaminophen (TYLENOL) 500 MG tablet Take 1,000 mg by mouth every 8 (eight) hours as needed for mild pain or moderate pain.   Yes [provider]  albuterol (VENTOLIN HFA)  108 (90 Base) MCG/ACT inhaler Inhale 1-2 puffs into the lungs every 6 (six) hours as needed for wheezing or shortness of breath. Patient taking differently: Inhale 1-2 puffs into the lungs every 4 (four) hours as needed for shortness of breath. 08/24/21  Yes Baity, Salvadore Oxford, NP  alum & mag hydroxide-simeth (MAALOX/MYLANTA) 200-200-20 MG/5ML suspension Take 30 mLs by mouth every 4 (four) hours as needed for indigestion, heartburn or flatulence.   Yes [provider]  aspirin EC 81 MG tablet Take 81 mg by mouth daily.   Yes [provider]  carbamide peroxide (DEBROX) 6.5 % OTIC solution Place 5 drops into both ears as needed (for ear wax).   Yes [provider]  Cholecalciferol (VITAMIN D) 50 MCG (2000 UT) CAPS Take by mouth.   Yes [provider]  diphenhydrAMINE (BENADRYL) 25 mg capsule Take 25 mg by mouth once as needed (for allergic reaction).   Yes [provider]  ibuprofen (ADVIL) 200 MG tablet Take 400 mg by mouth every 12 (twelve) hours as needed.   Yes [provider]  losartan (COZAAR) 100 MG tablet Take 100 mg by mouth daily. 05/23/20  Yes [provider]  omeprazole (PRILOSEC) 20 MG capsule Take 1 capsule (20 mg total) by mouth daily. 11/29/21  Yes Lorre Munroe, NP  simvastatin (ZOCOR) 20 MG tablet Take 20 mg by mouth daily.   Yes [provider]  vitamin B-12 (CYANOCOBALAMIN) 1000 MCG tablet Take 1,000 mcg by mouth daily.   Yes [provider]  fluticasone (FLONASE) 50 MCG/ACT nasal spray Place 2 sprays into both nostrils daily. Patient not taking: Reported on 02/08/2022    [provider]    Inpatient Medications: Scheduled Meds:  docusate sodium  100 mg Oral BID   ipratropium-albuterol  3 mL Nebulization TID   pantoprazole (PROTONIX) IV  40 mg Intravenous Q12H   polyethylene glycol  17 g Oral Daily   vancomycin variable dose per unstable renal function (pharmacist dosing)   Does not apply See  admin instructions   Continuous Infusions:  ceFEPime (MAXIPIME) IV     iron sucrose     lactated ringers 50 mL/hr at 02/07/22 2058   metronidazole 500 mg (02/08/22 0700)   PRN Meds: acetaminophen, dextromethorphan-guaiFENesin, hydrALAZINE, ibuprofen, ipratropium-albuterol, metoprolol tartrate, morphine injection, ondansetron (ZOFRAN) IV, senna-docusate, traZODone  Allergies:   No Known Allergies  Social History:   Social History   Socioeconomic History   Marital status: Widowed    Spouse name: Not on file   Number of children: 4   Years of education: Not on file   Highest education level: Not on file  Occupational History   Occupation: Geologist, engineering: [employer]  Tobacco Use   Smoking status: Former   Smokeless tobacco: Never   Tobacco comments:    quit 1980's  Substance and Sexual Activity   Alcohol use: No   Drug use: No   Sexual activity: Not on file  Other Topics Concern   Not on file  Social History Narrative   4 daughters      Has advanced directives   Daughter [name] is health care POA   Requests DNR--done 08/03/20   No feeding tube if cognitively unaware   Social Determinants of Health   Financial Resource Strain: Not on file  Food Insecurity: Not on file  Transportation Needs: Not on file  Physical Activity: Not on file  Stress: Not on file  Social Connections: Not on file  Intimate Partner Violence: Not on file    Family History:    Family History  Problem Relation Age of Onset   Cancer Mother    Hypertension Mother    Hypertension Father    Stroke Father    Lupus Sister      ROS:  Please see the history of present illness.   All other ROS reviewed and negative.     Physical Exam/Data:   Vitals:   02/08/22 0300 02/08/22 0400 02/08/22 0500 02/08/22 1100  BP:   (!) 157/84 (!) 150/80  Pulse:   85 88  Resp: 19 (!) 21 19 20   Temp:    99.5 F (37.5 C)  TempSrc:    Oral  SpO2:   92% 94%  Weight:      Height:         Intake/Output Summary (Last 24 hours) at 02/08/2022 1158 Last data filed at 02/08/2022 0700 Gross per 24 hour  Intake 4371.58 ml  Output 1300 ml  Net 3071.58 ml      02/07/2022    1:43 PM 11/29/2021   10:51 AM 09/06/2021   11:16 AM  Last 3 Weights  Weight (lbs) 190 lb 191 lb 3.2 oz 192 lb  Weight (kg) 86.183 kg 86.728 kg 87.091 kg  Body mass index is 26.5 kg/m.  General:  Well nourished, well developed, in no acute distress HEENT: normal Neck: no JVD Vascular: No carotid bruits; Distal pulses 2+ bilaterally Cardiac:  normal S1, S2; RRR; no murmur  Lungs:  rhonchi  Abd: soft, nontender, no hepatomegaly  Ext: no edema Musculoskeletal:  No deformities, BUE and BLE strength normal and equal Skin: warm and dry  Neuro:  CNs 2-12 intact, no focal abnormalities noted Psych:  Normal affect   EKG:  The EKG was personally reviewed and demonstrates:  ST 104bpm, PAC, no ST/T wave changes Telemetry:  Telemetry was personally reviewed and demonstrates:  N/A  Relevant CV Studies:  Echo pending  Laboratory Data:  High Sensitivity Troponin:   Recent Labs  Lab 02/07/22 1807 02/07/22 2040 02/08/22 0025 02/08/22 0420 02/08/22 1017  TROPONINIHS 23* 39* 62* 71* 100*  74*     Chemistry Recent Labs  Lab 02/07/22 1345 02/08/22 1017  NA 138  --   K 3.9  --   CL 107  --   CO2 20*  --   GLUCOSE 176*  --   BUN 31*  --   CREATININE 2.13* 1.86*  CALCIUM 8.7*  --   GFRNONAA 28* 33*  ANIONGAP 11  --     Recent Labs  Lab 02/07/22 1345  PROT 7.8  ALBUMIN 4.2  AST 28  ALT 20  ALKPHOS 56  BILITOT 1.3*   Lipids No results for input(s): "CHOL", "TRIG", "HDL", "LABVLDL", "LDLCALC", "CHOLHDL" in the last 168 hours.  Hematology Recent Labs  Lab 02/07/22 1345 02/07/22 1807 02/08/22 0420  WBC 15.0* 12.0*  --   RBC 4.46 3.88* 3.93*  HGB 13.1 11.7*  --   HCT 40.6 35.9*  --   MCV 91.0 92.5  --   MCH 29.4 30.2  --   MCHC 32.3 32.6  --   RDW 13.3 13.3  --   PLT 187 147*   --    Thyroid  Recent Labs  Lab 02/07/22 2040  TSH 0.091*  FREET4 0.84    BNP Recent Labs  Lab 02/07/22 1807  BNP 119.9*    DDimer  Recent Labs  Lab 02/07/22 1911  DDIMER 1.40*     Radiology/Studies:  DG Chest Portable 1 View  Result Date: 02/07/2022 CLINICAL DATA:  Sepsis EXAM: PORTABLE CHEST 1 VIEW COMPARISON:  2008 FINDINGS: Patchy opacities are present primarily in the mid left lung and bilateral lung bases. No pleural effusion pneumothorax. Cardiomediastinal contours are within normal limits for technique. IMPRESSION: Bilateral patchy opacities may reflect pneumonia. Electronically Signed   By: Guadlupe Spanish M.D.   On: 02/07/2022 18:26   CT ABDOMEN PELVIS WO CONTRAST  Result Date: 02/07/2022 CLINICAL DATA:  Abdominal pain, acute, nonlocalized. Nausea, vomiting and generalized weakness for 1 day. EXAM: CT ABDOMEN AND PELVIS WITHOUT CONTRAST TECHNIQUE: Multidetector CT imaging of the abdomen and pelvis was performed following the standard protocol without IV contrast. RADIATION DOSE REDUCTION: This exam was performed according to the departmental dose-optimization program which includes automated exposure control, adjustment of the mA and/or kV according to patient size and/or use of iterative reconstruction technique. COMPARISON:  Limited correlation made with upper GI series 05/15/2012. FINDINGS: Lower chest: Images through the lung bases are degraded by breathing artifact. There are emphysematous changes with central airway thickening. Patchy subpleural opacities at both lung bases, likely reflecting atelectasis. No consolidation. Mild aortic and coronary artery atherosclerosis are noted. There is a small hiatal hernia. Hepatobiliary: The liver  appears unremarkable as imaged in the noncontrast state. No evidence of gallstones, gallbladder wall thickening or biliary dilatation. Pancreas: Unremarkable. No pancreatic ductal dilatation or surrounding inflammatory changes. Spleen:  Normal in size without focal abnormality. Adrenals/Urinary Tract: Both adrenal glands appear normal. The kidneys appear normal without evidence of urinary tract calculus, suspicious lesion or hydronephrosis. The bladder appears unremarkable for its degree of distention. Stomach/Bowel: No enteric contrast administered. As above, small hiatal hernia. The stomach otherwise appears unremarkable. No bowel wall thickening, distention or surrounding inflammation. The appendix appears normal. There is prominent stool throughout the colon. Mild sigmoid colon diverticular changes are present. Vascular/Lymphatic: There are no enlarged abdominal or pelvic lymph nodes. Aortic and branch vessel atherosclerosis without acute vascular findings on noncontrast imaging. Reproductive: The prostate gland and seminal vesicles appear unremarkable. Other: No evidence of abdominal wall mass or hernia. No ascites. Musculoskeletal: No acute or significant osseous findings. Mild multilevel lumbar spondylosis. IMPRESSION: 1. No acute findings or explanation for the patient's symptoms. 2. Emphysema and probable atelectasis at both lung bases. 3. Prominent stool throughout the colon, suggesting constipation. Mild sigmoid diverticulosis without acute inflammation. 4. Aortic Atherosclerosis (ICD10-I70.0) and Emphysema (ICD10-J43.9). Electronically Signed   By: Carey Bullocks M.D.   On: 02/07/2022 15:47     Assessment and Plan:   Elevated troponin - HS trop elevated up to 100 in the setting os SEPSIS/PNA, AKI, anemia - No chest pain reported - suspect demand ischemia - echo ordered - no prior cardiac history - check lipids and A1C - patient is fairly functional at baseline, however given age and lack of symptoms, family will not likely want invasive work-up  Generalized weakness/chills Sepsis - s/p IVF - IV abx per IM  Anemia - Hgb 13/1>11.7 - continue to trend - Fecal occult ordered - no overt bleeding reported  HTN - PTA  Losartan held for AKI  AKI on CKD stage 3 - Improving with fluids   For questions or updates, please contact [facility] Please consult [email or web address] for contact info under    Signed, Eleyna Brugh David Stall, PA-C  02/08/2022 11:58 AM

## 2022-02-09 DIAGNOSIS — J189 Pneumonia, unspecified organism: Secondary | ICD-10-CM | POA: Diagnosis not present

## 2022-02-09 DIAGNOSIS — N179 Acute kidney failure, unspecified: Secondary | ICD-10-CM

## 2022-02-09 HISTORY — DX: Pneumonia, unspecified organism: J18.9

## 2022-02-09 HISTORY — DX: Acute kidney failure, unspecified: N17.9

## 2022-02-09 LAB — URINE CULTURE

## 2022-02-09 LAB — COMPREHENSIVE METABOLIC PANEL
ALT: 22 U/L (ref 0–44)
AST: 51 U/L — ABNORMAL HIGH (ref 15–41)
Albumin: 3.5 g/dL (ref 3.5–5.0)
Alkaline Phosphatase: 55 U/L (ref 38–126)
Anion gap: 9 (ref 5–15)
BUN: 24 mg/dL — ABNORMAL HIGH (ref 8–23)
CO2: 22 mmol/L (ref 22–32)
Calcium: 8.2 mg/dL — ABNORMAL LOW (ref 8.9–10.3)
Chloride: 106 mmol/L (ref 98–111)
Creatinine, Ser: 1.7 mg/dL — ABNORMAL HIGH (ref 0.61–1.24)
GFR, Estimated: 37 mL/min — ABNORMAL LOW (ref >60)
Glucose, Bld: 128 mg/dL — ABNORMAL HIGH (ref 70–99)
Potassium: 3.3 mmol/L — ABNORMAL LOW (ref 3.5–5.1)
Sodium: 137 mmol/L (ref 135–145)
Total Bilirubin: 1.6 mg/dL — ABNORMAL HIGH (ref 0.3–1.2)
Total Protein: 7.1 g/dL (ref 6.5–8.1)

## 2022-02-09 LAB — CBC
HCT: 37.2 % — ABNORMAL LOW (ref 39.0–52.0)
Hemoglobin: 12.1 g/dL — ABNORMAL LOW (ref 13.0–17.0)
MCH: 29.2 pg (ref 26.0–34.0)
MCHC: 32.5 g/dL (ref 30.0–36.0)
MCV: 89.6 fL (ref 80.0–100.0)
Platelets: 176 10*3/uL (ref 150–400)
RBC: 4.15 MIL/uL — ABNORMAL LOW (ref 4.22–5.81)
RDW: 13.2 % (ref 11.5–15.5)
WBC: 13.2 10*3/uL — ABNORMAL HIGH (ref 4.0–10.5)
nRBC: 0 % (ref 0.0–0.2)

## 2022-02-09 LAB — LIPID PANEL
Cholesterol: 105 mg/dL (ref 0–200)
HDL: 39 mg/dL — ABNORMAL LOW (ref >40)
LDL Cholesterol: 55 mg/dL (ref 0–99)
Total CHOL/HDL Ratio: 2.7 RATIO
Triglycerides: 56 mg/dL (ref <150)
VLDL: 11 mg/dL (ref 0–40)

## 2022-02-09 LAB — MAGNESIUM: Magnesium: 1.9 mg/dL (ref 1.7–2.4)

## 2022-02-09 LAB — HEMOGLOBIN A1C
Hgb A1c MFr Bld: 6.2 % — ABNORMAL HIGH (ref 4.8–5.6)
Mean Plasma Glucose: 131.24 mg/dL

## 2022-02-09 LAB — BRAIN NATRIURETIC PEPTIDE: B Natriuretic Peptide: 525.3 pg/mL — ABNORMAL HIGH (ref 0.0–100.0)

## 2022-02-09 MED ORDER — ENOXAPARIN SODIUM 30 MG/0.3ML IJ SOSY
30.0000 mg | PREFILLED_SYRINGE | INTRAMUSCULAR | Status: DC
  Administered 2022-02-09 – 2022-02-14 (×6): 30 mg via SUBCUTANEOUS
  Filled 2022-02-09 (×6): qty 0.3

## 2022-02-09 MED ORDER — SODIUM CHLORIDE 0.9 % IV SOLN
1.0000 g | INTRAVENOUS | Status: DC
  Administered 2022-02-09 – 2022-02-13 (×5): 1 g via INTRAVENOUS
  Filled 2022-02-09 (×2): qty 1
  Filled 2022-02-09 (×3): qty 10

## 2022-02-09 MED ORDER — IPRATROPIUM-ALBUTEROL 0.5-2.5 (3) MG/3ML IN SOLN
3.0000 mL | Freq: Four times a day (QID) | RESPIRATORY_TRACT | Status: DC
  Administered 2022-02-09 (×2): 3 mL via RESPIRATORY_TRACT
  Filled 2022-02-09 (×2): qty 3

## 2022-02-09 MED ORDER — POLYETHYLENE GLYCOL 3350 17 G PO PACK
34.0000 g | PACK | ORAL | Status: AC
  Administered 2022-02-09 (×3): 34 g via ORAL
  Filled 2022-02-09 (×3): qty 2

## 2022-02-09 MED ORDER — AZITHROMYCIN 500 MG PO TABS
500.0000 mg | ORAL_TABLET | Freq: Every day | ORAL | Status: DC
  Administered 2022-02-09 – 2022-02-14 (×6): 500 mg via ORAL
  Filled 2022-02-09 (×3): qty 2
  Filled 2022-02-09 (×3): qty 1

## 2022-02-09 MED ORDER — PROCHLORPERAZINE EDISYLATE 10 MG/2ML IJ SOLN
10.0000 mg | Freq: Once | INTRAMUSCULAR | Status: AC
Start: 2022-02-09 — End: 2022-02-09
  Administered 2022-02-09: 10 mg via INTRAVENOUS
  Filled 2022-02-09: qty 2

## 2022-02-09 MED ORDER — IPRATROPIUM-ALBUTEROL 0.5-2.5 (3) MG/3ML IN SOLN
3.0000 mL | Freq: Four times a day (QID) | RESPIRATORY_TRACT | Status: DC
  Administered 2022-02-09: 3 mL via RESPIRATORY_TRACT
  Filled 2022-02-09: qty 3

## 2022-02-09 MED ORDER — ENOXAPARIN SODIUM 40 MG/0.4ML IJ SOSY: 40.0000 mg | PREFILLED_SYRINGE | INTRAMUSCULAR | Status: DC

## 2022-02-09 NOTE — Progress Notes (Signed)
  PROGRESS NOTE    Frederick Simon  [id number]:[id number] DOB: [birth year] DOA: 02/07/2022 PCP: Earnestine Mealing, MD  [id number]-AA  LOS: 2 days   Brief hospital course:   Assessment & Plan: 88 year old with history of HTN, HLD, GERD comes to the hospital from nursing facility with complaints of fevers, chills and weakness.  Also has some nausea.   Generalized weakness Likely due to PNA --mobility protocol  Elevated troponin 2/2 demand ischemia in the setting of infection and anemia.   Sepsis (HCC)-resolved as of 02/08/2022 Pt meets sepsis criteria and cont on iv abx.  We will follow cultures.  Prediabetes A1c 6.2. --no need for BG checks   Hypertension --hold home losartan 2/2 AKI  Stage 3b chronic kidney disease (HCC) With AKI.  Gastroesophageal reflux disease Cont oral PPI  AKI (acute kidney injury) (HCC) --Cr 2.13 on presentation, improved to 1.7 with IVF  CAP (community acquired pneumonia) --leukocytosis, procal 0.58, hypoxia, cough with blood-tinged sputum, CXR with Bilateral patchy opacities may reflect pneumonia.   --started on vanc/cefe/flagyl on presentation Plan: --switch abx to ceftriaxone and azithromycin    DVT prophylaxis: Lovenox SQ Code Status: DNR  Family Communication: daughter updated at bedside today Level of care: Progressive Dispo:   The patient is from: home Anticipated d/c is to: home Anticipated d/c date is: 1-2 days   Subjective and Interval History:  Pt reported intermittent N/V.  No dyspnea.  Coughing up thick blood-tinged sputum.   Objective: Vitals:   02/09/22 0742 02/09/22 1146 02/09/22 1413 02/09/22 1556  BP: (!) 155/89 (!) 147/93  126/76  Pulse: (!) 108 94  (!) 110  Resp: 18 18  16   Temp: 97.9 F (36.6 C) 98.1 F (36.7 C)  98.3 F (36.8 C)  TempSrc: Oral Oral  Oral  SpO2: 92% 92% 90% 92%  Weight:      Height:        Intake/Output Summary (Last 24 hours) at 02/09/2022 1710 Last data filed at 02/09/2022 1500 Gross per  24 hour  Intake 850 ml  Output 1200 ml  Net -350 ml   Filed Weights   02/07/22 1343 02/09/22 0442  Weight: 86.2 kg 89.8 kg    Examination:   Constitutional: NAD, AAOx3 HEENT: conjunctivae and lids normal, EOMI CV: No cyanosis.   RESP: normal respiratory effort, mild expiratory wheezes, on 2L SKIN: warm, dry Neuro: II - XII grossly intact.   Psych: Normal mood and affect.  Appropriate judgement and reason   Data Reviewed: I have personally reviewed labs and imaging studies  Time spent: 50 minutes  04/11/22, MD [facility] If 7PM-7AM, please contact night-coverage 02/09/2022, 5:10 PM

## 2022-02-09 NOTE — Assessment & Plan Note (Signed)
--  leukocytosis, procal 0.58, hypoxia, cough with blood-tinged sputum, CXR with Bilateral patchy opacities.  covid neg.   --started on vanc/cefe/flagyl on presentation, then switched to ceftriaxone and azithromycin --MRSA screen neg --procal and CXR worse despite being on abx, and having persistent fever Plan: --cont ceftriaxone and azithromycin for now --ID consult today

## 2022-02-09 NOTE — Consult Note (Signed)
Pharmacy Antibiotic Note  Frederick Simon is a 88 y.o. male admitted on 02/07/2022 with sepsis.  Pharmacy has been consulted for Vancomycin and Cefepime dosing. Patient received Vancomycin 1g IV and Cefepime 2g IV in ED and had Metronidazole 500mg  Q12 hours ordered.  Plan: Will order additional Vancomycin 750mg  IV x 1 to equal 1750mg  total loading dose. Deferring further doses as patient appears to have AKI. Will order random vancomycin level~24 hours after 750mg  dose given and continue to assess renal function daily.  9/2:  Vanc random @ 2200 = 7 mcg/mL  - Vancomycin 1750 mg IV X 1 given on 9/1 @ ~ 2100.  ~ 24 hrs later vanc level is subtherapeutic @ 7 mcg/mL.   -  Will order another Vanc 1750 mg IV X 1 and recheck vanc level in 12 hrs on 9/2 @ 1200.   Cefepime 2g Q24 hours.  Will continue to monitor renal function and adjust dosing as appropriate.  Height: 5\' 11"  (180.3 cm) Weight: 86.2 kg (190 lb) IBW/kg (Calculated) : 75.3  Temp (24hrs), Avg:99.3 F (37.4 C), Min:98.4 F (36.9 C), Max:100.9 F (38.3 C)  Recent Labs  Lab 02/07/22 1345 02/07/22 1522 02/07/22 1807 02/08/22 1017 02/08/22 2200  WBC 15.0*  --  12.0*  --   --   CREATININE 2.13*  --   --  1.86*  --   LATICACIDVEN  --  1.7 1.9  --   --   VANCORANDOM  --   --   --   --  7     Estimated Creatinine Clearance: 26.4 mL/min (A) (by C-G formula based on SCr of 1.86 mg/dL (H)).    No Known Allergies  Antimicrobials this admission: Vancomycin 8/31 >>  Cefepime 8/31 >>  Metronidazole 8/31 >>  Dose adjustments this admission: N/A  Microbiology results: 8/31 BCx: pending 8/31 UCx: pending    Thank you for allowing pharmacy to be a part of this patient's care.  Bionca Mckey D 02/09/2022 12:25 AM

## 2022-02-09 NOTE — Progress Notes (Signed)
PHARMACIST - PHYSICIAN COMMUNICATION  CONCERNING:  Enoxaparin (Lovenox) for DVT Prophylaxis    RECOMMENDATION: Patient was prescribed enoxaprin 40mg  q24 hours for VTE prophylaxis.   Filed Weights   02/07/22 1343 02/09/22 0442  Weight: 86.2 kg (190 lb) 89.8 kg (197 lb 15.6 oz)    Body mass index is 27.61 kg/m.  Estimated Creatinine Clearance: 28.9 mL/min (A) (by C-G formula based on SCr of 1.7 mg/dL (H)).  Patient is candidate for enoxaparin 30mg  every 24 hours based on CrCl <55ml/min or Weight <45kg  DESCRIPTION: Pharmacy has adjusted enoxaparin dose per [facility] policy.  Patient is now receiving enoxaparin 30 mg every 24 hours    31m, PharmD Clinical Pharmacist  02/09/2022 5:28 PM

## 2022-02-09 NOTE — Assessment & Plan Note (Signed)
--  Cr 2.13 on presentation, improved to 1.7 with IVF, but worsened again due to poor oral intake Plan: --cont NS@100  for 10 hours

## 2022-02-10 ENCOUNTER — Inpatient Hospital Stay: Payer: Medicare Other

## 2022-02-10 DIAGNOSIS — A419 Sepsis, unspecified organism: Secondary | ICD-10-CM

## 2022-02-10 DIAGNOSIS — E876 Hypokalemia: Secondary | ICD-10-CM

## 2022-02-10 DIAGNOSIS — J9601 Acute respiratory failure with hypoxia: Secondary | ICD-10-CM

## 2022-02-10 DIAGNOSIS — R6520 Severe sepsis without septic shock: Secondary | ICD-10-CM

## 2022-02-10 DIAGNOSIS — J189 Pneumonia, unspecified organism: Secondary | ICD-10-CM | POA: Diagnosis not present

## 2022-02-10 HISTORY — DX: Severe sepsis without septic shock: R65.20

## 2022-02-10 HISTORY — DX: Acute respiratory failure with hypoxia: J96.01

## 2022-02-10 HISTORY — DX: Severe sepsis without septic shock: A41.9

## 2022-02-10 LAB — BASIC METABOLIC PANEL
Anion gap: 9 (ref 5–15)
BUN: 27 mg/dL — ABNORMAL HIGH (ref 8–23)
CO2: 21 mmol/L — ABNORMAL LOW (ref 22–32)
Calcium: 8.1 mg/dL — ABNORMAL LOW (ref 8.9–10.3)
Chloride: 104 mmol/L (ref 98–111)
Creatinine, Ser: 1.77 mg/dL — ABNORMAL HIGH (ref 0.61–1.24)
GFR, Estimated: 35 mL/min — ABNORMAL LOW (ref >60)
Glucose, Bld: 152 mg/dL — ABNORMAL HIGH (ref 70–99)
Potassium: 3.3 mmol/L — ABNORMAL LOW (ref 3.5–5.1)
Sodium: 134 mmol/L — ABNORMAL LOW (ref 135–145)

## 2022-02-10 LAB — CBC
HCT: 36.6 % — ABNORMAL LOW (ref 39.0–52.0)
Hemoglobin: 12.1 g/dL — ABNORMAL LOW (ref 13.0–17.0)
MCH: 29.2 pg (ref 26.0–34.0)
MCHC: 33.1 g/dL (ref 30.0–36.0)
MCV: 88.4 fL (ref 80.0–100.0)
Platelets: 177 10*3/uL (ref 150–400)
RBC: 4.14 MIL/uL — ABNORMAL LOW (ref 4.22–5.81)
RDW: 13.4 % (ref 11.5–15.5)
WBC: 10.7 10*3/uL — ABNORMAL HIGH (ref 4.0–10.5)
nRBC: 0 % (ref 0.0–0.2)

## 2022-02-10 LAB — MAGNESIUM: Magnesium: 2.1 mg/dL (ref 1.7–2.4)

## 2022-02-10 LAB — STREP PNEUMONIAE URINARY ANTIGEN: Strep Pneumo Urinary Antigen: NEGATIVE

## 2022-02-10 LAB — PROCALCITONIN: Procalcitonin: 3.77 ng/mL

## 2022-02-10 MED ORDER — POLYETHYLENE GLYCOL 3350 17 G PO PACK
34.0000 g | PACK | Freq: Two times a day (BID) | ORAL | Status: DC
  Administered 2022-02-10 – 2022-02-15 (×10): 34 g via ORAL
  Filled 2022-02-10 (×11): qty 2

## 2022-02-10 MED ORDER — METOPROLOL TARTRATE 50 MG PO TABS
50.0000 mg | ORAL_TABLET | Freq: Two times a day (BID) | ORAL | Status: DC
  Administered 2022-02-10 – 2022-02-11 (×3): 50 mg via ORAL
  Filled 2022-02-10 (×3): qty 1

## 2022-02-10 MED ORDER — POTASSIUM CHLORIDE CRYS ER 20 MEQ PO TBCR
40.0000 meq | EXTENDED_RELEASE_TABLET | Freq: Once | ORAL | Status: AC
  Administered 2022-02-10: 40 meq via ORAL
  Filled 2022-02-10: qty 2

## 2022-02-10 MED ORDER — SODIUM CHLORIDE 0.9 % IV SOLN
12.5000 mg | Freq: Four times a day (QID) | INTRAVENOUS | Status: DC | PRN
  Administered 2022-02-10 – 2022-02-13 (×2): 12.5 mg via INTRAVENOUS
  Filled 2022-02-10: qty 0.5
  Filled 2022-02-10: qty 12.5

## 2022-02-10 MED ORDER — IPRATROPIUM-ALBUTEROL 0.5-2.5 (3) MG/3ML IN SOLN
3.0000 mL | Freq: Three times a day (TID) | RESPIRATORY_TRACT | Status: DC
  Administered 2022-02-10 (×3): 3 mL via RESPIRATORY_TRACT
  Filled 2022-02-10 (×3): qty 3

## 2022-02-10 NOTE — Assessment & Plan Note (Signed)
--  monitor and replete PRN ?

## 2022-02-10 NOTE — Assessment & Plan Note (Signed)
--  was put on 2L with intermittent desat to 88%

## 2022-02-10 NOTE — Assessment & Plan Note (Signed)
--  presented with tachycardia, leukocytosis, AKI

## 2022-02-10 NOTE — Progress Notes (Signed)
  PROGRESS NOTE    Frederick Simon  [id number]:[id number] DOB: Oct 15, 1926 DOA: 02/07/2022 PCP: Earnestine Mealing, MD  243A/243A-AA  LOS: 3 days   Brief hospital course:   Assessment & Plan: 88 year old with history of HTN, HLD, GERD comes to the hospital from nursing facility with complaints of fevers, chills and weakness.  Also has some nausea.   * CAP (community acquired pneumonia) --leukocytosis, procal 0.58, hypoxia, cough with blood-tinged sputum, CXR with Bilateral patchy opacities may reflect pneumonia.   --started on vanc/cefe/flagyl on presentation, then switched to ceftriaxone and azithromycin --MRSA screen neg Plan: --cont ceftriaxone and azithromycin for now --strep pneumo and legionella antigen  Generalized weakness Likely due to PNA --mobility protocol  Elevated troponin 2/2 demand ischemia in the setting of infection and anemia.   Sepsis (HCC)-resolved as of 02/08/2022 Pt meets sepsis criteria and cont on iv abx.  We will follow cultures.  Prediabetes A1c 6.2. --no need for BG checks   Hypertension --hold home losartan 2/2 AKI --started lopressor 50 mg BID instead (new)  Stage 3b chronic kidney disease (HCC) With AKI.  Gastroesophageal reflux disease Cont oral PPI  Severe sepsis (HCC) --presented with tachycardia, leukocytosis, AKI   AKI (acute kidney injury) (HCC) --Cr 2.13 on presentation, improved to 1.7 with IVF    DVT prophylaxis: Lovenox SQ Code Status: DNR  Family Communication: daughter updated at bedside today Level of care: Progressive Dispo:   The patient is from: home Anticipated d/c is to: home Anticipated d/c date is: 2-3 days   Subjective and Interval History:  Daughter reported worse N/V today.    Pt also developed fever to 100.9 today, CXR showed worsening airspace disease.  Discussed with oncall ID, will get blood cx and keep same abx for now.   Objective: Vitals:   02/10/22 1203 02/10/22 1308 02/10/22 1401 02/10/22  1510  BP: (!) 173/91 (!) 130/106 133/76 139/82  Pulse: (!) 105 98 90 88  Resp: (!) 40  (!) 26 (!) 24  Temp: (!) 100.4 F (38 C) 100.2 F (37.9 C) (!) 100.9 F (38.3 C) 99.1 F (37.3 C)  TempSrc: Oral Oral Oral Oral  SpO2: 90% 91% 91% 92%  Weight:      Height:        Intake/Output Summary (Last 24 hours) at 02/10/2022 1715 Last data filed at 02/10/2022 1100 Gross per 24 hour  Intake --  Output 2250 ml  Net -2250 ml   Filed Weights   02/07/22 1343 02/09/22 0442 02/10/22 0450  Weight: 86.2 kg 89.8 kg 89.7 kg    Examination:   Constitutional: NAD, more lethargic HEENT: conjunctivae and lids normal, EOMI CV: No cyanosis.   RESP: increased RR Extremities: No effusions, edema in BLE SKIN: warm, dry   Data Reviewed: I have personally reviewed labs and imaging studies  Time spent: 50 minutes  Darlin Priestly, MD [facility] If 7PM-7AM, please contact night-coverage 02/10/2022, 5:15 PM

## 2022-02-11 DIAGNOSIS — J189 Pneumonia, unspecified organism: Secondary | ICD-10-CM | POA: Diagnosis not present

## 2022-02-11 DIAGNOSIS — R112 Nausea with vomiting, unspecified: Secondary | ICD-10-CM

## 2022-02-11 HISTORY — DX: Nausea with vomiting, unspecified: R11.2

## 2022-02-11 LAB — CBC
HCT: 39.5 % (ref 39.0–52.0)
Hemoglobin: 12.9 g/dL — ABNORMAL LOW (ref 13.0–17.0)
MCH: 29.5 pg (ref 26.0–34.0)
MCHC: 32.7 g/dL (ref 30.0–36.0)
MCV: 90.2 fL (ref 80.0–100.0)
Platelets: 215 10*3/uL (ref 150–400)
RBC: 4.38 MIL/uL (ref 4.22–5.81)
RDW: 13.7 % (ref 11.5–15.5)
WBC: 10.9 10*3/uL — ABNORMAL HIGH (ref 4.0–10.5)
nRBC: 0 % (ref 0.0–0.2)

## 2022-02-11 LAB — BASIC METABOLIC PANEL
Anion gap: 12 (ref 5–15)
BUN: 34 mg/dL — ABNORMAL HIGH (ref 8–23)
CO2: 23 mmol/L (ref 22–32)
Calcium: 8.9 mg/dL (ref 8.9–10.3)
Chloride: 107 mmol/L (ref 98–111)
Creatinine, Ser: 1.93 mg/dL — ABNORMAL HIGH (ref 0.61–1.24)
GFR, Estimated: 32 mL/min — ABNORMAL LOW (ref >60)
Glucose, Bld: 136 mg/dL — ABNORMAL HIGH (ref 70–99)
Potassium: 3.3 mmol/L — ABNORMAL LOW (ref 3.5–5.1)
Sodium: 142 mmol/L (ref 135–145)

## 2022-02-11 LAB — PROCALCITONIN: Procalcitonin: 5.18 ng/mL

## 2022-02-11 LAB — SARS CORONAVIRUS 2 BY RT PCR: SARS Coronavirus 2 by RT PCR: NEGATIVE

## 2022-02-11 LAB — MAGNESIUM: Magnesium: 2.4 mg/dL (ref 1.7–2.4)

## 2022-02-11 MED ORDER — IPRATROPIUM-ALBUTEROL 0.5-2.5 (3) MG/3ML IN SOLN
3.0000 mL | Freq: Two times a day (BID) | RESPIRATORY_TRACT | Status: DC
  Administered 2022-02-11 – 2022-02-12 (×3): 3 mL via RESPIRATORY_TRACT
  Filled 2022-02-11 (×3): qty 3

## 2022-02-11 MED ORDER — FUROSEMIDE 10 MG/ML IJ SOLN
60.0000 mg | Freq: Once | INTRAMUSCULAR | Status: AC
  Administered 2022-02-11: 60 mg via INTRAVENOUS
  Filled 2022-02-11: qty 6

## 2022-02-11 MED ORDER — METOPROLOL TARTRATE 50 MG PO TABS
100.0000 mg | ORAL_TABLET | Freq: Two times a day (BID) | ORAL | Status: DC
  Administered 2022-02-11 – 2022-02-15 (×8): 100 mg via ORAL
  Filled 2022-02-11 (×8): qty 2

## 2022-02-11 MED ORDER — ONDANSETRON HCL 4 MG/2ML IJ SOLN
4.0000 mg | Freq: Three times a day (TID) | INTRAMUSCULAR | Status: DC
  Administered 2022-02-11 – 2022-02-14 (×7): 4 mg via INTRAVENOUS
  Filled 2022-02-11 (×7): qty 2

## 2022-02-11 NOTE — TOC Initial Note (Signed)
Transition of Care [facility]) - Initial/Assessment Note    Patient Details  Name: Frederick Simon MRN: [id number] Date of Birth: 14-Jun-1926  Transition of [facility]) CM/SW Contact:    Durenda Guthrie, RN Phone Number: 02/11/2022, 3:34 PM  Clinical Narrative:    Transition of Care Screening Note:             Transition of Care Department [facility]) has reviewed patient and no TOC needs have been identified at this time. We will continue to monitor patient advancement through Interdisciplinary progressions. If new patient transition needs arise, please place a consult.             Patient Goals and CMS Choice        Expected Discharge Plan and Services                                                Prior Living Arrangements/Services                       Activities of Daily Living Home Assistive Devices/Equipment: Cane (specify quad or straight) ADL Screening (condition at time of admission) Patient's cognitive ability adequate to safely complete daily activities?: Yes Is the patient deaf or have difficulty hearing?: Yes Does the patient have difficulty seeing, even when wearing glasses/contacts?: No Does the patient have difficulty concentrating, remembering, or making decisions?: Yes Patient able to express need for assistance with ADLs?: Yes Does the patient have difficulty dressing or bathing?: Yes Independently performs ADLs?: No Communication: Independent Dressing (OT): Independent Grooming: Independent Feeding: Independent Bathing: Independent Toileting: Needs assistance Is this a change from baseline?: Change from baseline, expected to last <3 days In/Out Bed: Needs assistance Is this a change from baseline?: Change from baseline, expected to last <3 days Walks in Home: Independent Does the patient have difficulty walking or climbing stairs?: Yes Weakness of Legs: None Weakness of Arms/Hands: None  Permission Sought/Granted                   Emotional Assessment              Admission diagnosis:  Chills [R68.83] AKI (acute kidney injury) (HCC) [N17.9] Sepsis, due to unspecified organism, unspecified whether acute organ dysfunction present (HCC) [A41.9] Nausea and vomiting, unspecified vomiting type [R11.2] Patient Active Problem List   Diagnosis Date Noted   Nausea & vomiting 02/11/2022   Severe sepsis (HCC) 02/10/2022   Hypokalemia 02/10/2022   Acute hypoxemic respiratory failure (HCC) 02/10/2022   CAP (community acquired pneumonia) 02/09/2022   AKI (acute kidney injury) (HCC) 02/09/2022   Prediabetes 02/07/2022   Generalized weakness 02/07/2022   Elevated troponin 02/07/2022   Stage 3b chronic kidney disease (HCC) 03/08/2021   Mild cognitive impairment 08/03/2020   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

## 2022-02-11 NOTE — Assessment & Plan Note (Signed)
--  maybe due to constipation or GI viral illness Plan: --cont scheduled Miralax --schedule IV zofran TID before meals

## 2022-02-11 NOTE — Progress Notes (Signed)
  PROGRESS NOTE    Frederick Simon  VFI:[id number] DOB: 11/24/1926 DOA: 02/07/2022 PCP: Earnestine Mealing, MD  130A/130A-AA  LOS: 4 days   Brief hospital course:   Assessment & Plan: 88 year old with history of HTN, HLD, GERD comes to the hospital from nursing facility with complaints of fevers, chills and weakness.  Also has some nausea.   * CAP (community acquired pneumonia) --leukocytosis, procal 0.58, hypoxia, cough with blood-tinged sputum, CXR with Bilateral patchy opacities may reflect pneumonia.   --started on vanc/cefe/flagyl on presentation, then switched to ceftriaxone and azithromycin --MRSA screen neg --procal and CXR worse despite being on abx, and having persistent fever Plan: --cont ceftriaxone and azithromycin for now --strep pneumo and legionella antigen --ID consult tomorrow  Generalized weakness Likely due to PNA --mobility protocol  Elevated troponin 2/2 demand ischemia in the setting of infection and anemia.   Sepsis (HCC)-resolved as of 02/08/2022 Pt meets sepsis criteria and cont on iv abx.  We will follow cultures.  Prediabetes A1c 6.2. --no need for BG checks   Hypertension --hold home losartan 2/2 AKI --increase Lopressor to 100 mg BID (new)  Stage 3b chronic kidney disease (HCC) With AKI.  Gastroesophageal reflux disease Cont oral PPI  Nausea & vomiting --maybe due to constipation or GI viral illness Plan: --cont scheduled Miralax --schedule IV zofran TID before meals  Acute hypoxemic respiratory failure (HCC) --was put on 2L with intermittent desat to 88%  Hypokalemia --monitor and replete PRN  Severe sepsis (HCC) --presented with tachycardia, leukocytosis, AKI   AKI (acute kidney injury) (HCC) --Cr 2.13 on presentation, improved to 1.7 with IVF    DVT prophylaxis: Lovenox SQ Code Status: DNR  Family Communication: daughter updated at bedside today Level of care: Med-Surg Dispo:   The patient is from:  home Anticipated d/c is to: home Anticipated d/c date is: 2-3 days   Subjective and Interval History:  Pt's main complaint was persistent nausea.  No diarrhea.  Continues to have fever.   Objective: Vitals:   02/11/22 0708 02/11/22 0803 02/11/22 1039 02/11/22 1250  BP: (!) 194/98  (!) 164/94 106/75  Pulse: (!) 101  (!) 108 96  Resp: 18  20 20   Temp: 98.2 F (36.8 C)  (!) 101.2 F (38.4 C) 98.3 F (36.8 C)  TempSrc: Oral     SpO2: 93% 92% 93% 92%  Weight:      Height:        Intake/Output Summary (Last 24 hours) at 02/11/2022 1430 Last data filed at 02/11/2022 1100 Gross per 24 hour  Intake 0 ml  Output 1250 ml  Net -1250 ml   Filed Weights   02/09/22 0442 02/10/22 0450 02/11/22 0410  Weight: 89.8 kg 89.7 kg 90.5 kg    Examination:   Constitutional: NAD, alert, oriented HEENT: conjunctivae and lids normal, EOMI, hard of hearing CV: No cyanosis.   RESP: normal respiratory effort, on 2L SKIN: warm, dry Neuro: II - XII grossly intact.   Psych: Normal mood and affect.     Data Reviewed: I have personally reviewed labs and imaging studies  Time spent: 50 minutes  04/13/22, MD [facility] If 7PM-7AM, please contact night-coverage 02/11/2022, 2:30 PM

## 2022-02-11 NOTE — Care Management Important Message (Signed)
Important Message  Patient Details  Name: Frederick Simon MRN: [id number] Date of Birth: 04/13/1927   Medicare Important Message Given:  N/A - LOS <3 / Initial given by admissions     Olegario Messier A [name] 02/11/2022, 10:25 AM

## 2022-02-12 ENCOUNTER — Inpatient Hospital Stay: Payer: Medicare Other

## 2022-02-12 DIAGNOSIS — N179 Acute kidney failure, unspecified: Secondary | ICD-10-CM | POA: Diagnosis not present

## 2022-02-12 DIAGNOSIS — J189 Pneumonia, unspecified organism: Secondary | ICD-10-CM | POA: Diagnosis not present

## 2022-02-12 LAB — CBC
HCT: 39.5 % (ref 39.0–52.0)
Hemoglobin: 12.8 g/dL — ABNORMAL LOW (ref 13.0–17.0)
MCH: 29.3 pg (ref 26.0–34.0)
MCHC: 32.4 g/dL (ref 30.0–36.0)
MCV: 90.4 fL (ref 80.0–100.0)
Platelets: 207 10*3/uL (ref 150–400)
RBC: 4.37 MIL/uL (ref 4.22–5.81)
RDW: 13.9 % (ref 11.5–15.5)
WBC: 8.3 10*3/uL (ref 4.0–10.5)
nRBC: 0 % (ref 0.0–0.2)

## 2022-02-12 LAB — BASIC METABOLIC PANEL
Anion gap: 8 (ref 5–15)
BUN: 49 mg/dL — ABNORMAL HIGH (ref 8–23)
CO2: 24 mmol/L (ref 22–32)
Calcium: 8.4 mg/dL — ABNORMAL LOW (ref 8.9–10.3)
Chloride: 109 mmol/L (ref 98–111)
Creatinine, Ser: 2.06 mg/dL — ABNORMAL HIGH (ref 0.61–1.24)
GFR, Estimated: 29 mL/min — ABNORMAL LOW (ref >60)
Glucose, Bld: 123 mg/dL — ABNORMAL HIGH (ref 70–99)
Potassium: 3.1 mmol/L — ABNORMAL LOW (ref 3.5–5.1)
Sodium: 141 mmol/L (ref 135–145)

## 2022-02-12 LAB — CULTURE, BLOOD (SINGLE)
Culture: NO GROWTH
Special Requests: ADEQUATE

## 2022-02-12 LAB — PROCALCITONIN: Procalcitonin: 4.83 ng/mL

## 2022-02-12 LAB — MAGNESIUM: Magnesium: 2.6 mg/dL — ABNORMAL HIGH (ref 1.7–2.4)

## 2022-02-12 MED ORDER — HYDROCORTISONE 1 % EX CREA
TOPICAL_CREAM | Freq: Two times a day (BID) | CUTANEOUS | Status: DC
  Filled 2022-02-12: qty 28

## 2022-02-12 MED ORDER — POTASSIUM CHLORIDE CRYS ER 20 MEQ PO TBCR
40.0000 meq | EXTENDED_RELEASE_TABLET | ORAL | Status: AC
  Administered 2022-02-12 (×2): 40 meq via ORAL
  Filled 2022-02-12 (×2): qty 2

## 2022-02-12 MED ORDER — HYDRALAZINE HCL 50 MG PO TABS
50.0000 mg | ORAL_TABLET | Freq: Three times a day (TID) | ORAL | Status: DC
  Administered 2022-02-12 – 2022-02-15 (×10): 50 mg via ORAL
  Filled 2022-02-12 (×10): qty 1

## 2022-02-12 MED ORDER — SODIUM CHLORIDE 0.9 % IV SOLN: INTRAVENOUS | Status: DC

## 2022-02-12 NOTE — Consult Note (Signed)
NAME: Frederick Simon  DOB: 05-10-1927  MRN: [id number]  Date/Time: 02/12/2022 1:41 PM  REQUESTING PROVIDER: Kateri Plummer Subjective:  REASON FOR CONSULT: pneumonia ? Frederick Simon is a 88 y.o. with a history of HTN, HLD , a [facility] resident  was doing well until 01/3022. That day he his daughter had taken him to buy shoes, then they went to a [place]. HE felt unwell there with chills and was brought back to [facility] Fever with chills sudden onset with nausea and vomiting and was brought to the ED on 02/07/22   02/07/22 13:43  BP 165/90 !  Temp 99.5 F (37.5 C)  Pulse Rate 119 !  Resp 18  SpO2 92 %    Latest Reference Range & Units 02/07/22 13:45  WBC 4.0 - 10.5 K/uL 15.0 (H)  Hemoglobin 13.0 - 17.0 g/dL 99.8  HCT 33.8 - 25.0 % 40.6  Platelets 150 - 400 K/uL 187  Creatinine 0.61 - 1.24 mg/dL 5.39 (H)   COIVD test neg CT abd /pelvis- stool in colon B/l lower lobe infiltrate VS atelectasis CXR left mid lung opacity HE was started on IV antibiotics  cefepime + vanco which was changed to ceftriaxone and azithromycin on 02/09/22 Past Medical History:  Diagnosis Date   Allergic rhinitis    Gastroesophageal reflux disease    Hyperlipidemia    Hypertension     Past Surgical History:  Procedure Laterality Date   BACK SURGERY  May 1975    Dr. Hilton Cork SURGERY  Feb 2004   Dr. Newell Coral    HAND SURGERY  [phone]   Dr. Priscille Kluver   NOSE SURGERY  1989   Dr. Ned Grace SURGERY  [phone]   Dr. Priscille Kluver    Social History   Socioeconomic History   Marital status: Widowed    Spouse name: Not on file   Number of children: 4   Years of education: Not on file   Highest education level: Not on file  Occupational History   Occupation: Geologist, engineering: [employer]  Tobacco Use   Smoking status: Former   Smokeless tobacco: Never   Tobacco comments:    quit 1980's  Substance and Sexual Activity   Alcohol use: No   Drug use: No   Sexual activity:  Not on file  Other Topics Concern   Not on file  Social History Narrative   4 daughters      Has advanced directives   Daughter [name] is health care POA   Requests DNR--done 08/03/20   No feeding tube if cognitively unaware   Social Determinants of Health   Financial Resource Strain: Not on file  Food Insecurity: Not on file  Transportation Needs: Not on file  Physical Activity: Not on file  Stress: Not on file  Social Connections: Not on file  Intimate Partner Violence: Not on file    Family History  Problem Relation Age of Onset   Cancer Mother    Hypertension Mother    Hypertension Father    Stroke Father    Lupus Sister    No Known Allergies I? Current Facility-Administered Medications  Medication Dose Route Frequency Provider Last Rate Last Admin   acetaminophen (TYLENOL) tablet 1,000 mg  1,000 mg Oral Q6H PRN Gertha Calkin, MD   1,000 mg at 02/11/22 1802   azithromycin (ZITHROMAX) tablet 500 mg  500 mg Oral Daily Darlin Priestly, MD   500 mg at 02/12/22 [phone]  cefTRIAXone (ROCEPHIN) 1 g in sodium chloride 0.9 % 100 mL IVPB  1 g Intravenous Q24H Darlin Priestly, MD 200 mL/hr at 02/12/22 0942 1 g at 02/12/22 0942   dextromethorphan-guaiFENesin (MUCINEX DM) 30-600 MG per 12 hr tablet 1 tablet  1 tablet Oral BID PRN Dimple Nanas, MD   1 tablet at 02/12/22 0923   docusate sodium (COLACE) capsule 100 mg  100 mg Oral BID Amin, Ankit Chirag, MD   100 mg at 02/12/22 0923   enoxaparin (LOVENOX) injection 30 mg  30 mg Subcutaneous Q24H Foye Deer, RPH   30 mg at 02/11/22 2213   hydrALAZINE (APRESOLINE) injection 10 mg  10 mg Intravenous Q4H PRN Amin, Ankit Chirag, MD   10 mg at 02/11/22 0813   hydrALAZINE (APRESOLINE) tablet 50 mg  50 mg Oral TID Darlin Priestly, MD   50 mg at 02/12/22 1148   ibuprofen (ADVIL) tablet 400 mg  400 mg Oral Q6H PRN Amin, Ankit Chirag, MD   400 mg at 02/10/22 1326   ipratropium-albuterol (DUONEB) 0.5-2.5 (3) MG/3ML nebulizer solution 3 mL  3 mL Nebulization  Q4H PRN Amin, Ankit Chirag, MD   3 mL at 02/09/22 0619   metoprolol tartrate (LOPRESSOR) injection 5 mg  5 mg Intravenous Q4H PRN Amin, Ankit Chirag, MD       metoprolol tartrate (LOPRESSOR) tablet 100 mg  100 mg Oral BID Darlin Priestly, MD   100 mg at 02/12/22 0923   ondansetron (ZOFRAN) injection 4 mg  4 mg Intravenous TID Riley Lam, MD   4 mg at 02/12/22 1149   polyethylene glycol (MIRALAX / GLYCOLAX) packet 34 g  34 g Oral BID Darlin Priestly, MD   34 g at 02/12/22 3220   potassium chloride SA (KLOR-CON M) CR tablet 40 mEq  40 mEq Oral Q4H Darlin Priestly, MD   40 mEq at 02/12/22 1148   promethazine (PHENERGAN) 12.5 mg in sodium chloride 0.9 % 50 mL IVPB  12.5 mg Intravenous Q6H PRN Darlin Priestly, MD 200 mL/hr at 02/10/22 1113 12.5 mg at 02/10/22 1113   senna-docusate (Senokot-S) tablet 1 tablet  1 tablet Oral QHS PRN Amin, Ankit Chirag, MD       simvastatin (ZOCOR) tablet 20 mg  20 mg Oral Daily Manuela Schwartz, NP   20 mg at 02/12/22 2542   traZODone (DESYREL) tablet 50 mg  50 mg Oral QHS PRN Amin, Loura Halt, MD         Abtx:  Anti-infectives (From admission, onward)    Start     Dose/Rate Route Frequency Ordered Stop   02/09/22 1000  azithromycin (ZITHROMAX) tablet 500 mg        500 mg Oral Daily 02/09/22 0811     02/09/22 0900  cefTRIAXone (ROCEPHIN) 1 g in sodium chloride 0.9 % 100 mL IVPB        1 g 200 mL/hr over 30 Minutes Intravenous Every 24 hours 02/09/22 0811     02/09/22 0030  vancomycin (VANCOREADY) IVPB 1750 mg/350 mL        1,750 mg 175 mL/hr over 120 Minutes Intravenous  Once 02/08/22 2331 02/09/22 0147   02/08/22 1600  ceFEPIme (MAXIPIME) 2 g in sodium chloride 0.9 % 100 mL IVPB  Status:  Discontinued        2 g 200 mL/hr over 30 Minutes Intravenous Every 24 hours 02/07/22 2037 02/09/22 0811   02/08/22 0600  metroNIDAZOLE (FLAGYL) IVPB 500 mg  Status:  Discontinued  500 mg 100 mL/hr over 60 Minutes Intravenous Every 12 hours 02/07/22 1933 02/09/22 0811   02/07/22 2038   vancomycin variable dose per unstable renal function (pharmacist dosing)  Status:  Discontinued         Does not apply See admin instructions 02/07/22 2038 02/09/22 0811   02/07/22 2000  vancomycin (VANCOREADY) IVPB 750 mg/150 mL        750 mg 150 mL/hr over 60 Minutes Intravenous  Once 02/07/22 1954 02/07/22 2245   02/07/22 1530  ceFEPIme (MAXIPIME) 2 g in sodium chloride 0.9 % 100 mL IVPB        2 g 200 mL/hr over 30 Minutes Intravenous  Once 02/07/22 1515 02/07/22 1612   02/07/22 1530  metroNIDAZOLE (FLAGYL) IVPB 500 mg        500 mg 100 mL/hr over 60 Minutes Intravenous  Once 02/07/22 1515 02/07/22 1655   02/07/22 1530  vancomycin (VANCOCIN) IVPB 1000 mg/200 mL premix        1,000 mg 200 mL/hr over 60 Minutes Intravenous  Once 02/07/22 1515 02/07/22 1808       REVIEW OF SYSTEMS:  Const:  fever,  chills, negative weight loss Eyes: negative diplopia or visual changes, negative eye pain ENT: negative coryza, negative sore throat Resp:  cough, blood tinged sputum, dyspnea Cards: negative for chest pain, palpitations, lower extremity edema GU: negative for frequency, dysuria and hematuria GI: nausea , vomiting, some abdominal discomfort Skin: negative for rash and pruritus Heme: negative for easy bruising and gum/nose bleeding MS: weakness Neurolo:negative for headaches, dizziness, vertigo, memory problems  Psych: negative for feelings of anxiety, depression  Endocrine: negative for thyroid, diabetes Allergy/Immunology- negative for any medication or food allergies ? Objective:  VITALS:  Patient Vitals for the past 24 hrs:  BP Temp Temp src Pulse Resp SpO2  02/12/22 0843 (!) 169/98 98 F (36.7 C) Oral 86 20 92 %  02/12/22 0728 -- -- -- -- -- 93 %  02/12/22 0631 (!) 189/93 (!) 97.5 F (36.4 C) -- 71 18 96 %  02/11/22 2105 -- -- -- -- -- 94 %  02/11/22 2023 (!) 143/85 98.5 F (36.9 C) -- 88 18 94 %  02/11/22 1758 -- 99 F (37.2 C) -- -- -- --  02/11/22 1541 (!) 137/92  98.4 F (36.9 C) -- 92 (!) 22 93 %     PHYSICAL EXAM:  General: Alert, cooperative, no distress,  Head: Normocephalic, without obvious abnormality, atraumatic. Eyes: Conjunctivae clear, anicteric sclerae. Pupils are equal ENT Nares normal. No drainage or sinus tenderness. Lips, mucosa, and tongue normal. No Thrush Neck: Supple, symmetrical, no adenopathy, thyroid: non tender no carotid bruit and no JVD. Back: No CVA tenderness. Lungs b/l rhonchi Heart: s1s2 Abdomen: Soft, non-tender,not distended. Bowel sounds normal. No masses Extremities: atraumatic, no cyanosis. No edema. No clubbing Skin: No rashes or lesions. Or bruising Lymph: Cervical, supraclavicular normal. Neurologic: Grossly non-focal Pertinent Labs Lab Results    Latest Ref Rng & Units 02/12/2022    7:08 AM 02/11/2022    5:09 AM 02/10/2022    5:22 AM  CBC  WBC 4.0 - 10.5 K/uL 8.3  10.9  10.7   Hemoglobin 13.0 - 17.0 g/dL 40.912.8  81.112.9  91.412.1   Hematocrit 39.0 - 52.0 % 39.5  39.5  36.6   Platelets 150 - 400 K/uL 207  215  177         Latest Ref Rng & Units 02/12/2022    7:08 AM 02/11/2022  5:09 AM 02/10/2022    5:22 AM  CMP  Glucose 70 - 99 mg/dL 045  409  811   BUN 8 - 23 mg/dL 49  34  27   Creatinine 0.61 - 1.24 mg/dL 9.14  7.82  9.56   Sodium 135 - 145 mmol/L 141  142  134   Potassium 3.5 - 5.1 mmol/L 3.1  3.3  3.3   Chloride 98 - 111 mmol/L 109  107  104   CO2 22 - 32 mmol/L Calcium 8.9 - 10.3 mg/dL 8.4  8.9  8.1       Microbiology: Recent Results (from the past 240 hour(s))  Culture, blood (single)     Status: None   Collection Time: 02/07/22  3:15 PM   Specimen: BLOOD  Result Value Ref Range Status   Specimen Description BLOOD RIGHT ANTECUBITAL  Final   Special Requests   Final    BOTTLES DRAWN AEROBIC AND ANAEROBIC Blood Culture adequate volume   Culture   Final    NO GROWTH 5 DAYS Performed at [facility], [address] [place], [place] [address]    Report Status  02/12/2022 FINAL  Final  Urine Culture     Status: Abnormal   Collection Time: 02/07/22  6:26 PM   Specimen: Urine, Clean Catch  Result Value Ref Range Status   Specimen Description   Final    URINE, CLEAN CATCH Performed at [facility], [address] [place], [place] [address]    Special Requests   Final    NONE Performed at [facility], [address] [place], [place] [address]    Culture MULTIPLE SPECIES PRESENT, SUGGEST RECOLLECTION (A)  Final   Report Status 02/09/2022 FINAL  Final  Respiratory (~20 pathogens) panel by PCR     Status: None   Collection Time: 02/08/22 10:17 AM   Specimen: Nasopharyngeal Swab; Respiratory  Result Value Ref Range Status   Adenovirus NOT DETECTED NOT DETECTED Final   Coronavirus 229E NOT DETECTED NOT DETECTED Final    Comment: (NOTE) The Coronavirus on the Respiratory Panel, DOES NOT test for the novel  Coronavirus (2019 nCoV)    Coronavirus HKU1 NOT DETECTED NOT DETECTED Final   Coronavirus NL63 NOT DETECTED NOT DETECTED Final   Coronavirus OC43 NOT DETECTED NOT DETECTED Final   Metapneumovirus NOT DETECTED NOT DETECTED Final   Rhinovirus / Enterovirus NOT DETECTED NOT DETECTED Final   Influenza A NOT DETECTED NOT DETECTED Final   Influenza B NOT DETECTED NOT DETECTED Final   Parainfluenza Virus 1 NOT DETECTED NOT DETECTED Final   Parainfluenza Virus 2 NOT DETECTED NOT DETECTED Final   Parainfluenza Virus 3 NOT DETECTED NOT DETECTED Final   Parainfluenza Virus 4 NOT DETECTED NOT DETECTED Final   Respiratory Syncytial Virus NOT DETECTED NOT DETECTED Final   Bordetella pertussis NOT DETECTED NOT DETECTED Final   Bordetella Parapertussis NOT DETECTED NOT DETECTED Final   Chlamydophila pneumoniae NOT DETECTED NOT DETECTED Final   Mycoplasma pneumoniae NOT DETECTED NOT DETECTED Final    Comment: Performed at [facility], [address] [place], [place] [address]  SARS Coronavirus 2 by RT PCR (hospital order,  performed in [facility] hospital lab) *cepheid single result test* Anterior Nasal Swab     Status: None   Collection Time: 02/08/22 10:17 AM   Specimen: Anterior Nasal Swab  Result Value Ref Range Status   SARS Coronavirus 2 by RT  PCR NEGATIVE NEGATIVE Final    Comment: (NOTE) SARS-CoV-2 target nucleic acids are NOT DETECTED.  The SARS-CoV-2 RNA is generally detectable in upper and lower respiratory specimens during the acute phase of infection. The lowest concentration of SARS-CoV-2 viral copies this assay can detect is 250 copies / mL. A negative result does not preclude SARS-CoV-2 infection and should not be used as the sole basis for treatment or other patient management decisions.  A negative result may occur with improper specimen collection / handling, submission of specimen other than nasopharyngeal swab, presence of viral mutation(s) within the areas targeted by this assay, and inadequate number of viral copies (<250 copies / mL). A negative result must be combined with clinical observations, patient history, and epidemiological information.  Fact Sheet for Patients:   RoadLapTop.co.za  Fact Sheet for Healthcare Providers: http://kim-miller.com/  This test is not yet approved or  cleared by the [place] FDA and has been authorized for detection and/or diagnosis of SARS-CoV-2 by FDA under an Emergency Use Authorization (EUA).  This EUA will remain in effect (meaning this test can be used) for the duration of the COVID-19 declaration under Section 564(b)(1) of the Act, 21 U.S.C. section 360bbb-3(b)(1), unless the authorization is terminated or revoked sooner.  Performed at [facility], [address] [place], [place] [address]   MRSA Next Gen by PCR, Nasal     Status: None   Collection Time: 02/08/22 10:17 AM   Specimen: Nasal Mucosa; Nasal Swab  Result Value Ref Range Status   MRSA by PCR Next Gen NOT  DETECTED NOT DETECTED Final    Comment: (NOTE) The GeneXpert MRSA Assay (FDA approved for NASAL specimens only), is one component of a comprehensive MRSA colonization surveillance program. It is not intended to diagnose MRSA infection nor to guide or monitor treatment for MRSA infections. Test performance is not FDA approved in patients less than 32 years old. Performed at [facility], [address] [place], [place] [address]   Culture, blood (Routine X 2) w Reflex to ID Panel     Status: None (Preliminary result)   Collection Time: 02/10/22  5:12 PM   Specimen: BLOOD  Result Value Ref Range Status   Specimen Description BLOOD BLOOD LEFT ARM  Final   Special Requests   Final    BOTTLES DRAWN AEROBIC AND ANAEROBIC Blood Culture adequate volume   Culture   Final    NO GROWTH 2 DAYS Performed at [facility], [address], [place], [place] [address]    Report Status PENDING  Incomplete  Culture, blood (Routine X 2) w Reflex to ID Panel     Status: None (Preliminary result)   Collection Time: 02/10/22  5:16 PM   Specimen: BLOOD  Result Value Ref Range Status   Specimen Description BLOOD BLOOD RIGHT ARM  Final   Special Requests   Final    BOTTLES DRAWN AEROBIC AND ANAEROBIC Blood Culture adequate volume   Culture   Final    NO GROWTH 2 DAYS Performed at [facility], [address], [place], [place] [address]    Report Status PENDING  Incomplete  SARS Coronavirus 2 by RT PCR (hospital order, performed in [facility] hospital lab) *cepheid single result test* Anterior Nasal Swab     Status: None   Collection Time: 02/11/22  3:20 PM   Specimen: Anterior Nasal Swab  Result Value Ref Range Status   SARS Coronavirus 2 by RT PCR NEGATIVE NEGATIVE Final  Comment: (NOTE) SARS-CoV-2 target nucleic acids are NOT DETECTED.  The SARS-CoV-2 RNA is generally detectable in upper and lower respiratory specimens during the acute phase of infection. The  lowest concentration of SARS-CoV-2 viral copies this assay can detect is 250 copies / mL. A negative result does not preclude SARS-CoV-2 infection and should not be used as the sole basis for treatment or other patient management decisions.  A negative result may occur with improper specimen collection / handling, submission of specimen other than nasopharyngeal swab, presence of viral mutation(s) within the areas targeted by this assay, and inadequate number of viral copies (<250 copies / mL). A negative result must be combined with clinical observations, patient history, and epidemiological information.  Fact Sheet for Patients:   RoadLapTop.co.za  Fact Sheet for Healthcare Providers: http://kim-miller.com/  This test is not yet approved or  cleared by the [place] FDA and has been authorized for detection and/or diagnosis of SARS-CoV-2 by FDA under an Emergency Use Authorization (EUA).  This EUA will remain in effect (meaning this test can be used) for the duration of the COVID-19 declaration under Section 564(b)(1) of the Act, 21 U.S.C. section 360bbb-3(b)(1), unless the authorization is terminated or revoked sooner.  Performed at [facility], [address] [place], [place] [address]     IMAGING RESULTS:  I have personally reviewed the films ? Impression/Recommendation ?88 yr male presenting with fever, chills, nausea , vomiting, cough and sob  Left middle lobe pneumonia  Likely bacterial COVID/other viral PCR negative Send sputum for culture  Leucocytosis resolved continue ceftriaxone and azithromycin If fever recurs get CT chest  Nausea and vomiting - improved  AKI- worsening- could be from NSAID and Diuretic   HTN on meds  Discussed the management with patient and his daughter and hospitalist ? ? Note:  This document was prepared using Dragon voice recognition software and may include  unintentional dictation errors.

## 2022-02-12 NOTE — Progress Notes (Signed)
  PROGRESS NOTE    Frederick Simon  WUJ:[id number] DOB: Nov 02, 1926 DOA: 02/07/2022 PCP: Earnestine Mealing, MD  130A/130A-AA  LOS: 5 days   Brief hospital course:   Assessment & Plan: 88 year old with history of HTN, HLD, GERD comes to the hospital from nursing facility with complaints of fevers, chills and weakness.  Also has some nausea.   * CAP (community acquired pneumonia) --leukocytosis, procal 0.58, hypoxia, cough with blood-tinged sputum, CXR with Bilateral patchy opacities.  covid neg.   --started on vanc/cefe/flagyl on presentation, then switched to ceftriaxone and azithromycin --MRSA screen neg --procal and CXR worse despite being on abx, and having persistent fever Plan: --cont ceftriaxone and azithromycin for now --ID consult today  Generalized weakness Likely due to PNA --PT  Elevated troponin 2/2 demand ischemia in the setting of infection and anemia.   Sepsis (HCC)-resolved as of 02/08/2022 Pt meets sepsis criteria and cont on iv abx.  We will follow cultures.  Prediabetes A1c 6.2. --no need for BG checks   Hypertension uncontrolled --hold home losartan 2/2 AKI --cont Lopressor to 100 mg BID (new) --start hydralazine 50 mg TID  Stage 3b chronic kidney disease (HCC) With AKI.  Gastroesophageal reflux disease Cont oral PPI  Nausea & vomiting --maybe due to constipation or GI viral illness Plan: --cont scheduled Miralax --schedule IV zofran TID before meals  Acute hypoxemic respiratory failure (HCC) --was put on 2L with intermittent desat to 88%  Hypokalemia --monitor and replete PRN  Severe sepsis (HCC) --presented with tachycardia, leukocytosis, AKI   AKI (acute kidney injury) (HCC) --Cr 2.13 on presentation, improved to 1.7 with IVF, but worsened again due to poor oral intake Plan: --cont NS@100  for 10 hours    DVT prophylaxis: Lovenox SQ Code Status: DNR  Family Communication: ID updated daughters Level of care: Med-Surg Dispo:    The patient is from: home Anticipated d/c is to: home Anticipated d/c date is: 2-3 days   Subjective and Interval History:  Poor oral intake due to persistent nausea.  No vomiting, no diarrhea.  Noted rash over upper back today.  ID consult today   Objective: Vitals:   02/12/22 0631 02/12/22 0728 02/12/22 0843 02/12/22 1534  BP: (!) 189/93  (!) 169/98 (!) 144/80  Pulse: 71  86 72  Resp: 18  20 18   Temp: (!) 97.5 F (36.4 C)  98 F (36.7 C) 98.2 F (36.8 C)  TempSrc:   Oral   SpO2: 96% 93% 92% 93%  Weight:      Height:        Intake/Output Summary (Last 24 hours) at 02/12/2022 2019 Last data filed at 02/12/2022 1520 Gross per 24 hour  Intake 390.33 ml  Output 600 ml  Net -209.67 ml   Filed Weights   02/09/22 0442 02/10/22 0450 02/11/22 0410  Weight: 89.8 kg 89.7 kg 90.5 kg    Examination:   Constitutional: NAD, AAOx3 HEENT: conjunctivae and lids normal, EOMI, hard of hearing CV: No cyanosis.   RESP: normal respiratory effort, on 3L Extremities: No effusions, edema in BLE SKIN: warm, dry.  Macular rash over upper back Neuro: II - XII grossly intact.   Psych: Normal mood and affect.     Data Reviewed: I have personally reviewed labs and imaging studies  Time spent: 50 minutes  04/13/22, MD [facility] If 7PM-7AM, please contact night-coverage 02/12/2022, 8:19 PM

## 2022-02-12 NOTE — Progress Notes (Addendum)
Mobility Specialist - Progress Note   02/12/22 1200  Mobility  Activity Transferred to/from [facility];Ambulated with assistance in room  Level of Assistance Minimal assist, patient does 75% or more  Assistive Device Front wheel walker;None  Distance Ambulated (ft) 4 ft  Activity Response Tolerated well  $Mobility charge 1 Mobility     During mobility: 71 HR, 87% SpO2 Post-mobility: 68 HR, 91% SpO2   Pt lying in bed upon arrival, utilizing 3L. Pt able to come into sitting with minA. Stood from bed and SPT without AD requiring min-modA d/t posterior lean in standing. B knees not fully extended. RW provided and pt able to stand from [facility] with minG and posture somewhat improved with VC. Cues for sequencing steps with AD. Denied dizziness and SOB despite desatting O2. Pt left in bed with alarm set, needs in reach.    Frederick Simon Mobility Specialist 02/12/22, 12:14 PM

## 2022-02-13 DIAGNOSIS — J189 Pneumonia, unspecified organism: Secondary | ICD-10-CM | POA: Diagnosis not present

## 2022-02-13 LAB — CBC
HCT: 37.4 % — ABNORMAL LOW (ref 39.0–52.0)
Hemoglobin: 12.1 g/dL — ABNORMAL LOW (ref 13.0–17.0)
MCH: 29.3 pg (ref 26.0–34.0)
MCHC: 32.4 g/dL (ref 30.0–36.0)
MCV: 90.6 fL (ref 80.0–100.0)
Platelets: 211 10*3/uL (ref 150–400)
RBC: 4.13 MIL/uL — ABNORMAL LOW (ref 4.22–5.81)
RDW: 13.9 % (ref 11.5–15.5)
WBC: 5.9 10*3/uL (ref 4.0–10.5)
nRBC: 0 % (ref 0.0–0.2)

## 2022-02-13 LAB — BASIC METABOLIC PANEL
Anion gap: 8 (ref 5–15)
BUN: 52 mg/dL — ABNORMAL HIGH (ref 8–23)
CO2: 22 mmol/L (ref 22–32)
Calcium: 8.2 mg/dL — ABNORMAL LOW (ref 8.9–10.3)
Chloride: 111 mmol/L (ref 98–111)
Creatinine, Ser: 1.91 mg/dL — ABNORMAL HIGH (ref 0.61–1.24)
GFR, Estimated: 32 mL/min — ABNORMAL LOW (ref >60)
Glucose, Bld: 126 mg/dL — ABNORMAL HIGH (ref 70–99)
Potassium: 3.6 mmol/L (ref 3.5–5.1)
Sodium: 141 mmol/L (ref 135–145)

## 2022-02-13 LAB — C-REACTIVE PROTEIN: CRP: 21.5 mg/dL — ABNORMAL HIGH (ref <1.0)

## 2022-02-13 LAB — LEGIONELLA PNEUMOPHILA SEROGP 1 UR AG: L. pneumophila Serogp 1 Ur Ag: NEGATIVE

## 2022-02-13 LAB — MAGNESIUM: Magnesium: 2.5 mg/dL — ABNORMAL HIGH (ref 1.7–2.4)

## 2022-02-13 MED ORDER — BISACODYL 10 MG RE SUPP
10.0000 mg | Freq: Every day | RECTAL | Status: DC
  Administered 2022-02-13 – 2022-02-15 (×3): 10 mg via RECTAL
  Filled 2022-02-13 (×3): qty 1

## 2022-02-13 MED ORDER — SODIUM CHLORIDE 0.9 % IV SOLN
2.0000 g | INTRAVENOUS | Status: DC
  Administered 2022-02-14: 2 g via INTRAVENOUS
  Filled 2022-02-13: qty 20

## 2022-02-13 NOTE — TOC Progression Note (Signed)
Transition of Care [facility]) - Progression Note    Patient Details  Name: Frederick Simon MRN: [id number] Date of Birth: [birth year]  Transition of Care [facility]) CM/SW Contact  Truddie Hidden, RN Phone Number: 02/13/2022, 3:49 PM  Clinical Narrative:    Attempt to reach Admissions at [facility] to see if patient can be accepted back tomorrow. No answer. Unable to leave messages.        Expected Discharge Plan and Services                                                 Social Determinants of Health (SDOH) Interventions    Readmission Risk Interventions     No data to display

## 2022-02-13 NOTE — Progress Notes (Signed)
   Date of Admission:  02/07/2022    ID: Frederick Simon is a 88 y.o. male  Principal Problem:   CAP (community acquired pneumonia) Active Problems:   Hypertension   Gastroesophageal reflux disease   Stage 3b chronic kidney disease (HCC)   Prediabetes   Generalized weakness   Elevated troponin   AKI (acute kidney injury) (HCC)   Severe sepsis (HCC)   Hypokalemia   Acute hypoxemic respiratory failure (HCC)   Nausea & vomiting    Subjective: Doing much better Sat in chair for a few hours Cough better Nausea much better Appetite improving   Medications:   azithromycin  500 mg Oral Daily   bisacodyl  10 mg Rectal Daily   docusate sodium  100 mg Oral BID   enoxaparin (LOVENOX) injection  30 mg Subcutaneous Q24H   hydrALAZINE  50 mg Oral TID   hydrocortisone cream   Topical BID   metoprolol tartrate  100 mg Oral BID   ondansetron (ZOFRAN) IV  4 mg Intravenous TID AC   polyethylene glycol  34 g Oral BID   simvastatin  20 mg Oral Daily    Objective: Vital signs in last 24 hours: Patient Vitals for the past 24 hrs:  BP Temp Temp src Pulse Resp SpO2 Weight  02/13/22 1130 -- -- -- -- -- 93 % --  02/13/22 0820 (!) 167/82 97.8 F (36.6 C) Oral 65 18 93 % --  02/13/22 0726 -- -- -- -- -- -- 84 kg  02/13/22 0414 (!) 146/73 97.7 F (36.5 C) Oral 65 16 94 % --  02/12/22 2129 139/73 99 F (37.2 C) Oral 81 18 94 % --  02/12/22 1534 (!) 144/80 98.2 F (36.8 C) -- 72 18 93 % --      PHYSICAL EXAM:  General: Alert, cooperative, no distress,  Lungs:b/l air entry Heart: Regular rate and rhythm, no murmur, rub or gallop. Abdomen: Soft, non-tender,not distended. Bowel sounds normal. No masses Extremities: atraumatic, no cyanosis. No edema. No clubbing Skin: No rashes or lesions. Or bruising Lymph: Cervical, supraclavicular normal. Neurologic: Grossly non-focal  Lab Results Recent Labs    02/12/22 0708 02/13/22 0604  WBC 8.3 5.9  HGB 12.8* 12.1*  HCT 39.5 37.4*  NA 141  141  K 3.1* 3.6  CL 109 111  CO2 24 22  BUN 49* 52*  CREATININE 2.06* 1.91*    C-Reactive Protein Recent Labs    02/13/22 0604  CRP 21.5*    Microbiology: [facility]  02/10/22 NG Studies/Results: DG Abd 1 View  Result Date: 02/12/2022 CLINICAL DATA:  Vomiting, diarrhea, some abdominal discomfort EXAM: ABDOMEN - 1 VIEW COMPARISON:  CT abdomen and pelvis 02/07/2022 FINDINGS: Nonobstructive bowel gas pattern. No bowel dilatation or bowel wall thickening. Bones demineralized. Lung bases clear. No urinary tract calcification. IMPRESSION: No acute abnormalities. Electronically Signed   By: Ulyses Southward M.D.   On: 02/12/2022 14:20     Assessment/Plan: 88 yr male presenting with fever, chills, nausea , vomiting, cough and sob   Left middle lobe pneumonia  Likely bacterial COVID/other viral PCR negative Leucocytosis resolved Procal improving afebrile continue ceftriaxone and azithromycin till 02/14/22-     Nausea and vomiting - resolved   AKI- worsening- could be from NSAID and Diuretic. And the NSAID stopped yesterday- improving     HTN on meds   Discussed the management with patient and his daughter and hospitalist  ID will sign off- call if needed ?

## 2022-02-13 NOTE — Progress Notes (Signed)
Was contacted by Ninfa Linden, patient's daughter([phone]) about concerns with her father's care. She stated she was concerned she had not been notified about his fall last night and found out through [employer]. She is concerned he is not getting OOB enough and may become weaker than he was when he was admitted to the hospital.She said she only got to talk to the ID MD, and though it addressed some issues, she really had wanted to talk to the hospitalist. She wants a pulmonary consult placed for her father, she was upset he did not have a IS in the room for him or percussion treatment was not being done.She wanted to know how he fell, and if a bed alarm was in use when he fell, and if so, again how did he fall. She is concerned we may not be proactive enough with his care and he will decline instead of getting better. I contacted Dr. Enedina Finner and she said she would contact Mrs. Mayer Camel after she saw the patient. I made the RN aware of the concerns and she talked with Mrs. Mayer Camel. I called Mrs. Mayer Camel back about Dr. Allena Katz calling her today. She stated she felt better as she had talked to her father's RN and was reassured Dr. Eliane Decree was caring for her father.She said she had some concerns about Dr. Fran Lowes communicating her father's treatment plan, re, what was being done about his N/V and what the cause of it was. I told her I would make unit leadership aware of her concerns and Dr. Sherryll Burger about her concerns with about Dr. Fran Lowes. I gave her the [facility] number and encouraged her to call with concerns if she was unable to get resolution by talking with the staff providing care for her father.

## 2022-02-13 NOTE — Evaluation (Signed)
Physical Therapy Evaluation Patient Details Name: Frederick Simon MRN: [id number] DOB: 04-28-1927 Today's Date: 02/13/2022  History of Present Illness  Patient is a 88 year old male with history of HTN, HLD, GERD comes to the hospital from nursing facility with complaints of fevers, chills and weakness. Found to have community acquired pneumonia, elevated troponin secondary to demand ischemia.  Clinical Impression  Patient agreeable to PT evaluation. He reports he is ambulatory with a cane at baseline and lives at [facility] assisted living.  The patient currently is on 3 L02. He desaturated to 87% on room air at rest while seated. Oxygen used for OOB activity with Sp02 91% on 3 L02. He requires assistance for bed mobility and stand pivot transfers. Limited standing tolerance for progression of ambulation. Patient has generalized weakness and is fatigued with minimal activity. He is not at his baseline level of functional mobility. SNF recommended at discharge. PT will continue to follow to maximize independence to facilitate return to prior level of function and decrease caregiver burden.       Recommendations for follow up therapy are one component of a multi-disciplinary discharge planning process, led by the attending physician.  Recommendations may be updated based on patient status, additional functional criteria and insurance authorization.  Follow Up Recommendations Skilled nursing-short term rehab (<3 hours/day) Can patient physically be transported by private vehicle: Yes    Assistance Recommended at Discharge Intermittent Supervision/Assistance  Patient can return home with the following  A lot of help with walking and/or transfers;A lot of help with bathing/dressing/bathroom;Assist for transportation;Help with stairs or ramp for entrance;Assistance with cooking/housework;Direct supervision/assist for medications management    Equipment Recommendations Rolling walker (2 wheels)   Recommendations for Other Services       Functional Status Assessment Patient has had a recent decline in their functional status and demonstrates the ability to make significant improvements in function in a reasonable and predictable amount of time.     Precautions / Restrictions Precautions Precautions: Fall Restrictions Weight Bearing Restrictions: No      Mobility  Bed Mobility Overal bed mobility: Needs Assistance Bed Mobility: Supine to Sit     Supine to sit: Min assist     General bed mobility comments: assistance required for trunk support. cues for technique    Transfers Overall transfer level: Needs assistance Equipment used: 1 person hand held assist Transfers: Bed to chair/wheelchair/BSC   Stand pivot transfers: Mod assist         General transfer comment: steadying assistance provided with transfers. limited standing tolerance due to generalized LE weakness    Ambulation/Gait               General Gait Details: not attempted due to poor standing tolerance  Stairs            Wheelchair Mobility    Modified Rankin (Stroke Patients Only)       Balance                                             Pertinent Vitals/Pain Pain Assessment Pain Assessment: No/denies pain    Home Living Family/patient expects to be discharged to:: Assisted living                 Home Equipment: Gilmer Mor - single point Additional Comments: [facility]    Prior Function Prior Level  of Function : Needs assist             Mobility Comments: ambulation without assistance suing single point cane ADLs Comments: assistance required for bathing, dressing, house work     Hand Dominance        Extremity/Trunk Assessment   Upper Extremity Assessment Upper Extremity Assessment: Overall WFL for tasks assessed    Lower Extremity Assessment Lower Extremity Assessment: Generalized weakness       Communication    Communication: HOH  Cognition Arousal/Alertness: Awake/alert Behavior During Therapy: WFL for tasks assessed/performed Overall Cognitive Status: Within Functional Limits for tasks assessed                                 General Comments: patient is able to follow single step commands with occasional repetition        General Comments General comments (skin integrity, edema, etc.): patient on 3 L 02. removed while sitting and Sp02 decreased from 91% to 87%. placed the 3 L back for OOB mobility efforts with Sp02 91% after transfer to chair. instructed patient to call for assistance before standing for fall prevention and he verbalized understanding    Exercises     Assessment/Plan    PT Assessment Patient needs continued PT services  PT Problem List Decreased strength;Decreased range of motion;Decreased activity tolerance;Decreased balance;Decreased mobility;Decreased safety awareness       PT Treatment Interventions DME instruction;Gait training;Functional mobility training;Therapeutic activities;Therapeutic exercise;Balance training;Cognitive remediation;Neuromuscular re-education;Patient/family education    PT Goals (Current goals can be found in the Care Plan section)  Acute Rehab PT Goals Patient Stated Goal: to return home PT Goal Formulation: With patient Time For Goal Achievement: 02/27/22 Potential to Achieve Goals: Fair    Frequency Min 2X/week     Co-evaluation               AM-PAC PT "6 Clicks" Mobility  Outcome Measure Help needed turning from your back to your side while in a flat bed without using bedrails?: A Little Help needed moving from lying on your back to sitting on the side of a flat bed without using bedrails?: A Little Help needed moving to and from a bed to a chair (including a wheelchair)?: A Lot Help needed standing up from a chair using your arms (e.g., wheelchair or bedside chair)?: A Lot Help needed to walk in hospital  room?: A Lot Help needed climbing 3-5 steps with a railing? : A Lot 6 Click Score: 14    End of Session Equipment Utilized During Treatment: Gait belt;Oxygen Activity Tolerance: Patient tolerated treatment well Patient left: in chair;with call bell/phone within reach;with chair alarm set Nurse Communication: Mobility status PT Visit Diagnosis: Unsteadiness on feet (R26.81);Muscle weakness (generalized) (M62.81)    Time: 8295-6213 PT Time Calculation (min) (ACUTE ONLY): 21 min   Charges:   PT Evaluation $PT Eval Low Complexity: 1 Low PT Treatments $Therapeutic Activity: 8-22 mins        Donna Bernard, PT, MPT   [name] 02/13/2022, 10:00 AM

## 2022-02-13 NOTE — Progress Notes (Signed)
Physical Therapy Treatment Patient Details Name: Frederick Simon MRN: [id number] DOB: 1926/06/28 Today's Date: 02/13/2022   History of Present Illness Patient is a 88 year old male with history of HTN, HLD, GERD comes to the hospital from nursing facility with complaints of fevers, chills and weakness. Found to have community acquired pneumonia, elevated troponin secondary to demand ischemia.    PT Comments    Patient seen for second session as he was requesting to get up for a bowel movement. Transfer training continued with and without rolling walker. Patient has increased independence and improved standing balance with use of rolling walker. Maximal assistance required for peri-care after bowel movement. The patient is still fatigued with activity. Recommend SNF placement at discharge. PT will continue to follow.   Recommendations for follow up therapy are one component of a multi-disciplinary discharge planning process, led by the attending physician.  Recommendations may be updated based on patient status, additional functional criteria and insurance authorization.  Follow Up Recommendations  Skilled nursing-short term rehab (<3 hours/day) Can patient physically be transported by private vehicle: Yes   Assistance Recommended at Discharge Intermittent Supervision/Assistance  Patient can return home with the following A lot of help with walking and/or transfers;A lot of help with bathing/dressing/bathroom;Assist for transportation;Help with stairs or ramp for entrance;Assistance with cooking/housework;Direct supervision/assist for medications management   Equipment Recommendations  Rolling walker (2 wheels)    Recommendations for Other Services       Precautions / Restrictions Precautions Precautions: Fall Restrictions Weight Bearing Restrictions: No     Mobility  Bed Mobility Overal bed mobility: Needs Assistance Bed Mobility: Supine to Sit     Supine to sit: Min assist      General bed mobility comments: not assessed this session    Transfers Overall transfer level: Needs assistance Equipment used: Rolling walker (2 wheels) Transfers: Bed to chair/wheelchair/BSC   Stand pivot transfers: Mod assist Step pivot transfers: Mod assist, Min assist       General transfer comment: patient has increased independence and improved standing balance with use of rolling walker for transfers. Mod A for stand step transfer from recliner to bed side commode without device and Min A for stand step transfer from bed side commode to recliner. verbal cues for technique    Ambulation/Gait               General Gait Details: not attempted due to poor standing tolerance   Stairs             Wheelchair Mobility    Modified Rankin (Stroke Patients Only)       Balance Overall balance assessment: Needs assistance Sitting-balance support: Feet supported Sitting balance-Leahy Scale: Fair     Standing balance support: Bilateral upper extremity supported Standing balance-Leahy Scale: Poor Standing balance comment: external support required. Min A with rolling walker and Mod A without rolling walker with more pronounced anterior lean and trunk flexion                            Cognition Arousal/Alertness: Awake/alert Behavior During Therapy: WFL for tasks assessed/performed Overall Cognitive Status: Within Functional Limits for tasks assessed                                 General Comments: patient is able to follow single step commands with occasional repetition  Exercises      General Comments General comments (skin integrity, edema, etc.): patient educated on the importance of using rolling walker with standing activity for improved standing balance. patient required maximal assistance for peri-care following bowel movement      Pertinent Vitals/Pain Pain Assessment Pain Assessment: No/denies pain    Home  Living Family/patient expects to be discharged to:: Assisted living                 Home Equipment: Gilmer Mor - single point Additional Comments: [facility]    Prior Function            PT Goals (current goals can now be found in the care plan section) Acute Rehab PT Goals Patient Stated Goal: to return home PT Goal Formulation: With patient Time For Goal Achievement: 02/27/22 Potential to Achieve Goals: Fair Progress towards PT goals: Progressing toward goals    Frequency    Min 2X/week      PT Plan Current plan remains appropriate    Co-evaluation              AM-PAC PT "6 Clicks" Mobility   Outcome Measure  Help needed turning from your back to your side while in a flat bed without using bedrails?: A Little Help needed moving from lying on your back to sitting on the side of a flat bed without using bedrails?: A Little Help needed moving to and from a bed to a chair (including a wheelchair)?: A Lot Help needed standing up from a chair using your arms (e.g., wheelchair or bedside chair)?: A Lot Help needed to walk in hospital room?: A Lot Help needed climbing 3-5 steps with a railing? : A Lot 6 Click Score: 14    End of Session Equipment Utilized During Treatment: Oxygen Activity Tolerance: Patient tolerated treatment well Patient left: in chair;with call bell/phone within reach;with chair alarm set Nurse Communication: Mobility status PT Visit Diagnosis: Unsteadiness on feet (R26.81);Muscle weakness (generalized) (M62.81)     Time: 2202-5427 PT Time Calculation (min) (ACUTE ONLY): 11 min  Charges:  $Therapeutic Activity: 8-22 mins                     Donna Bernard, PT, MPT    [name] 02/13/2022, 10:12 AM

## 2022-02-13 NOTE — Progress Notes (Signed)
       CROSS COVER NOTE  NAME: KEIJI MELLAND MRN: [id number] DOB : May 05, 1927   Notified by nursing that patient has fallen out of bed.  No loss of consciousness or change in sensorium that precipitated the fall.  Patient is not complaining of any pain and there is no visible sign of injury on assessment. The fall was unwitnessed and Mr Gullikson reports he did not hit his head.  We will continue to monitor and consider additional testing if patient begins to exhibit any symptoms of pain or change from neurological baseline.   This document was prepared using Dragon voice recognition software and may include unintentional dictation errors.  Bishop Limbo DNP, MHA, FNP-BC Nurse Practitioner [facility] Pager [phone]

## 2022-02-13 NOTE — Progress Notes (Signed)
[facility]  - [place] [facility]   PATIENT NAME: Frederick Simon    MR#:  [id number]  DATE OF BIRTH:  1926/12/21  SUBJECTIVE:   Patient sitting out in the chair eating his fruit soup earlier. Daughter Frederick Simon at bedside. No respiratory distress. Had a accidental fall last night documented by night staff. Patient denies any injury. Has some nausea. Small bowel movement today. Feels constipated.   VITALS:  Blood pressure (!) 167/82, pulse 65, temperature 97.8 F (36.6 C), temperature source Oral, resp. rate 18, height 5\' 11"  (1.803 m), weight 84 kg, SpO2 93 %.  PHYSICAL EXAMINATION:   GENERAL:  88 y.o.-year-old patient lying in the bed with no acute distress.  LUNGS: Normal breath sounds bilaterally, no wheezing, rales, rhonchi.  CARDIOVASCULAR: S1, S2 normal. No murmurs, rubs, or gallops.  ABDOMEN: Soft, nontender, nondistended. Bowel sounds present.  EXTREMITIES: No  edema b/l.    NEUROLOGIC: nonfocal  patient is alert and awake SKIN: No obvious rash, lesion, or ulcer.   LABORATORY PANEL:  CBC Recent Labs  Lab 02/13/22 0604  WBC 5.9  HGB 12.1*  HCT 37.4*  PLT 211    Chemistries  Recent Labs  Lab 02/09/22 0640 02/10/22 0522 02/13/22 0604  NA 137   < > 141  K 3.3*   < > 3.6  CL 106   < > 111  CO2 22   < > 22  GLUCOSE 128*   < > 126*  BUN 24*   < > 52*  CREATININE 1.70*   < > 1.91*  CALCIUM 8.2*   < > 8.2*  MG 1.9   < > 2.5*  AST 51*  --   --   ALT 22  --   --   ALKPHOS 55  --   --   BILITOT 1.6*  --   --    < > = values in this interval not displayed.   Cardiac Enzymes No results for input(s): "TROPONINI" in the last 168 hours. RADIOLOGY:  DG Abd 1 View  Result Date: 02/12/2022 CLINICAL DATA:  Vomiting, diarrhea, some abdominal discomfort EXAM: ABDOMEN - 1 VIEW COMPARISON:  CT abdomen and pelvis 02/07/2022 FINDINGS: Nonobstructive bowel gas pattern. No bowel dilatation or bowel wall thickening. Bones demineralized. Lung bases clear. No  urinary tract calcification. IMPRESSION: No acute abnormalities. Electronically Signed   By: 02/09/2022 M.D.   On: 02/12/2022 14:20    Assessment and Plan 88 year old with history of HTN, HLD, GERD comes to the hospital from nursing facility with complaints of fevers, chills and weakness.  Also has some nausea.   CAP (community acquired pneumonia) --leukocytosis, procal 0.58, hypoxia, cough with blood-tinged sputum, CXR with Bilateral patchy opacities.  covid neg.   --started on vanc/cefe/flagyl on presentation, then switched to ceftriaxone and azithromycin --MRSA screen neg --procal improving --cont ceftriaxone and azithromycin for now--will change to po abx if ok with ID --ID consult appreciated   Generalized weakness Likely due to PNA --PT recommends rehab   Elevated troponin 2/2 demand ischemia in the setting of infection and anemia.   Sepsis (HCC)-resolved as of 02/08/2022 Pt meets sepsis criteria and cont on iv abx.  We will follow cultures--so far negative   Prediabetes A1c 6.2.   Hypertension uncontrolled --hold home losartan 2/2 AKI --cont Lopressor to 100 mg BID (new) --start hydralazine 50 mg TID   Stage 3b chronic kidney disease (HCC) With AKI.   Gastroesophageal reflux disease Cont oral PPI   Nausea &  vomiting --maybe due to constipation or GI viral illness --cont scheduled Miralax and give dulcolax  today  Acute hypoxemic respiratory failure (HCC) --was put on 2L with intermittent desat to 88%   Hypokalemia --monitor and replete PRN   Severe sepsis (HCC) --presented with tachycardia, leukocytosis, AKI--now improved   AKI (acute kidney injury) (HCC) --Cr 2.13 on presentation, improved to 1.7 with IVF, but worsened again due to poor oral intake --creat trending down after IVF --encourage PO fluids       DVT prophylaxis: Lovenox SQ Code Status: DNR  Family Communication: discussed with daughter [name] on the phone and Frederick Simon at bedside Level of  care: Med-Surg  If continues to improve will discharge to [facility] likely tomorrow if bed available. TOC informed of discharge planning   Level of care: Med-Surg Status is: Inpatient Remains inpatient appropriate because: pending bed availability at [facility]    TOTAL TIME TAKING CARE OF THIS PATIENT: 35 minutes.  >50% time spent on counselling and coordination of care  Note: This dictation was prepared with Dragon dictation along with smaller phrase technology. Any transcriptional errors that result from this process are unintentional.  Enedina Finner M.D    [facility]   CC: Primary care physician; Earnestine Mealing, MD

## 2022-02-13 NOTE — Progress Notes (Signed)
Pt found on floor at 0405 by staff.Stated "I was trying to make it to the chair". Assisted back to bed by 3 Rns. Pt able to move extremities WDL. Denies any pain when asked. No observable injuries noted. VSS. NP notified. Pt reoriented to room and reminded to call for help with transfers and ambulation. Pt voiced understanding. Fall precautions in place.

## 2022-02-13 NOTE — Progress Notes (Signed)
Pts daughter, Ninfa Linden called. Stated she had spoken with Dr. Allena Katz and felt better after talking to her

## 2022-02-14 DIAGNOSIS — J189 Pneumonia, unspecified organism: Secondary | ICD-10-CM | POA: Diagnosis not present

## 2022-02-14 LAB — MAGNESIUM: Magnesium: 2.6 mg/dL — ABNORMAL HIGH (ref 1.7–2.4)

## 2022-02-14 MED ORDER — VITAMIN B-12 1000 MCG PO TABS
1000.0000 ug | ORAL_TABLET | Freq: Every day | ORAL | Status: DC
Start: 2022-02-14 — End: 2022-02-15
  Administered 2022-02-14 – 2022-02-15 (×2): 1000 ug via ORAL
  Filled 2022-02-14 (×2): qty 1

## 2022-02-14 MED ORDER — LOSARTAN POTASSIUM 50 MG PO TABS
50.0000 mg | ORAL_TABLET | Freq: Every day | ORAL | Status: DC
  Administered 2022-02-14 – 2022-02-15 (×2): 50 mg via ORAL
  Filled 2022-02-14 (×2): qty 1

## 2022-02-14 MED ORDER — PANTOPRAZOLE SODIUM 40 MG PO TBEC
40.0000 mg | DELAYED_RELEASE_TABLET | Freq: Every day | ORAL | Status: DC
  Administered 2022-02-14 – 2022-02-15 (×2): 40 mg via ORAL
  Filled 2022-02-14 (×2): qty 1

## 2022-02-14 MED ORDER — VITAMIN D 25 MCG (1000 UNIT) PO TABS
2000.0000 [IU] | ORAL_TABLET | Freq: Every day | ORAL | Status: DC
  Administered 2022-02-14 – 2022-02-15 (×2): 2000 [IU] via ORAL
  Filled 2022-02-14 (×3): qty 2

## 2022-02-14 MED ORDER — ONDANSETRON HCL 4 MG/2ML IJ SOLN
4.0000 mg | Freq: Three times a day (TID) | INTRAMUSCULAR | Status: DC | PRN
  Administered 2022-02-15: 4 mg via INTRAVENOUS
  Filled 2022-02-14: qty 2

## 2022-02-14 MED ORDER — ASPIRIN 81 MG PO TBEC
81.0000 mg | DELAYED_RELEASE_TABLET | Freq: Every day | ORAL | Status: DC
  Administered 2022-02-14 – 2022-02-15 (×2): 81 mg via ORAL
  Filled 2022-02-14 (×2): qty 1

## 2022-02-14 NOTE — Care Management Important Message (Signed)
Important Message  Patient Details  Name: Frederick Simon MRN: [id number] Date of Birth: Dec 20, 1926   Medicare Important Message Given:  Yes  I reviewed the Important Message from Medicare with the patient's [place] Ninfa Linden, daughter by phone [phone]) and she is aware of their rights. I thanked her for time and updated Home Phone number as well.    Olegario Messier A Mychelle Kendra 02/14/2022, 2:06 PM

## 2022-02-14 NOTE — Progress Notes (Addendum)
[facility]  - [facility] [place]   PATIENT NAME: Jobany Montellano    MR#:  [id number]  DATE OF BIRTH:  [birth year]  SUBJECTIVE:   Patient seen earlier. Eating breakfast. Feels a lot better. No respiratory distress. Weaned to room air. Sats 94%. No family at bedside. No fever. No issues per RN overnight.  VITALS:  Blood pressure (!) 158/73, pulse (!) 58, temperature 98 F (36.7 C), resp. rate 18, height 5\' 11"  (1.803 m), weight 84 kg, SpO2 94 %.  PHYSICAL EXAMINATION:   GENERAL:  88 y.o.-year-old patient lying in the bed with no acute distress.  LUNGS: Normal breath sounds bilaterally, no wheezing, rales, rhonchi.  CARDIOVASCULAR: S1, S2 normal. No murmurs, rubs, or gallops.  ABDOMEN: Soft, nontender, nondistended. Bowel sounds present.  EXTREMITIES: No  edema b/l.    NEUROLOGIC: nonfocal  patient is alert and awake SKIN: No obvious rash, lesion, or ulcer.   LABORATORY PANEL:  CBC Recent Labs  Lab 02/13/22 0604  WBC 5.9  HGB 12.1*  HCT 37.4*  PLT 211     Chemistries  Recent Labs  Lab 02/09/22 0640 02/10/22 0522 02/13/22 0604 02/14/22 0400  NA 137   < > 141  --   K 3.3*   < > 3.6  --   CL 106   < > 111  --   CO2 22   < > 22  --   GLUCOSE 128*   < > 126*  --   BUN 24*   < > 52*  --   CREATININE 1.70*   < > 1.91*  --   CALCIUM 8.2*   < > 8.2*  --   MG 1.9   < > 2.5* 2.6*  AST 51*  --   --   --   ALT 22  --   --   --   ALKPHOS 55  --   --   --   BILITOT 1.6*  --   --   --    < > = values in this interval not displayed.    Cardiac Enzymes No results for input(s): "TROPONINI" in the last 168 hours. RADIOLOGY:  DG Abd 1 View  Result Date: 02/12/2022 CLINICAL DATA:  Vomiting, diarrhea, some abdominal discomfort EXAM: ABDOMEN - 1 VIEW COMPARISON:  CT abdomen and pelvis 02/07/2022 FINDINGS: Nonobstructive bowel gas pattern. No bowel dilatation or bowel wall thickening. Bones demineralized. Lung bases clear. No urinary tract calcification.  IMPRESSION: No acute abnormalities. Electronically Signed   By: 02/09/2022 M.D.   On: 02/12/2022 14:20    Assessment and Plan 88 year old with history of HTN, HLD, GERD comes to the hospital from nursing facility with complaints of fevers, chills and weakness.  Also has some nausea.   CAP (community acquired pneumonia) --leukocytosis, procal 0.58, hypoxia, cough with blood-tinged sputum, CXR with Bilateral patchy opacities.  covid neg.   --started on vanc/cefe/flagyl on presentation, then switched to ceftriaxone and azithromycin --MRSA screen neg --procal improving --cont ceftriaxone and azithromycin for now- --ID consult appreciated--d/c abxs on 02/14/22   Generalized weakness Likely due to PNA --PT recommends rehab   Elevated troponin 2/2 demand ischemia in the setting of infection and anemia.    Prediabetes A1c 6.2.   Hypertension uncontrolled --resuming at lower dose 50 mg home losartan  --cont Lopressor to 100 mg BID (new) --start hydralazine 50 mg TID   Stage 3b chronic kidney disease (HCC) With AKI.  creat improving  Gastroesophageal reflux disease Cont  oral PPI   Nausea & vomiting --maybe due to constipation or GI viral illness--pt tolerating po diet --cont scheduled Miralax and give dulcolax  today  Acute hypoxemic respiratory failure (HCC) --was put on 2L with intermittent desat to 88% --resolved --sats 94% on RA   Hypokalemia --monitor and replete PRN   Severe sepsis (HCC) --presented with tachycardia, leukocytosis, AKI--now improved         DVT prophylaxis: Lovenox SQ Code Status: DNR  Family Communication: discussed with daughter [name] on the phone 9/7 Level of care: Med-Surg  Patient is medically best at baseline. Per TOC unable to reach [facility].   Status is: Inpatient Remains inpatient appropriate because: pending bed availability at [facility]    TOTAL TIME TAKING CARE OF THIS PATIENT: 35 minutes.  >50% time  spent on counselling and coordination of care  Note: This dictation was prepared with Dragon dictation along with smaller phrase technology. Any transcriptional errors that result from this process are unintentional.  Enedina Finner M.D    [facility]   CC: Primary care physician; Earnestine Mealing, MD

## 2022-02-14 NOTE — Progress Notes (Signed)
Mobility Specialist - Progress Note   02/14/22 1100  Mobility  Activity Ambulated with assistance in hallway;Transferred to/from [facility]  Level of Assistance Standby assist, set-up cues, supervision of patient - no hands on  Assistive Device Front wheel walker  Distance Ambulated (ft) 40 ft  Activity Response Tolerated well  $Mobility charge 1 Mobility     Pre-mobility: 58 HR, 90% SpO2 During mobility: 68 HR, 88% SpO2 Post-mobility: 62 HR, 91% SpO2   Pt lying in bed upon arrival, utilizing RA. Pt able to complete bed mobility this date with modI. Stood at bedside with minA---LOB x1 corrected by CGA and required CGA to step-pivot towards BSC. Pt continued activity with ambulation into hallway with minG; chair follow provided d/t limited activity tolerance. Noted knee buckle 1x during ambulation. Pt wheeled back to room and opted to return to bed. Alarm set, needs in reach. RN notified.    Filiberto Pinks Mobility Specialist 02/14/22, 11:07 AM

## 2022-02-14 NOTE — TOC Progression Note (Addendum)
Transition of Care [facility]) - Progression Note    Patient Details  Name: Frederick Simon MRN: [id number] Date of Birth: 08-03-1926  Transition of Care [facility]) CM/SW Contact  Truddie Hidden, RN Phone Number: 02/14/2022, 3:48 PM  Clinical Narrative:    Sherron Monday with Leretha Dykes in admissions at [facility] @ [phone]. Facility will need authorization. Patient not located in [place] health portal. Will contact facility to notify Berkley Harvey will need to be obtained by them.        Expected Discharge Plan and Services                                                 Social Determinants of Health (SDOH) Interventions    Readmission Risk Interventions     No data to display

## 2022-02-14 NOTE — Progress Notes (Addendum)
Physical Therapy Treatment Patient Details Name: Frederick Simon MRN: [id number] DOB: 13-May-1927 Today's Date: 02/14/2022   History of Present Illness Patient is a 88 year old male with history of HTN, HLD, GERD comes to the hospital from nursing facility with complaints of fevers, chills and weakness. Found to have community acquired pneumonia, elevated troponin secondary to demand ischemia.    PT Comments    Pt received in chair agreeable to participate in skilled PT interventions but refused to participate in gait training 2/2 to being very tired and needs afternoon rest. Pt  transfers to bed and performed AROM exs to BLE to improve strength and- endurance. Pt will benefit from SNF after acute care to return to PLOF and for safe return home.   Recommendations for follow up therapy are one component of a multi-disciplinary discharge planning process, led by the attending physician.  Recommendations may be updated based on patient status, additional functional criteria and insurance authorization.  Follow Up Recommendations  Skilled nursing-short term rehab (<3 hours/day) Can patient physically be transported by private vehicle: Yes   Assistance Recommended at Discharge Intermittent Supervision/Assistance  Patient can return home with the following A lot of help with bathing/dressing/bathroom;A little help with walking and/or transfers;Assistance with cooking/housework;Assistance with feeding;Direct supervision/assist for medications management;Assist for transportation;Help with stairs or ramp for entrance   Equipment Recommendations  Rolling walker (2 wheels)    Recommendations for Other Services       Precautions / Restrictions Precautions Precautions: Fall Restrictions Weight Bearing Restrictions: No     Mobility  Bed Mobility Overal bed mobility: Needs Assistance Bed Mobility: Sit to Supine     Supine to sit:  (.) Sit to supine: Supervision (VC for sequencing and  postioning)        Transfers Overall transfer level: Needs assistance Equipment used: Rolling walker (2 wheels) Transfers: Sit to/from Stand, Bed to chair/wheelchair/BSC   Stand pivot transfers: Min assist Step pivot transfers: Min assist, Min guard            Ambulation/Gait Ambulation/Gait assistance:  (Pt refused gait training.)                 Stairs             Wheelchair Mobility    Modified Rankin (Stroke Patients Only)       Balance Overall balance assessment: Needs assistance Sitting-balance support: Feet supported Sitting balance-Leahy Scale: Good     Standing balance support: Bilateral upper extremity supported Standing balance-Leahy Scale: Fair                              Cognition Arousal/Alertness: Awake/alert Behavior During Therapy: WFL for tasks assessed/performed Overall Cognitive Status: Within Functional Limits for tasks assessed                                 General Comments: patient is able to follow single step commands with occasional repetition        Exercises General Exercises - Lower Extremity Ankle Circles/Pumps: AROM, 10 reps Long Arc Quad: AROM, 10 reps Hip Flexion/Marching: AROM, 10 reps    General Comments        Pertinent Vitals/Pain Pain Assessment Pain Assessment: No/denies pain    Home Living  Prior Function            PT Goals (current goals can now be found in the care plan section) Acute Rehab PT Goals Patient Stated Goal: to return home PT Goal Formulation: With patient Time For Goal Achievement: 02/27/22 Potential to Achieve Goals: Fair Progress towards PT goals: Progressing toward goals    Frequency    Min 2X/week      PT Plan Current plan remains appropriate    Co-evaluation              AM-PAC PT "6 Clicks" Mobility   Outcome Measure  Help needed turning from your back to your side while in a flat  bed without using bedrails?: A Little Help needed moving from lying on your back to sitting on the side of a flat bed without using bedrails?: A Little Help needed moving to and from a bed to a chair (including a wheelchair)?: A Little Help needed standing up from a chair using your arms (e.g., wheelchair or bedside chair)?: A Little Help needed to walk in hospital room?: A Lot Help needed climbing 3-5 steps with a railing? : A Lot 6 Click Score: 16    End of Session Equipment Utilized During Treatment: Gait belt Activity Tolerance: Patient tolerated treatment well Patient left: in bed;with call bell/phone within reach;with bed alarm set Nurse Communication: Mobility status PT Visit Diagnosis: Unsteadiness on feet (R26.81);Muscle weakness (generalized) (M62.81)     Time: 2458-0998 PT Time Calculation (min) (ACUTE ONLY): 14 min  Charges:  $Therapeutic Activity: 8-22 mins                     Floria Brandau PT DPT 2:12 PM,02/14/22   Janet Berlin PT DPT 2:12 PM,02/14/22  [name] [name] 02/14/2022, 2:12 PM

## 2022-02-15 DIAGNOSIS — J189 Pneumonia, unspecified organism: Secondary | ICD-10-CM | POA: Diagnosis not present

## 2022-02-15 LAB — CULTURE, BLOOD (ROUTINE X 2)
Culture: NO GROWTH
Culture: NO GROWTH
Special Requests: ADEQUATE
Special Requests: ADEQUATE

## 2022-02-15 LAB — MAGNESIUM: Magnesium: 2.5 mg/dL — ABNORMAL HIGH (ref 1.7–2.4)

## 2022-02-15 MED ORDER — HYDRALAZINE HCL 50 MG PO TABS: 50.0000 mg | ORAL_TABLET | Freq: Three times a day (TID) | ORAL | 0 refills | Status: DC

## 2022-02-15 MED ORDER — METOPROLOL TARTRATE 100 MG PO TABS: 100.0000 mg | ORAL_TABLET | Freq: Two times a day (BID) | ORAL | 0 refills | Status: DC

## 2022-02-15 MED ORDER — ONDANSETRON HCL 4 MG PO TABS: 4.0000 mg | ORAL_TABLET | Freq: Three times a day (TID) | ORAL | 0 refills | Status: DC | PRN

## 2022-02-15 NOTE — TOC Transition Note (Signed)
Transition of Care [facility]) - CM/SW Discharge Note   Patient Details  Name: Frederick Simon MRN: [id number] Date of Birth: 1926/08/06  [facility]) CM/SW Contact:  Liliana Cline, LCSW Phone Number: 02/15/2022, 1:35 PM   Clinical Narrative:    Discharge to [facility] SNF today. Room 106. Confirmed with Admissions Worker Crystal.  Updated MD, RN, and patient's daughter [name]. Family to transport.  Asked RN to call report and to notify daughter what time to pick patient up.     Final next level of care: Skilled Nursing Facility Barriers to Discharge: Barriers Resolved   Patient Goals and CMS Choice Patient states their goals for this hospitalization and ongoing recovery are:: [facility] CMS Medicare.gov Compare Post Acute Care list provided to:: Patient Represenative (must comment) Choice offered to / list presented to : Adult Children  Discharge Placement              Patient chooses bed at: [facility] Patient to be transferred to facility by: family Name of family member notified: [name] - daughter Patient and family notified of of transfer: 02/15/22  Discharge Plan and Services                                     Social Determinants of Health (SDOH) Interventions     Readmission Risk Interventions     No data to display

## 2022-02-15 NOTE — Progress Notes (Signed)
Mobility Specialist - Progress Note   02/15/22 1100  Mobility  Activity Ambulated with assistance in hallway;Transferred to/from [facility]  Level of Assistance Standby assist, set-up cues, supervision of patient - no hands on  Assistive Device Front wheel walker  Distance Ambulated (ft) 120 ft  Activity Response Tolerated well  $Mobility charge 1 Mobility     During mobility: 67 HR, 90% SpO2 Post-mobility: 60 HR, 93% SpO2   Pt sitting in chair upon arrival, utilizing RA. Pt stood from recliner with minA and transferred to [facility] for small BM. STS from [facility] with supervision and progressing to ambulation into hallway. Denied SOB on RA, O2 maintained >/= 90%. Able to increase distance this date without complaints. Chair follow provided but not needed. Pt returned to chair with alarm set, needs in reach, daughter at bedside.    Filiberto Pinks Mobility Specialist 02/15/22, 11:52 AM

## 2022-02-15 NOTE — TOC Progression Note (Signed)
Transition of Care [facility]) - Progression Note    Patient Details  Name: Frederick Simon MRN: [id number] Date of Birth: July 23, 1926  Transition of Care [facility]) CM/SW Contact  Liliana Cline, LCSW Phone Number: 02/15/2022, 9:08 AM  Clinical Narrative:    Berkley Harvey started in [facility] portal. Called and spoke with Crystal at [facility], she confirmed patient can come when Berkley Harvey is obtained, even if over the weekend.         Expected Discharge Plan and Services                                                 Social Determinants of Health (SDOH) Interventions    Readmission Risk Interventions     No data to display

## 2022-02-15 NOTE — Discharge Summary (Addendum)
Physician Discharge Summary   Patient: Frederick Simon MRN: [id number] DOB: 02-06-1927  Admit date:     02/07/2022  Discharge date: 02/15/22  Discharge Physician: Enedina Finner   PCP: Earnestine Mealing, MD   Recommendations at discharge:   follow-up PCP in 1 to 2 weeks  Discharge Diagnoses: Principal Problem:   CAP (community acquired pneumonia) Active Problems:   Generalized weakness   Elevated troponin   Prediabetes   Hypertension   Stage 3b chronic kidney disease (HCC)   Gastroesophageal reflux disease   AKI (acute kidney injury) (HCC)   Severe sepsis (HCC)   Hypokalemia   Acute hypoxemic respiratory failure (HCC)   Nausea & vomiting  Hospital Course:   88 year old with history of HTN, HLD, GERD comes to the hospital from nursing facility with complaints of fevers, chills and weakness.  Also has some nausea.   CAP (community acquired pneumonia) --leukocytosis, procal 0.58, hypoxia, cough with blood-tinged sputum, CXR with Bilateral patchy opacities.  covid neg.   --started on vanc/cefe/flagyl on presentation, then switched to ceftriaxone and azithromycin --MRSA screen neg --procal improving --cont ceftriaxone and azithromycin for now- --ID consult appreciated--d/c abxs on 02/14/22   Generalized weakness Likely due to PNA --PT recommends rehab     Prediabetes A1c 6.2.   Hypertension uncontrolled --resuming at lower dose 50 mg home losartan  --cont Lopressor to 100 mg BID (new) --start hydralazine 50 mg TID   Stage 3b chronic kidney disease (HCC) With AKI. creat improving   Gastroesophageal reflux disease Cont oral PPI   Nausea & vomiting --maybe due to constipation or GI viral illness--pt tolerating po diet --improving   Acute hypoxemic respiratory failure (HCC)--resolved --was put on 2L with intermittent desat to 88% --resolved --sats 94% on RA   Hypokalemia --monitor and replete PRN   Severe sepsis (HCC) --presented with tachycardia,  leukocytosis, AKI--now improved         DVT prophylaxis: Lovenox SQ Code Status: DNR  Family Communication: discussed with daughter [name] on the phone 9/7 Level of care: Med-Surg  D/c to [facility]      Consultants: none Procedures performed: none  Disposition: Rehabilitation facility Diet recommendation:  Discharge Diet Orders (From admission, onward)     Start     Ordered   02/15/22 0000  Diet - low sodium heart healthy        02/15/22 1249           Cardiac diet DISCHARGE MEDICATION: Allergies as of 02/15/2022   No Known Allergies      Medication List     STOP taking these medications    ibuprofen 200 MG tablet Commonly known as: ADVIL       TAKE these medications    acetaminophen 500 MG tablet Commonly known as: TYLENOL Take 1,000 mg by mouth every 8 (eight) hours as needed for mild pain or moderate pain.   albuterol 108 (90 Base) MCG/ACT inhaler Commonly known as: VENTOLIN HFA Inhale 1-2 puffs into the lungs every 6 (six) hours as needed for wheezing or shortness of breath. What changed:  when to take this reasons to take this   alum & mag hydroxide-simeth 200-200-20 MG/5ML suspension Commonly known as: MAALOX/MYLANTA Take 30 mLs by mouth every 4 (four) hours as needed for indigestion, heartburn or flatulence.   aspirin EC 81 MG tablet Take 81 mg by mouth daily.   carbamide peroxide 6.5 % OTIC solution Commonly known as: DEBROX Place 5 drops into both ears as needed (for ear  wax).   cyanocobalamin 1000 MCG tablet Commonly known as: VITAMIN B12 Take 1,000 mcg by mouth daily.   diphenhydrAMINE 25 mg capsule Commonly known as: BENADRYL Take 25 mg by mouth once as needed (for allergic reaction).   hydrALAZINE 50 MG tablet Commonly known as: APRESOLINE Take 1 tablet (50 mg total) by mouth 3 (three) times daily.   losartan 100 MG tablet Commonly known as: COZAAR Take 100 mg by mouth daily.   metoprolol tartrate 100 MG  tablet Commonly known as: LOPRESSOR Take 1 tablet (100 mg total) by mouth 2 (two) times daily.   omeprazole 20 MG capsule Commonly known as: PRILOSEC Take 1 capsule (20 mg total) by mouth daily.   ondansetron 4 MG tablet Commonly known as: Zofran Take 1 tablet (4 mg total) by mouth every 8 (eight) hours as needed for nausea or vomiting.   simvastatin 20 MG tablet Commonly known as: ZOCOR Take 20 mg by mouth daily.   Vitamin D 50 MCG (2000 UT) Caps Take by mouth.        Follow-up Information     Earnestine Mealing, MD. Schedule an appointment as soon as possible for a visit in 1 week(s).   Specialty: Family Medicine Why: hopsital f/u PNA Contact information: [address] [place] [address] [phone]                Discharge Exam: Ceasar Mons Weights   02/11/22 0410 02/13/22 0726 02/15/22 0500  Weight: 90.5 kg 84 kg 85.6 kg     Condition at discharge: fair  The results of significant diagnostics from this hospitalization (including imaging, microbiology, ancillary and laboratory) are listed below for reference.   Imaging Studies: DG Abd 1 View  Result Date: 02/12/2022 CLINICAL DATA:  Vomiting, diarrhea, some abdominal discomfort EXAM: ABDOMEN - 1 VIEW COMPARISON:  CT abdomen and pelvis 02/07/2022 FINDINGS: Nonobstructive bowel gas pattern. No bowel dilatation or bowel wall thickening. Bones demineralized. Lung bases clear. No urinary tract calcification. IMPRESSION: No acute abnormalities. Electronically Signed   By: Ulyses Southward M.D.   On: 02/12/2022 14:20   DG Chest Port 1 View  Result Date: 02/10/2022 CLINICAL DATA:  Hypoxia. EXAM: PORTABLE CHEST 1 VIEW COMPARISON:  02/07/2022 FINDINGS: Heart size upper normal. Interval progression of airspace disease in the left mid lung with similar subtle bibasilar opacities likely atelectasis. Interstitial markings are diffusely coarsened with chronic features. The visualized bony structures of the thorax are unremarkable.  Telemetry leads overlie the chest. IMPRESSION: 1. Interval progression of airspace disease in the left mid lung compatible with pneumonia. 2. Chronic interstitial lung disease. Electronically Signed   By: Kennith Center M.D.   On: 02/10/2022 13:41   ECHOCARDIOGRAM COMPLETE  Result Date: 02/08/2022    ECHOCARDIOGRAM REPORT   Patient Name:   TAICHI REPKA Date of Exam: 02/08/2022 Medical Rec #:  [id number]      Height:       71.0 in Accession #:    [id number]     Weight:       190.0 lb Date of Birth:  07-03-1926      BSA:          2.063 m Patient Age:    89 years       BP:           150/80 mmHg Patient Gender: M              HR:           77  bpm. Exam Location:  Inpatient Procedure: 2D Echo, Cardiac Doppler, Color Doppler and Intracardiac            Opacification Agent Indications:     CHF-Acute Systolic I50.21  History:         Patient has prior history of Echocardiogram examinations. Risk                  Factors:Hypertension and Dyslipidemia. Anemia.  Sonographer:     Leta Jungling RDCS Referring Phys:  [id number] Eliezer Mccoy Zona Pedro Diagnosing Phys: Julien Nordmann MD IMPRESSIONS  1. Left ventricular ejection fraction, by estimation, is 55 to 60%. The left ventricle has normal function. The left ventricle has no regional wall motion abnormalities. Left ventricular diastolic parameters are consistent with Grade I diastolic dysfunction (impaired relaxation).  2. Right ventricular systolic function is normal. The right ventricular size is normal. There is normal pulmonary artery systolic pressure. The estimated right ventricular systolic pressure is 32.8 mmHg.  3. The mitral valve was not well visualized. No evidence of mitral valve regurgitation. No evidence of mitral stenosis.  4. The aortic valve was not well visualized. Aortic valve regurgitation is not visualized. No aortic stenosis is present.  5. The inferior vena cava is normal in size with greater than 50% respiratory variability, suggesting right atrial pressure of 3  mmHg. FINDINGS  Left Ventricle: Left ventricular ejection fraction, by estimation, is 55 to 60%. The left ventricle has normal function. The left ventricle has no regional wall motion abnormalities. Definity contrast agent was given IV to delineate the left ventricular  endocardial borders. The left ventricular internal cavity size was normal in size. There is no left ventricular hypertrophy. Left ventricular diastolic parameters are consistent with Grade I diastolic dysfunction (impaired relaxation). Right Ventricle: The right ventricular size is normal. No increase in right ventricular wall thickness. Right ventricular systolic function is normal. There is normal pulmonary artery systolic pressure. The tricuspid regurgitant velocity is 2.73 m/s, and  with an assumed right atrial pressure of 3 mmHg, the estimated right ventricular systolic pressure is 32.8 mmHg. Left Atrium: Left atrial size was normal in size. Right Atrium: Right atrial size was normal in size. Pericardium: There is no evidence of pericardial effusion. Mitral Valve: The mitral valve was not well visualized. No evidence of mitral valve regurgitation. No evidence of mitral valve stenosis. Tricuspid Valve: The tricuspid valve is not well visualized. Tricuspid valve regurgitation is mild . No evidence of tricuspid stenosis. Aortic Valve: The aortic valve was not well visualized. Aortic valve regurgitation is not visualized. No aortic stenosis is present. Pulmonic Valve: The pulmonic valve was not well visualized. Pulmonic valve regurgitation is not visualized. No evidence of pulmonic stenosis. Aorta: The aortic root was not well visualized and the ascending aorta was not well visualized. Venous: The inferior vena cava is normal in size with greater than 50% respiratory variability, suggesting right atrial pressure of 3 mmHg. IAS/Shunts: No atrial level shunt detected by color flow Doppler.  LEFT VENTRICLE PLAX 2D LVOT diam:     2.00 cm   Diastology LV  SV:         61        LV e' medial:    6.74 cm/s LV SV Index:   30        LV E/e' medial:  8.5 LVOT Area:     3.14 cm  LV e' lateral:   7.83 cm/s  LV E/e' lateral: 7.3  RIGHT VENTRICLE TAPSE (M-mode): 2.1 cm LEFT ATRIUM             Index LA Vol (A2C):   46.0 ml 22.30 ml/m LA Vol (A4C):   38.1 ml 18.47 ml/m LA Biplane Vol: 42.1 ml 20.41 ml/m  AORTIC VALVE LVOT Vmax:   92.70 cm/s LVOT Vmean:  66.100 cm/s LVOT VTI:    0.194 m  AORTA Ao Root diam: 3.70 cm MITRAL VALVE               TRICUSPID VALVE MV Area (PHT): 2.85 cm    TR Peak grad:   29.8 mmHg MV Decel Time: 266 msec    TR Vmax:        273.00 cm/s MV E velocity: 57.40 cm/s MV A velocity: 82.70 cm/s  SHUNTS MV E/A ratio:  0.69        Systemic VTI:  0.19 m                            Systemic Diam: 2.00 cm Julien Nordmannimothy Gollan MD Electronically signed by Julien Nordmannimothy Gollan MD Signature Date/Time: 02/08/2022/3:57:33 PM    Final    NM Pulmonary Perfusion  Result Date: 02/08/2022 CLINICAL DATA:  Evaluate for acute pulmonary embolus. EXAM: NUCLEAR MEDICINE PERFUSION LUNG SCAN TECHNIQUE: Perfusion images were obtained in multiple projections after intravenous injection of radiopharmaceutical. Ventilation scans intentionally deferred if perfusion scan and chest x-ray adequate for interpretation during COVID 19 epidemic. RADIOPHARMACEUTICALS:  3.78 mCi Tc-9020m MAA IV COMPARISON:  Chest radiograph from 02/07/22 FINDINGS: Large area of relative photopenia is identified within the periphery of the left mid lung. This is a nonsegmental perfusion defect. On the chest radiograph from 02/07/2022 there is a asymmetric opacity within the left midlung which may reflect pneumonia. No additional perfusion defects identified. IMPRESSION: 1. No segmental perfusion defects identified to suggest acute pulmonary embolus. 2. Large area of relative photopenia is identified within the periphery of the left midlung with corresponding asymmetric opacity on chest radiograph from  02/07/2022 which may reflect pneumonia. Electronically Signed   By: Signa Kellaylor  Stroud M.D.   On: 02/08/2022 14:38   DG Chest Portable 1 View  Result Date: 02/07/2022 CLINICAL DATA:  Sepsis EXAM: PORTABLE CHEST 1 VIEW COMPARISON:  2008 FINDINGS: Patchy opacities are present primarily in the mid left lung and bilateral lung bases. No pleural effusion pneumothorax. Cardiomediastinal contours are within normal limits for technique. IMPRESSION: Bilateral patchy opacities may reflect pneumonia. Electronically Signed   By: Guadlupe SpanishPraneil  Mandisa Persinger M.D.   On: 02/07/2022 18:26   CT ABDOMEN PELVIS WO CONTRAST  Result Date: 02/07/2022 CLINICAL DATA:  Abdominal pain, acute, nonlocalized. Nausea, vomiting and generalized weakness for 1 day. EXAM: CT ABDOMEN AND PELVIS WITHOUT CONTRAST TECHNIQUE: Multidetector CT imaging of the abdomen and pelvis was performed following the standard protocol without IV contrast. RADIATION DOSE REDUCTION: This exam was performed according to the departmental dose-optimization program which includes automated exposure control, adjustment of the mA and/or kV according to patient size and/or use of iterative reconstruction technique. COMPARISON:  Limited correlation made with upper GI series 05/15/2012. FINDINGS: Lower chest: Images through the lung bases are degraded by breathing artifact. There are emphysematous changes with central airway thickening. Patchy subpleural opacities at both lung bases, likely reflecting atelectasis. No consolidation. Mild aortic and coronary artery atherosclerosis are noted. There is a small hiatal hernia. Hepatobiliary: The liver appears unremarkable as imaged in the noncontrast state.  No evidence of gallstones, gallbladder wall thickening or biliary dilatation. Pancreas: Unremarkable. No pancreatic ductal dilatation or surrounding inflammatory changes. Spleen: Normal in size without focal abnormality. Adrenals/Urinary Tract: Both adrenal glands appear normal. The kidneys  appear normal without evidence of urinary tract calculus, suspicious lesion or hydronephrosis. The bladder appears unremarkable for its degree of distention. Stomach/Bowel: No enteric contrast administered. As above, small hiatal hernia. The stomach otherwise appears unremarkable. No bowel wall thickening, distention or surrounding inflammation. The appendix appears normal. There is prominent stool throughout the colon. Mild sigmoid colon diverticular changes are present. Vascular/Lymphatic: There are no enlarged abdominal or pelvic lymph nodes. Aortic and branch vessel atherosclerosis without acute vascular findings on noncontrast imaging. Reproductive: The prostate gland and seminal vesicles appear unremarkable. Other: No evidence of abdominal wall mass or hernia. No ascites. Musculoskeletal: No acute or significant osseous findings. Mild multilevel lumbar spondylosis. IMPRESSION: 1. No acute findings or explanation for the patient's symptoms. 2. Emphysema and probable atelectasis at both lung bases. 3. Prominent stool throughout the colon, suggesting constipation. Mild sigmoid diverticulosis without acute inflammation. 4. Aortic Atherosclerosis (ICD10-I70.0) and Emphysema (ICD10-J43.9). Electronically Signed   By: Carey Bullocks M.D.   On: 02/07/2022 15:47    Microbiology: Results for orders placed or performed during the hospital encounter of 02/07/22  Culture, blood (single)     Status: None   Collection Time: 02/07/22  3:15 PM   Specimen: BLOOD  Result Value Ref Range Status   Specimen Description BLOOD RIGHT ANTECUBITAL  Final   Special Requests   Final    BOTTLES DRAWN AEROBIC AND ANAEROBIC Blood Culture adequate volume   Culture   Final    NO GROWTH 5 DAYS Performed at [facility], [address] [place], [place] [address]    Report Status 02/12/2022 FINAL  Final  Urine Culture     Status: Abnormal   Collection Time: 02/07/22  6:26 PM   Specimen: Urine, Clean Catch  Result  Value Ref Range Status   Specimen Description   Final    URINE, CLEAN CATCH Performed at [facility], [address], [place], [place] [address]    Special Requests   Final    NONE Performed at [facility], [address] [place], [place] [address]    Culture MULTIPLE SPECIES PRESENT, SUGGEST RECOLLECTION (A)  Final   Report Status 02/09/2022 FINAL  Final  Respiratory (~20 pathogens) panel by PCR     Status: None   Collection Time: 02/08/22 10:17 AM   Specimen: Nasopharyngeal Swab; Respiratory  Result Value Ref Range Status   Adenovirus NOT DETECTED NOT DETECTED Final   Coronavirus 229E NOT DETECTED NOT DETECTED Final    Comment: (NOTE) The Coronavirus on the Respiratory Panel, DOES NOT test for the novel  Coronavirus (2019 nCoV)    Coronavirus HKU1 NOT DETECTED NOT DETECTED Final   Coronavirus NL63 NOT DETECTED NOT DETECTED Final   Coronavirus OC43 NOT DETECTED NOT DETECTED Final   Metapneumovirus NOT DETECTED NOT DETECTED Final   Rhinovirus / Enterovirus NOT DETECTED NOT DETECTED Final   Influenza A NOT DETECTED NOT DETECTED Final   Influenza B NOT DETECTED NOT DETECTED Final   Parainfluenza Virus 1 NOT DETECTED NOT DETECTED Final   Parainfluenza Virus 2 NOT DETECTED NOT DETECTED Final   Parainfluenza Virus 3 NOT DETECTED NOT DETECTED Final   Parainfluenza Virus 4 NOT DETECTED NOT DETECTED Final   Respiratory Syncytial Virus NOT DETECTED NOT DETECTED Final   Bordetella pertussis  NOT DETECTED NOT DETECTED Final   Bordetella Parapertussis NOT DETECTED NOT DETECTED Final   Chlamydophila pneumoniae NOT DETECTED NOT DETECTED Final   Mycoplasma pneumoniae NOT DETECTED NOT DETECTED Final    Comment: Performed at [facility], [address], [place], [place] [address]  SARS Coronavirus 2 by RT PCR (hospital order, performed in [facility] hospital lab) *cepheid single result test* Anterior Nasal Swab     Status: None   Collection Time: 02/08/22 10:17 AM    Specimen: Anterior Nasal Swab  Result Value Ref Range Status   SARS Coronavirus 2 by RT PCR NEGATIVE NEGATIVE Final    Comment: (NOTE) SARS-CoV-2 target nucleic acids are NOT DETECTED.  The SARS-CoV-2 RNA is generally detectable in upper and lower respiratory specimens during the acute phase of infection. The lowest concentration of SARS-CoV-2 viral copies this assay can detect is 250 copies / mL. A negative result does not preclude SARS-CoV-2 infection and should not be used as the sole basis for treatment or other patient management decisions.  A negative result may occur with improper specimen collection / handling, submission of specimen other than nasopharyngeal swab, presence of viral mutation(s) within the areas targeted by this assay, and inadequate number of viral copies (<250 copies / mL). A negative result must be combined with clinical observations, patient history, and epidemiological information.  Fact Sheet for Patients:   RoadLapTop.co.za  Fact Sheet for Healthcare Providers: http://kim-miller.com/  This test is not yet approved or  cleared by the [place] FDA and has been authorized for detection and/or diagnosis of SARS-CoV-2 by FDA under an Emergency Use Authorization (EUA).  This EUA will remain in effect (meaning this test can be used) for the duration of the COVID-19 declaration under Section 564(b)(1) of the Act, 21 U.S.C. section 360bbb-3(b)(1), unless the authorization is terminated or revoked sooner.  Performed at [facility], [address] [place], [place] [address]   MRSA Next Gen by PCR, Nasal     Status: None   Collection Time: 02/08/22 10:17 AM   Specimen: Nasal Mucosa; Nasal Swab  Result Value Ref Range Status   MRSA by PCR Next Gen NOT DETECTED NOT DETECTED Final    Comment: (NOTE) The GeneXpert MRSA Assay (FDA approved for NASAL specimens only), is one component of a  comprehensive MRSA colonization surveillance program. It is not intended to diagnose MRSA infection nor to guide or monitor treatment for MRSA infections. Test performance is not FDA approved in patients less than 62 years old. Performed at [facility], [address] [place], [place] [address]   Culture, blood (Routine X 2) w Reflex to ID Panel     Status: None   Collection Time: 02/10/22  5:12 PM   Specimen: BLOOD  Result Value Ref Range Status   Specimen Description BLOOD BLOOD LEFT ARM  Final   Special Requests   Final    BOTTLES DRAWN AEROBIC AND ANAEROBIC Blood Culture adequate volume   Culture   Final    NO GROWTH 5 DAYS Performed at [facility], [address] [place], [place] [address]    Report Status 02/15/2022 FINAL  Final  Culture, blood (Routine X 2) w Reflex to ID Panel     Status: None   Collection Time: 02/10/22  5:16 PM   Specimen: BLOOD  Result Value Ref Range Status   Specimen Description BLOOD BLOOD RIGHT ARM  Final   Special Requests   Final  BOTTLES DRAWN AEROBIC AND ANAEROBIC Blood Culture adequate volume   Culture   Final    NO GROWTH 5 DAYS Performed at [facility], [address] [place], [place] [address]    Report Status 02/15/2022 FINAL  Final  SARS Coronavirus 2 by RT PCR (hospital order, performed in [facility] hospital lab) *cepheid single result test* Anterior Nasal Swab     Status: None   Collection Time: 02/11/22  3:20 PM   Specimen: Anterior Nasal Swab  Result Value Ref Range Status   SARS Coronavirus 2 by RT PCR NEGATIVE NEGATIVE Final    Comment: (NOTE) SARS-CoV-2 target nucleic acids are NOT DETECTED.  The SARS-CoV-2 RNA is generally detectable in upper and lower respiratory specimens during the acute phase of infection. The lowest concentration of SARS-CoV-2 viral copies this assay can detect is 250 copies / mL. A negative result does not preclude SARS-CoV-2 infection and should not be used as  the sole basis for treatment or other patient management decisions.  A negative result may occur with improper specimen collection / handling, submission of specimen other than nasopharyngeal swab, presence of viral mutation(s) within the areas targeted by this assay, and inadequate number of viral copies (<250 copies / mL). A negative result must be combined with clinical observations, patient history, and epidemiological information.  Fact Sheet for Patients:   RoadLapTop.co.za  Fact Sheet for Healthcare Providers: http://kim-miller.com/  This test is not yet approved or  cleared by the [place] FDA and has been authorized for detection and/or diagnosis of SARS-CoV-2 by FDA under an Emergency Use Authorization (EUA).  This EUA will remain in effect (meaning this test can be used) for the duration of the COVID-19 declaration under Section 564(b)(1) of the Act, 21 U.S.C. section 360bbb-3(b)(1), unless the authorization is terminated or revoked sooner.  Performed at [facility], [address] [place], [place] [address]     Labs: CBC: Recent Labs  Lab 02/09/22 [id number] 02/10/22 0522 02/11/22 0509 02/12/22 0708 02/13/22 0604  WBC 13.2* 10.7* 10.9* 8.3 5.9  HGB 12.1* 12.1* 12.9* 12.8* 12.1*  HCT 37.2* 36.6* 39.5 39.5 37.4*  MCV 89.6 88.4 90.2 90.4 90.6  PLT 176 177 215 207 211   Basic Metabolic Panel: Recent Labs  Lab 02/09/22 0640 02/10/22 0522 02/11/22 0509 02/12/22 0708 02/13/22 0604 02/14/22 0400 02/15/22 0431  NA 137 134* 142 141 141  --   --   K 3.3* 3.3* 3.3* 3.1* 3.6  --   --   CL 106 104 107 109 111  --   --   CO2 22 21* --   --   GLUCOSE 128* 152* 136* 123* 126*  --   --   BUN 24* 27* 34* 49* 52*  --   --   CREATININE 1.70* 1.77* 1.93* 2.06* 1.91*  --   --   CALCIUM 8.2* 8.1* 8.9 8.4* 8.2*  --   --   MG 1.9 2.1 2.4 2.6* 2.5* 2.6* 2.5*   Liver Function Tests: Recent Labs  Lab  02/09/22 0640  AST 51*  ALT 22  ALKPHOS 55  BILITOT 1.6*  PROT 7.1  ALBUMIN 3.5   CBG: No results for input(s): "GLUCAP" in the last 168 hours.  Discharge time spent: greater than 30 minutes.  Signed: Enedina Finner, MD [facility] 02/15/2022

## 2022-02-15 NOTE — Progress Notes (Signed)
Report given to Lupita Leash RN at [facility]. Assisted patient with getting dressed at this time. PIV removed. Daughter [name] will pick patient up and bring to [facility]. No further needs. AVS reviewed with Lupita Leash and [name].

## 2022-02-15 NOTE — [place] FL2 (Signed)
[place] MEDICAID FL2 LEVEL OF CARE SCREENING TOOL     IDENTIFICATION  Patient Name: Frederick Simon Birthdate: 09-Oct-1926 Sex: male Admission Date (Current Location): 02/07/2022  [facility] and [place] Number:  Chiropodist and Address:  [facility], [address] [place], [place] [address]      Provider Number: [id number]  Attending Physician Name and Address:  Enedina Finner, MD  Relative Name and Phone Number:  Norwood Levo [phone]    Current Level of Care: Hospital Recommended Level of Care: Skilled Nursing Facility Prior Approval Number:    Date Approved/Denied:   PASRR Number: [id number]  Discharge Plan: SNF    Current Diagnoses: Patient Active Problem List   Diagnosis Date Noted   Nausea & vomiting 02/11/2022   Severe sepsis (HCC) 02/10/2022   Hypokalemia 02/10/2022   Acute hypoxemic respiratory failure (HCC) 02/10/2022   CAP (community acquired pneumonia) 02/09/2022   AKI (acute kidney injury) (HCC) 02/09/2022   Prediabetes 02/07/2022   Generalized weakness 02/07/2022   Elevated troponin 02/07/2022   Stage 3b chronic kidney disease (HCC) 03/08/2021   Mild cognitive impairment 08/03/2020   Osteoarthritis of multiple joints 08/03/2020   Hypertension    Hyperlipidemia    Allergic rhinitis    Gastroesophageal reflux disease     Orientation RESPIRATION BLADDER Height & Weight     Self, Time, Situation, Place  Normal External catheter Weight: 84 kg Height:  5\' 11"  (180.3 cm)  BEHAVIORAL SYMPTOMS/MOOD NEUROLOGICAL BOWEL NUTRITION STATUS  Other (Comment) (n/a)  (n/a) Continent Diet  AMBULATORY STATUS COMMUNICATION OF NEEDS Skin   Limited Assist Verbally Normal                       Personal Care Assistance Level of Assistance  Bathing, Dressing Bathing Assistance: Limited assistance   Dressing Assistance: Limited assistance     Functional Limitations Info  Sight, Hearing Sight Info: Impaired Hearing Info:  Impaired      SPECIAL CARE FACTORS FREQUENCY  PT (By licensed PT), OT (By licensed OT)                    Contractures Contractures Info: Not present    Additional Factors Info  Code Status, Allergies Code Status Info: DNR Allergies Info: : No Known Allergies           Current Medications (02/15/2022):  This is the current hospital active medication list Current Facility-Administered Medications  Medication Dose Route Frequency Provider Last Rate Last Admin   acetaminophen (TYLENOL) tablet 1,000 mg  1,000 mg Oral Q6H PRN 04/17/2022, MD   1,000 mg at 02/13/22 2134   aspirin EC tablet 81 mg  81 mg Oral Daily 2135, MD   81 mg at 02/14/22 1516   bisacodyl (DULCOLAX) suppository 10 mg  10 mg Rectal Daily 04/16/22, MD   10 mg at 02/14/22 04/16/22   cholecalciferol (VITAMIN D3) 25 MCG (1000 UNIT) tablet 2,000 Units  2,000 Units Oral Daily 6384, MD   2,000 Units at 02/14/22 1748   cyanocobalamin (VITAMIN B12) tablet 1,000 mcg  1,000 mcg Oral Daily 04/16/22, MD   1,000 mcg at 02/14/22 1516   dextromethorphan-guaiFENesin (MUCINEX DM) 30-600 MG per 12 hr tablet 1 tablet  1 tablet Oral BID PRN 04/16/22, MD   1 tablet at 02/12/22 0923   docusate sodium (COLACE) capsule 100 mg  100 mg Oral BID 04/14/22, MD  100 mg at 02/14/22 2323   enoxaparin (LOVENOX) injection 30 mg  30 mg Subcutaneous Q24H Foye Deer, RPH   30 mg at 02/14/22 4174   hydrALAZINE (APRESOLINE) injection 10 mg  10 mg Intravenous Q4H PRN Amin, Ankit Chirag, MD   10 mg at 02/11/22 0813   hydrALAZINE (APRESOLINE) tablet 50 mg  50 mg Oral TID Darlin Priestly, MD   50 mg at 02/14/22 2323   hydrocortisone cream 1 %   Topical BID Darlin Priestly, MD   Given at 02/14/22 2323   ipratropium-albuterol (DUONEB) 0.5-2.5 (3) MG/3ML nebulizer solution 3 mL  3 mL Nebulization Q4H PRN Amin, Ankit Chirag, MD   3 mL at 02/09/22 0619   losartan (COZAAR) tablet 50 mg  50 mg Oral Daily Enedina Finner, MD   50 mg at  02/14/22 1516   metoprolol tartrate (LOPRESSOR) tablet 100 mg  100 mg Oral BID Darlin Priestly, MD   100 mg at 02/14/22 2323   ondansetron (ZOFRAN) injection 4 mg  4 mg Intravenous TID PRN Enedina Finner, MD       pantoprazole (PROTONIX) EC tablet 40 mg  40 mg Oral Daily Enedina Finner, MD   40 mg at 02/14/22 1748   polyethylene glycol (MIRALAX / GLYCOLAX) packet 34 g  34 g Oral BID Darlin Priestly, MD   34 g at 02/14/22 2323   promethazine (PHENERGAN) 12.5 mg in sodium chloride 0.9 % 50 mL IVPB  12.5 mg Intravenous Q6H PRN Darlin Priestly, MD   Stopped at 02/13/22 2202   senna-docusate (Senokot-S) tablet 1 tablet  1 tablet Oral QHS PRN Amin, Ankit Chirag, MD       simvastatin (ZOCOR) tablet 20 mg  20 mg Oral Daily Manuela Schwartz, NP   20 mg at 02/14/22 0814   traZODone (DESYREL) tablet 50 mg  50 mg Oral QHS PRN Dimple Nanas, MD         Discharge Medications: Please see discharge summary for a list of discharge medications.  Relevant Imaging Results:  Relevant Lab Results:   Additional Information SS# [id number]  Truddie Hidden, RN

## 2022-02-15 NOTE — Progress Notes (Signed)
[facility]  - [place] [facility]   PATIENT NAME: Frederick Simon    MR#:  [id number]  DATE OF BIRTH:  1927-03-03  SUBJECTIVE:   Eating breakfast. Feels  better. No respiratory distress. Weaned to room air. Sats 94%. No family at bedside. No fever. No issues per RN overnight.  VITALS:  Blood pressure (!) 146/63, pulse 83, temperature 97.8 F (36.6 C), resp. rate 17, height 5\' 11"  (1.803 m), weight 85.6 kg, SpO2 92 %.  PHYSICAL EXAMINATION:   GENERAL:  88 y.o.-year-old patient lying in the bed with no acute distress.  LUNGS: Normal breath sounds bilaterally, no wheezing, rales, rhonchi.  CARDIOVASCULAR: S1, S2 normal. No murmurs, rubs, or gallops.  ABDOMEN: Soft, nontender, nondistended. Bowel sounds present.  EXTREMITIES: No  edema b/l.    NEUROLOGIC: nonfocal  patient is alert and awake SKIN: No obvious rash, lesion, or ulcer.   LABORATORY PANEL:  CBC Recent Labs  Lab 02/13/22 0604  WBC 5.9  HGB 12.1*  HCT 37.4*  PLT 211     Chemistries  Recent Labs  Lab 02/09/22 0640 02/10/22 0522 02/13/22 0604 02/14/22 0400 02/15/22 0431  NA 137   < > 141  --   --   K 3.3*   < > 3.6  --   --   CL 106   < > 111  --   --   CO2 22   < > 22  --   --   GLUCOSE 128*   < > 126*  --   --   BUN 24*   < > 52*  --   --   CREATININE 1.70*   < > 1.91*  --   --   CALCIUM 8.2*   < > 8.2*  --   --   MG 1.9   < > 2.5*   < > 2.5*  AST 51*  --   --   --   --   ALT 22  --   --   --   --   ALKPHOS 55  --   --   --   --   BILITOT 1.6*  --   --   --   --    < > = values in this interval not displayed.    Cardiac Enzymes No results for input(s): "TROPONINI" in the last 168 hours. RADIOLOGY:  No results found.  Assessment and Plan 88 year old with history of HTN, HLD, GERD comes to the hospital from nursing facility with complaints of fevers, chills and weakness.  Also has some nausea.   CAP (community acquired pneumonia) --leukocytosis, procal 0.58, hypoxia, cough  with blood-tinged sputum, CXR with Bilateral patchy opacities.  covid neg.   --started on vanc/cefe/flagyl on presentation, then switched to ceftriaxone and azithromycin --MRSA screen neg --procal improving --cont ceftriaxone and azithromycin for now- --ID consult appreciated--d/c abxs on 02/14/22   Generalized weakness Likely due to PNA --PT recommends rehab   Elevated troponin 2/2 demand ischemia in the setting of infection and anemia.    Prediabetes A1c 6.2.   Hypertension uncontrolled --resuming at lower dose 50 mg home losartan  --cont Lopressor to 100 mg BID (new) --start hydralazine 50 mg TID   Stage 3b chronic kidney disease (HCC) With AKI.  creat improving  Gastroesophageal reflux disease Cont oral PPI   Nausea & vomiting --maybe due to constipation or GI viral illness--pt tolerating po diet --cont scheduled Miralax and give dulcolax  today  Acute hypoxemic  respiratory failure (HCC) --was put on 2L with intermittent desat to 88% --resolved --sats 94% on RA   Hypokalemia --monitor and replete PRN   Severe sepsis (HCC) --presented with tachycardia, leukocytosis, AKI--now improved         DVT prophylaxis: Lovenox SQ Code Status: DNR  Family Communication: discussed with daughter [name] on the phone 9/7 Level of care: Med-Surg  Patient is medically best at baseline. Per [facility] awaiting insurance authorization for going to [facility] [place] [facility]  Status is: Inpatient Remains inpatient appropriate because: pending bed availability at [facility]    TOTAL TIME TAKING CARE OF THIS PATIENT: 35 minutes.  >50% time spent on counselling and coordination of care  Note: This dictation was prepared with Dragon dictation along with smaller phrase technology. Any transcriptional errors that result from this process are unintentional.  Enedina Finner M.D    [facility]   CC: Primary care physician; Earnestine Mealing, MD

## 2022-02-18 ENCOUNTER — Non-Acute Institutional Stay (SKILLED_NURSING_FACILITY): Payer: Medicare Other | Admitting: Student

## 2022-02-18 ENCOUNTER — Encounter: Payer: Self-pay | Admitting: Student

## 2022-02-18 DIAGNOSIS — R112 Nausea with vomiting, unspecified: Secondary | ICD-10-CM

## 2022-02-18 DIAGNOSIS — E876 Hypokalemia: Secondary | ICD-10-CM

## 2022-02-18 DIAGNOSIS — N1832 Chronic kidney disease, stage 3b: Secondary | ICD-10-CM

## 2022-02-18 DIAGNOSIS — K219 Gastro-esophageal reflux disease without esophagitis: Secondary | ICD-10-CM

## 2022-02-18 DIAGNOSIS — G3184 Mild cognitive impairment, so stated: Secondary | ICD-10-CM | POA: Diagnosis not present

## 2022-02-18 DIAGNOSIS — H9113 Presbycusis, bilateral: Secondary | ICD-10-CM

## 2022-02-18 DIAGNOSIS — R531 Weakness: Secondary | ICD-10-CM

## 2022-02-18 DIAGNOSIS — J189 Pneumonia, unspecified organism: Secondary | ICD-10-CM | POA: Diagnosis not present

## 2022-02-18 DIAGNOSIS — I10 Essential (primary) hypertension: Secondary | ICD-10-CM

## 2022-02-18 MED ORDER — ONDANSETRON HCL 4 MG PO TABS: 8.0000 mg | ORAL_TABLET | Freq: Three times a day (TID) | ORAL | 0 refills | Status: DC | PRN

## 2022-02-18 NOTE — Progress Notes (Signed)
Provider:   Location:  Other Nursing Home Room Number: 106 Place of Service:  SNF (31)  PCP: Frederick Shorter, MD Patient Care Team: Frederick Shorter, MD as PCP - General (Family Medicine) Frederick Chandler, NP as Nurse Practitioner (Geriatric Medicine) Frederick Hampshire, MD as Consulting Physician (Cardiology)  Extended Emergency Contact Information Primary Emergency Contact: [place] Mobile Phone: [phone] Relation: Daughter Secondary Emergency Contact: [place] Phone: [phone] Mobile Phone: [phone] Relation: Daughter  Code Status: DNAR Goals of Care: Advanced Directive information    02/08/2022    8:00 PM  Advanced Directives  Does Patient Have a Medical Advance Directive? Yes  Type of Advance Directive [place]  Does patient want to make changes to medical advance directive? No - Patient declined      Chief Complaint  Patient presents with   Pneumonia    HPI: Patient is a 88 y.o. male seen today for admission to skilled nursing. He was fine and went to dinner with one of his four daughters.  He started shivering after eating [place] food and was unable to eat because of vomiting. They weren't sure if he had a GI bug that led to his symptoms.  He still has some nausea. He gained weight since being at [facility], but no notable weight loss in the hospital. He vomited in the hospital but not since discharge from hospital.   He lives in [place] He gets help with medication and laundry but is otherwise independent.  His daughter is here today and aids with the history.  He has 4 daughters.    Past Medical History:  Diagnosis Date   Allergic rhinitis    Gastroesophageal reflux disease    Hyperlipidemia    Hypertension    Past Surgical History:  Procedure Laterality Date   BACK SURGERY  May 1975    Dr. Dairl Ponder SURGERY  Feb 2004   Dr.  Sherwood Gambler    HAND SURGERY  [phone]   Dr. Telford Nab   NOSE SURGERY  1989   Dr. Wylene Men SURGERY  [phone]   Dr. Telford Nab    reports that he has quit smoking. He has never used smokeless tobacco. He reports that he does not drink alcohol and does not use drugs. Social History   Socioeconomic History   Marital status: Widowed    Spouse name: Not on file   Number of children: 4   Years of education: Not on file   Highest education level: Not on file  Occupational History   Occupation: Scientist, forensic: [employer]  Tobacco Use   Smoking status: Former   Smokeless tobacco: Never   Tobacco comments:    quit 1980's  Substance and Sexual Activity   Alcohol use: No   Drug use: No   Sexual activity: Not on file  Other Topics Concern   Not on file  Social History Narrative   4 daughters      Has advanced directives   Daughter Frederick Simon is health care POA   Requests DNR--done 08/03/20   No feeding tube if cognitively unaware   Social Determinants of Health   Financial Resource Strain: Not on file  Food Insecurity: Not on file  Transportation Needs: Not on file  Physical Activity: Not on file  Stress: Not on file  Social Connections: Not on file  Intimate Partner Violence: Not on file  Functional Status Survey:    Family History  Problem Relation Age of Onset   Cancer Mother    Hypertension Mother    Hypertension Father    Stroke Father    Lupus Sister     Health Maintenance  Topic Date Due   COVID-19 Vaccine (1) Never done   TETANUS/TDAP  Never done   Zoster Vaccines- Shingrix (1 of 2) Never done   Pneumonia Vaccine 29+ Years old (1 - PCV) Never done   INFLUENZA VACCINE  Never done   HPV VACCINES  Aged Out    No Known Allergies  Outpatient Encounter Medications as of 02/18/2022  Medication Sig   acetaminophen (TYLENOL) 500 MG tablet Take 1,000 mg by mouth every 8 (eight) hours as needed for mild pain or moderate pain.   albuterol  (VENTOLIN HFA) 108 (90 Base) MCG/ACT inhaler Inhale 1-2 puffs into the lungs every 6 (six) hours as needed for wheezing or shortness of breath. (Patient taking differently: Inhale 1-2 puffs into the lungs every 4 (four) hours as needed for shortness of breath.)   alum & mag hydroxide-simeth (MAALOX/MYLANTA) 200-200-20 MG/5ML suspension Take 30 mLs by mouth every 4 (four) hours as needed for indigestion, heartburn or flatulence.   aspirin EC 81 MG tablet Take 81 mg by mouth daily.   carbamide peroxide (DEBROX) 6.5 % OTIC solution Place 5 drops into both ears as needed (for ear wax).   Cholecalciferol (VITAMIN D) 50 MCG (2000 UT) CAPS Take by mouth.   diphenhydrAMINE (BENADRYL) 25 mg capsule Take 25 mg by mouth once as needed (for allergic reaction).   hydrALAZINE (APRESOLINE) 50 MG tablet Take 1 tablet (50 mg total) by mouth 3 (three) times daily.   losartan (COZAAR) 100 MG tablet Take 100 mg by mouth daily.   metoprolol tartrate (LOPRESSOR) 100 MG tablet Take 1 tablet (100 mg total) by mouth 2 (two) times daily.   omeprazole (PRILOSEC) 20 MG capsule Take 1 capsule (20 mg total) by mouth daily.   ondansetron (ZOFRAN) 4 MG tablet Take 1 tablet (4 mg total) by mouth every 8 (eight) hours as needed for nausea or vomiting.   simvastatin (ZOCOR) 20 MG tablet Take 20 mg by mouth daily.   vitamin B-12 (CYANOCOBALAMIN) 1000 MCG tablet Take 1,000 mcg by mouth daily.   No facility-administered encounter medications on file as of 02/18/2022.    Review of Systems  Unable to perform ROS: Other (Difficult to perform due to dementia and poor hearing)  Constitutional:  Positive for activity change.  HENT:         Trouble hearing  Respiratory:         Breathing well on room air -- able to pull 1200 mL on IS   Cardiovascular: Negative.   Gastrointestinal:  Positive for nausea.  Endocrine: Negative.   Genitourinary: Negative.   Psychiatric/Behavioral:  Negative for behavioral problems and confusion.      Vitals:   02/18/22 0814  BP: (!) 142/72  Pulse: 66  Resp: 18  Temp: (!) 97 F (36.1 C)  SpO2: 95%  Weight: 179 lb (81.2 kg)   Body mass index is 24.97 kg/m. Physical Exam Constitutional:      Appearance: Normal appearance.  Cardiovascular:     Rate and Rhythm: Normal rate and regular rhythm.     Pulses: Normal pulses.     Heart sounds: Normal heart sounds.  Pulmonary:     Effort: Pulmonary effort is normal.     Breath sounds: Normal  breath sounds.  Abdominal:     General: Abdomen is flat.     Palpations: Abdomen is soft.  Musculoskeletal:        General: No swelling or tenderness.  Skin:    General: Skin is warm and dry.  Neurological:     General: No focal deficit present.     Mental Status: He is alert and oriented to person, place, and time. Mental status is at baseline.  Psychiatric:        Mood and Affect: Mood normal.     Labs reviewed: Basic Metabolic Panel: Recent Labs    02/11/22 0509 02/12/22 0708 02/13/22 0604 02/14/22 0400 02/15/22 0431  NA 142 141 141  --   --   K 3.3* 3.1* 3.6  --   --   CL 107 109 111  --   --   CO2 23 24 22   --   --   GLUCOSE 136* 123* 126*  --   --   BUN 34* 49* 52*  --   --   CREATININE 1.93* 2.06* 1.91*  --   --   CALCIUM 8.9 8.4* 8.2*  --   --   MG 2.4 2.6* 2.5* 2.6* 2.5*   Liver Function Tests: Recent Labs    02/07/22 1345 02/09/22 0640  AST 28 51*  ALT 20 22  ALKPHOS 56 55  BILITOT 1.3* 1.6*  PROT 7.8 7.1  ALBUMIN 4.2 3.5   Recent Labs    02/07/22 1345  LIPASE 31   No results for input(s): "AMMONIA" in the last 8760 hours. CBC: Recent Labs    02/07/22 1807 02/09/22 0640 02/11/22 0509 02/12/22 0708 02/13/22 0604  WBC 12.0*   < > 10.9* 8.3 5.9  NEUTROABS 10.0*  --   --   --   --   HGB 11.7*   < > 12.9* 12.8* 12.1*  HCT 35.9*   < > 39.5 39.5 37.4*  MCV 92.5   < > 90.2 90.4 90.6  PLT 147*   < > 215 207 211   < > = values in this interval not displayed.   Cardiac Enzymes: No results for  input(s): "CKTOTAL", "CKMB", "CKMBINDEX", "TROPONINI" in the last 8760 hours. BNP: Invalid input(s): "POCBNP" Lab Results  Component Value Date   HGBA1C 6.2 (H) 02/09/2022   Lab Results  Component Value Date   TSH 0.091 (L) 02/07/2022   Lab Results  Component Value Date   VITAMINB12 1,155 (H) 02/08/2022   Lab Results  Component Value Date   FOLATE 8.9 02/08/2022   Lab Results  Component Value Date   IRON 27 (L) 02/08/2022   TIBC 253 02/08/2022   FERRITIN 108 02/08/2022    Imaging and Procedures obtained prior to SNF admission: ECHOCARDIOGRAM COMPLETE  Result Date: 02/08/2022    ECHOCARDIOGRAM REPORT   Patient Name:   Frederick Simon Date of Exam: 02/08/2022 Medical Rec #:  04/10/2022      Height:       71.0 in Accession #:    [id number]     Weight:       190.0 lb Date of Birth:  Jan 21, 1927      BSA:          2.063 m Patient Age:    89 years       BP:           150/80 mmHg Patient Gender: M  HR:           77 bpm. Exam Location:  Inpatient Procedure: 2D Echo, Cardiac Doppler, Color Doppler and Intracardiac            Opacification Agent Indications:     CHF-Acute Systolic [id number]  History:         Patient has prior history of Echocardiogram examinations. Risk                  Factors:Hypertension and Dyslipidemia. Anemia.  Sonographer:     Darlina Sicilian RDCS Referring Phys:  [place] Diagnosing Phys: Ida Rogue MD IMPRESSIONS  1. Left ventricular ejection fraction, by estimation, is 55 to 60%. The left ventricle has normal function. The left ventricle has no regional wall motion abnormalities. Left ventricular diastolic parameters are consistent with Grade I diastolic dysfunction (impaired relaxation).  2. Right ventricular systolic function is normal. The right ventricular size is normal. There is normal pulmonary artery systolic pressure. The estimated right ventricular systolic pressure is 0000000 mmHg.  3. The mitral valve was not well visualized. No evidence of  mitral valve regurgitation. No evidence of mitral stenosis.  4. The aortic valve was not well visualized. Aortic valve regurgitation is not visualized. No aortic stenosis is present.  5. The inferior vena cava is normal in size with greater than 50% respiratory variability, suggesting right atrial pressure of 3 mmHg. FINDINGS  Left Ventricle: Left ventricular ejection fraction, by estimation, is 55 to 60%. The left ventricle has normal function. The left ventricle has no regional wall motion abnormalities. Definity contrast agent was given IV to delineate the left ventricular  endocardial borders. The left ventricular internal cavity size was normal in size. There is no left ventricular hypertrophy. Left ventricular diastolic parameters are consistent with Grade I diastolic dysfunction (impaired relaxation). Right Ventricle: The right ventricular size is normal. No increase in right ventricular wall thickness. Right ventricular systolic function is normal. There is normal pulmonary artery systolic pressure. The tricuspid regurgitant velocity is 2.73 m/s, and  with an assumed right atrial pressure of 3 mmHg, the estimated right ventricular systolic pressure is 0000000 mmHg. Left Atrium: Left atrial size was normal in size. Right Atrium: Right atrial size was normal in size. Pericardium: There is no evidence of pericardial effusion. Mitral Valve: The mitral valve was not well visualized. No evidence of mitral valve regurgitation. No evidence of mitral valve stenosis. Tricuspid Valve: The tricuspid valve is not well visualized. Tricuspid valve regurgitation is mild . No evidence of tricuspid stenosis. Aortic Valve: The aortic valve was not well visualized. Aortic valve regurgitation is not visualized. No aortic stenosis is present. Pulmonic Valve: The pulmonic valve was not well visualized. Pulmonic valve regurgitation is not visualized. No evidence of pulmonic stenosis. Aorta: The aortic root was not well visualized and  the ascending aorta was not well visualized. Venous: The inferior vena cava is normal in size with greater than 50% respiratory variability, suggesting right atrial pressure of 3 mmHg. IAS/Shunts: No atrial level shunt detected by color flow Doppler.  LEFT VENTRICLE PLAX 2D LVOT diam:     2.00 cm   Diastology LV SV:         61        LV e' medial:    6.74 cm/s LV SV Index:   30        LV E/e' medial:  8.5 LVOT Area:     3.14 cm  LV e' lateral:   7.83  cm/s                          LV E/e' lateral: 7.3  RIGHT VENTRICLE TAPSE (M-mode): 2.1 cm LEFT ATRIUM             Index LA Vol (A2C):   46.0 ml 22.30 ml/m LA Vol (A4C):   38.1 ml 18.47 ml/m LA Biplane Vol: 42.1 ml 20.41 ml/m  AORTIC VALVE LVOT Vmax:   92.70 cm/s LVOT Vmean:  66.100 cm/s LVOT VTI:    0.194 m  AORTA Ao Root diam: 3.70 cm MITRAL VALVE               TRICUSPID VALVE MV Area (PHT): 2.85 cm    TR Peak grad:   29.8 mmHg MV Decel Time: 266 msec    TR Vmax:        273.00 cm/s MV E velocity: 57.40 cm/s MV A velocity: 82.70 cm/s  SHUNTS MV E/A ratio:  0.69        Systemic VTI:  0.19 m                            Systemic Diam: 2.00 cm Ida Rogue MD Electronically signed by Ida Rogue MD Signature Date/Time: 02/08/2022/3:57:33 PM    Final    NM Pulmonary Perfusion  Result Date: 02/08/2022 CLINICAL DATA:  Evaluate for acute pulmonary embolus. EXAM: NUCLEAR MEDICINE PERFUSION LUNG SCAN TECHNIQUE: Perfusion images were obtained in multiple projections after intravenous injection of radiopharmaceutical. Ventilation scans intentionally deferred if perfusion scan and chest x-ray adequate for interpretation during COVID 19 epidemic. RADIOPHARMACEUTICALS:  3.78 mCi Tc-22m MAA IV COMPARISON:  Chest radiograph from 02/07/22 FINDINGS: Large area of relative photopenia is identified within the periphery of the left mid lung. This is a nonsegmental perfusion defect. On the chest radiograph from 02/07/2022 there is a asymmetric opacity within the left midlung which  may reflect pneumonia. No additional perfusion defects identified. IMPRESSION: 1. No segmental perfusion defects identified to suggest acute pulmonary embolus. 2. Large area of relative photopenia is identified within the periphery of the left midlung with corresponding asymmetric opacity on chest radiograph from 02/07/2022 which may reflect pneumonia. Electronically Signed   By: Kerby Moors M.D.   On: 02/08/2022 14:38    Assessment/Plan 1. Community acquired pneumonia, unspecified laterality Patient is status post treatment of CAP. Pulling 1255mL on IS. No supplmental oxygen necessary at this time. Will continue to monitor fos symptoms.   2. Generalized weakness Patient feeling generalized weakness after illness and hospital stay. PT/OT ordered. Hopes to return home to assisted living facility after a couple of weeks of therapy.   3. Mild cognitive impairment Patient is alert and oriented to self with significant difficulty with short term memory. Continues to remain active and maintain healthy weight. Will monitor for weight loss in the setting of recent nausea.   4. Nausea and vomiting, unspecified vomiting type Unclear cause. No vomiting since arrival to SNF. Discussed possibility of GI viral infection which could take a couple of weeks to improve. Patient is tolerating a regular diet. Plan to continue Zofran 8 mg TID PRN for nausea. Will monitor for other signs of distress.   5. Hypokalemia Patient required supplementation while in the hospital due to hypokalemia. Most recent level was 3.6. Will collect repeat Thursday to determine need for long term supplementation.   6. Primary hypertension BP appropriate today. Will  continue to monitor for need of making BP medication changes. At this time does not have symptoms. Will continue hydralazine 50 mg TID, Losartan 100mg , metoprolol 100 mg BID.   7. Stage 3b chronic kidney disease (HCC) Patient suffered acute renal failure in the setting  of poor PO intake and infection. Will order repeat labs with BMP  8. Gastroesophageal reflux disease without esophagitis Patient with symptoms of chronic GERD will continue PRN Tums and scheduled omeprazole 20 mg daily.   9. Presbycusis, bilateral Hard of hearing -- HA available at bedside.     Family/ staff Communication: Daughter and Byrd Hesselbach.   Labs/tests ordered: BMP  Education administrator, MD, [facility] [phone]

## 2022-05-31 ENCOUNTER — Telehealth: Payer: Self-pay

## 2022-05-31 NOTE — Telephone Encounter (Signed)
Transition Care Management Unsuccessful Follow-up Telephone Call  Date of discharge and from where:  [facility] 05/30/2022 dx pneumonia  Attempts:  1st Attempt  Reason for unsuccessful TCM follow-up call:  No answer/busy Karena Addison, LPN [facility] Nurse Health Advisor Direct Dial [phone]

## 2022-06-05 NOTE — Telephone Encounter (Signed)
Transition Care Management Unsuccessful Follow-up Telephone Call  Date of discharge and from where:  [facility] 05/30/2022  Attempts:  2nd Attempt  Reason for unsuccessful TCM follow-up call:  Left voice message Karena Addison, LPN [facility] Nurse Health Advisor Direct Dial [phone]

## 2022-06-06 NOTE — Telephone Encounter (Signed)
Transition Care Management Unsuccessful Follow-up Telephone Call  Date of discharge and from where:  [facility] 05/30/2022  Attempts:  3rd Attempt  Reason for unsuccessful TCM follow-up call:  Left voice message Karena Addison, LPN [facility] Nurse Health Advisor Direct Dial [phone]

## 2022-06-12 ENCOUNTER — Non-Acute Institutional Stay: Payer: Medicare Other | Admitting: Student

## 2022-06-12 ENCOUNTER — Encounter: Payer: Self-pay | Admitting: Student

## 2022-06-12 ENCOUNTER — Other Ambulatory Visit: Payer: Self-pay | Admitting: Student

## 2022-06-12 DIAGNOSIS — N1832 Chronic kidney disease, stage 3b: Secondary | ICD-10-CM

## 2022-06-12 DIAGNOSIS — G8929 Other chronic pain: Secondary | ICD-10-CM

## 2022-06-12 DIAGNOSIS — H9113 Presbycusis, bilateral: Secondary | ICD-10-CM | POA: Diagnosis not present

## 2022-06-12 DIAGNOSIS — M545 Low back pain, unspecified: Secondary | ICD-10-CM

## 2022-06-12 DIAGNOSIS — I10 Essential (primary) hypertension: Secondary | ICD-10-CM | POA: Diagnosis not present

## 2022-06-12 DIAGNOSIS — K219 Gastro-esophageal reflux disease without esophagitis: Secondary | ICD-10-CM | POA: Diagnosis not present

## 2022-06-12 DIAGNOSIS — G3184 Mild cognitive impairment, so stated: Secondary | ICD-10-CM | POA: Diagnosis not present

## 2022-06-12 MED ORDER — LIDOCAINE 5 % EX PTCH: 1.0000 | MEDICATED_PATCH | CUTANEOUS | 0 refills | Status: DC

## 2022-06-12 NOTE — Progress Notes (Signed)
Ordering lidoderm patch for back pain to be mailed to patient.

## 2022-06-12 NOTE — Progress Notes (Unsigned)
Location:  Other [place].  Nursing Home Room Number: [place] [id number] Place of Service:  ALF [phone]) Provider:  Dr. Sherri Rad, MD  Patient Care Team: Earnestine Mealing, MD as PCP - General (Family Medicine) Sharon Seller, NP as Nurse Practitioner (Geriatric Medicine) Iran Ouch, MD as Consulting Physician (Cardiology)  Extended Emergency Contact Information Primary Emergency Contact: [facility] Phone: [phone] Relation: Daughter Secondary Emergency Contact: [name],[name] Home Phone: [phone] Mobile Phone: [phone] Relation: Daughter  Code Status:  DNR Goals of care: Advanced Directive information    06/12/2022   12:06 PM  Advanced Directives  Does Patient Have a Medical Advance Directive? Yes  Type of Advance Directive Out of facility DNR (pink MOST or yellow form)  Does patient want to make changes to medical advance directive? No - Patient declined     Chief Complaint  Patient presents with   Medical Management of Chronic Issues    Medical Management of Chronic Issues.     HPI:  Pt is a 89 y.o. male seen today for medical management of chronic diseases.    Before illness he was only using a cane, he is now using a rollator. He has his daughter, Mindi Junker present today.   He is walking more stable with rollator.   He has had some near falls, but none since the recent hospitalization. They played 2 games of Uno. He says he lets her win she keeps coming back.   [place] which is the most they have done since the hospital.  His only chronic complaint is back pain which despite walking today has not had issues.   He's doing well with eating, "sometimes I eat too fast." Denies issues with acid reflux.     Past Medical History:  Diagnosis Date   Allergic rhinitis    Gastroesophageal reflux disease    Hyperlipidemia    Hypertension    Past Surgical History:  Procedure  Laterality Date   BACK SURGERY  May 1975    Dr. Hilton Cork SURGERY  Feb 2004   Dr. Newell Coral    HAND SURGERY  [phone]   Dr. Priscille Kluver   NOSE SURGERY  1989   Dr. Ned Grace SURGERY  [phone]   Dr. Priscille Kluver    Allergies  Allergen Reactions   Dust Mite Extract Other (See Comments)    sneeze   Wool Alcohol [Lanolin] Itching    Outpatient Encounter Medications as of 06/12/2022  Medication Sig   acetaminophen (TYLENOL) 325 MG tablet Take 650 mg by mouth every 4 (four) hours as needed.   acetaminophen (TYLENOL) 500 MG tablet Take 1,000 mg by mouth every 8 (eight) hours as needed for mild pain or moderate pain.   albuterol (VENTOLIN HFA) 108 (90 Base) MCG/ACT inhaler Inhale 1 puff into the lungs every 4 (four) hours as needed for wheezing or shortness of breath.   alum & mag hydroxide-simeth (MAALOX/MYLANTA) 200-200-20 MG/5ML suspension Take 30 mLs by mouth every 4 (four) hours as needed for indigestion, heartburn or flatulence.   bismuth subsalicylate (PEPTO BISMOL) 262 MG/15ML suspension Give 10cc by mouth as needed for loose stool   calcium carbonate (TUMS - DOSED IN MG ELEMENTAL CALCIUM) 500 MG chewable tablet Chew 1 tablet by mouth every 6 (six) hours as needed for indigestion or heartburn.   carbamide peroxide (DEBROX) 6.5 % OTIC solution Place 5 drops into both ears as needed (for ear wax).  Cholecalciferol (VITAMIN D) 50 MCG (2000 UT) CAPS Take by mouth.   diclofenac Sodium (VOLTAREN) 1 % GEL Apply to lower back topically every 12 hours as needed for pain.   Glucose 15 GM/32ML GEL Give 1 packet by mouth as needed for low blood sugar.   hydrALAZINE (APRESOLINE) 50 MG tablet Take 1 tablet (50 mg total) by mouth 3 (three) times daily.   ibuprofen (ADVIL) 200 MG tablet Take 200 mg by mouth every 12 (twelve) hours as needed.   losartan (COZAAR) 100 MG tablet Take 100 mg by mouth daily.   metoprolol tartrate (LOPRESSOR) 100 MG tablet Take 1 tablet (100 mg total) by mouth 2  (two) times daily.   nystatin (MYCOSTATIN/NYSTOP) powder Apply 1 Application topically 2 (two) times daily.   omeprazole (PRILOSEC) 20 MG capsule Take 1 capsule (20 mg total) by mouth daily.   ondansetron (ZOFRAN) 4 MG tablet Take 2 tablets (8 mg total) by mouth every 8 (eight) hours as needed for nausea or vomiting.   pseudoephedrine-dextromethorphan-guaifenesin (ROBITUSSIN-PE) 30-10-100 MG/5ML solution Take 5 mLs by mouth every 4 (four) hours as needed for cough.   sennosides-docusate sodium (SENOKOT-S) 8.6-50 MG tablet Take 2 tablets by mouth at bedtime.   simvastatin (ZOCOR) 20 MG tablet Take 20 mg by mouth daily.   vitamin B-12 (CYANOCOBALAMIN) 1000 MCG tablet Take 1,000 mcg by mouth daily.   [DISCONTINUED] albuterol (VENTOLIN HFA) 108 (90 Base) MCG/ACT inhaler Inhale 1-2 puffs into the lungs every 6 (six) hours as needed for wheezing or shortness of breath. (Patient taking differently: Inhale 1-2 puffs into the lungs every 4 (four) hours as needed for wheezing or shortness of breath.)   [DISCONTINUED] aspirin EC 81 MG tablet Take 81 mg by mouth daily.   No facility-administered encounter medications on file as of 06/12/2022.    Review of Systems  Immunization History  Administered Date(s) Administered   Influenza-Unspecified 03/27/2022   Moderna Sars-Covid-2 Vaccination 07/13/2019, 08/12/2019, 11/06/2021   Pneumococcal Polysaccharide-23 11/17/2017   Tdap 12/25/2012   Zoster Recombinat (Shingrix) 09/20/2009   Pertinent  Health Maintenance Due  Topic Date Due   INFLUENZA VACCINE  Completed      02/13/2022   11:00 AM 02/13/2022    9:30 PM 02/14/2022   10:00 AM 02/14/2022    7:30 PM 02/15/2022    7:38 AM  Fall Risk  Patient Fall Risk Level High fall risk High fall risk High fall risk High fall risk High fall risk   Functional Status Survey:    Vitals:   06/12/22 1152  BP: (!) 130/56  Pulse: (!) 50  Resp: 16  Temp: (!) 97.4 F (36.3 C)  SpO2: 95%  Weight: 194 lb 3.2 oz (88.1 kg)   Height: 5\' 11"  (1.803 m)   Body mass index is 27.09 kg/m. Physical Exam  Labs reviewed: Recent Labs    02/11/22 0509 02/12/22 0708 02/13/22 0604 02/14/22 0400 02/15/22 0431  NA 142 141 141  --   --   K 3.3* 3.1* 3.6  --   --   CL 107 109 111  --   --   CO2 23 24 22   --   --   GLUCOSE 136* 123* 126*  --   --   BUN 34* 49* 52*  --   --   CREATININE 1.93* 2.06* 1.91*  --   --   CALCIUM 8.9 8.4* 8.2*  --   --   MG 2.4 2.6* 2.5* 2.6* 2.5*   Recent Labs  02/07/22 1345 02/09/22 0640  AST 28 51*  ALT 20 22  ALKPHOS 56 55  BILITOT 1.3* 1.6*  PROT 7.8 7.1  ALBUMIN 4.2 3.5   Recent Labs    02/07/22 1807 02/09/22 0640 02/11/22 0509 02/12/22 0708 02/13/22 0604  WBC 12.0*   < > 10.9* 8.3 5.9  NEUTROABS 10.0*  --   --   --   --   HGB 11.7*   < > 12.9* 12.8* 12.1*  HCT 35.9*   < > 39.5 39.5 37.4*  MCV 92.5   < > 90.2 90.4 90.6  PLT 147*   < > 215 207 211   < > = values in this interval not displayed.   Lab Results  Component Value Date   TSH 0.091 (L) 02/07/2022   Lab Results  Component Value Date   HGBA1C 6.2 (H) 02/09/2022   Lab Results  Component Value Date   CHOL 105 02/09/2022   HDL 39 (L) 02/09/2022   LDLCALC 55 02/09/2022   TRIG 56 02/09/2022   CHOLHDL 2.7 02/09/2022    Significant Diagnostic Results in last 30 days:  No results found.  Assessment/Plan 1. Primary hypertension BP well-controlled. Diastolic slightly low. No signs of ortho stasis. Range of blood pressure to the 150s/70s. Stable at this time. Continue hydralazine 50 mg TID. Continue losartan 100 mg daily. Continue Zocor 20 mg. Continue lopressor 100 mg BID.   2. Gastroesophageal reflux disease without esophagitis Patient without significant symptoms. Discussed discontining. Plan to give every other day for 2 weeks then discontinue. Monitor for symptoms.   3. Presbycusis of both ears Hearing aids in place  4. Mild cognitive impairment Patient remains in assisted living due to  changes in mobility and memory changes. Alert and oriented. Ambulatory and continent of urine.   5. Stage 3b chronic kidney disease ([place]) Most recent kidney function show stable CKD. Will order repeat labs and quarterly labs. Discussed concern for ibuprofen impact on kidney function. Declined discontinuing at this time.  Creatinine, Ser  Date/Time Value Ref Range Status  02/13/2022 06:04 AM 1.91 (H) 0.61 - 1.24 mg/dL Final   6. Chronic low back pain Continue PRN tylenol. Start lidocaine patches. Patient requests continued use of ibuprofen, will continue at this time, advised regarding risk of bleeding and progression of kidney function.     Family/ staff Communication: Rosann Auerbach, nursing  Labs/tests ordered:  BMPs quarterly.

## 2022-06-13 ENCOUNTER — Encounter: Payer: Self-pay | Admitting: Student

## 2022-06-13 DIAGNOSIS — G8929 Other chronic pain: Secondary | ICD-10-CM | POA: Insufficient documentation

## 2022-08-01 ENCOUNTER — Emergency Department
Admission: EM | Admit: 2022-08-01 | Discharge: 2022-08-01 | Disposition: A | Discharge status: Home / Self Care | Payer: Medicare Other | Attending: Emergency Medicine | Admitting: Emergency Medicine

## 2022-08-01 ENCOUNTER — Emergency Department: Payer: Medicare Other

## 2022-08-01 DIAGNOSIS — Z20822 Contact with and (suspected) exposure to covid-19: Secondary | ICD-10-CM | POA: Diagnosis not present

## 2022-08-01 DIAGNOSIS — R0789 Other chest pain: Secondary | ICD-10-CM | POA: Diagnosis not present

## 2022-08-01 DIAGNOSIS — R0602 Shortness of breath: Secondary | ICD-10-CM | POA: Insufficient documentation

## 2022-08-01 DIAGNOSIS — R7989 Other specified abnormal findings of blood chemistry: Secondary | ICD-10-CM | POA: Diagnosis not present

## 2022-08-01 DIAGNOSIS — R079 Chest pain, unspecified: Secondary | ICD-10-CM

## 2022-08-01 LAB — BASIC METABOLIC PANEL
Anion gap: 9 (ref 5–15)
BUN: 37 mg/dL — ABNORMAL HIGH (ref 8–23)
CO2: 22 mmol/L (ref 22–32)
Calcium: 8.9 mg/dL (ref 8.9–10.3)
Chloride: 109 mmol/L (ref 98–111)
Creatinine, Ser: 2.06 mg/dL — ABNORMAL HIGH (ref 0.61–1.24)
GFR, Estimated: 29 mL/min — ABNORMAL LOW (ref >60)
Glucose, Bld: 123 mg/dL — ABNORMAL HIGH (ref 70–99)
Potassium: 4 mmol/L (ref 3.5–5.1)
Sodium: 140 mmol/L (ref 135–145)

## 2022-08-01 LAB — RESP PANEL BY RT-PCR (RSV, FLU A&B, COVID)  RVPGX2
Influenza A by PCR: NEGATIVE
Influenza B by PCR: NEGATIVE
Resp Syncytial Virus by PCR: NEGATIVE
SARS Coronavirus 2 by RT PCR: NEGATIVE

## 2022-08-01 LAB — CBC
HCT: 41.2 % (ref 39.0–52.0)
Hemoglobin: 13.4 g/dL (ref 13.0–17.0)
MCH: 30.6 pg (ref 26.0–34.0)
MCHC: 32.5 g/dL (ref 30.0–36.0)
MCV: 94.1 fL (ref 80.0–100.0)
Platelets: 184 10*3/uL (ref 150–400)
RBC: 4.38 MIL/uL (ref 4.22–5.81)
RDW: 12.8 % (ref 11.5–15.5)
WBC: 6.9 10*3/uL (ref 4.0–10.5)
nRBC: 0 % (ref 0.0–0.2)

## 2022-08-01 LAB — TROPONIN I (HIGH SENSITIVITY)
Troponin I (High Sensitivity): 11 ng/L (ref <18)
Troponin I (High Sensitivity): 11 ng/L (ref <18)

## 2022-08-01 LAB — BRAIN NATRIURETIC PEPTIDE: B Natriuretic Peptide: 221.8 pg/mL — ABNORMAL HIGH (ref 0.0–100.0)

## 2022-08-01 MED ORDER — HYDRALAZINE HCL 20 MG/ML IJ SOLN
5.0000 mg | Freq: Once | INTRAMUSCULAR | Status: AC
  Administered 2022-08-01: 5 mg via INTRAVENOUS
  Filled 2022-08-01: qty 1

## 2022-08-01 MED ORDER — FUROSEMIDE 20 MG PO TABS: 20.0000 mg | ORAL_TABLET | Freq: Every day | ORAL | 0 refills | Status: DC

## 2022-08-01 NOTE — Discharge Instructions (Signed)
Please seek medical attention for any high fevers, chest pain, shortness of breath, change in behavior, persistent vomiting, bloody stool or any other new or concerning symptoms.  

## 2022-08-01 NOTE — ED Provider Notes (Signed)
[facility] Provider Note    Event Date/Time   First MD Initiated Contact with Patient 08/01/22 2022     (approximate)   History   Chest Pain   HPI  Frederick Simon is a 89 y.o. male who presents to the emergency department today because of concerns for left chest discomfort.  He states that it was never a significant pain.  It was located in his left chest.  Started today.  At the time my exam he says he still has some discomfort but not as bad as it was.  Did have some shortness of breath today.  Family states he has been having issues with some shortness of breath recently although it typically relates to him wearing a mask.  He did not wear a mask today.  He denies any fevers.  States he has noticed some swelling in his legs. No known sick contacts.      Physical Exam   Triage Vital Signs: ED Triage Vitals  Enc Vitals Group     BP 08/01/22 2017 (!) 237/81     Pulse Rate 08/01/22 2017 (!) 56     Resp 08/01/22 2017 12     Temp 08/01/22 2017 97.8 F (36.6 C)     Temp Source 08/01/22 2017 Oral     SpO2 08/01/22 2017 97 %     Weight 08/01/22 2013 160 lb (72.6 kg)     Height 08/01/22 2013 5' 10"$  (1.778 m)     Head Circumference --      Peak Flow --      Pain Score 08/01/22 2013 0     Pain Loc --      Pain Edu? --      Excl. in [place]? --     Most recent vital signs: Vitals:   08/01/22 2017  BP: (!) 237/81  Pulse: (!) 56  Resp: 12  Temp: 97.8 F (36.6 C)  SpO2: 97%   General: Awake, alert, oriented. CV:  Good peripheral perfusion. Bradycardia. Resp:  Normal effort. Lungs clear. Abd:  No distention.  Other:  Trace bilateral edema.   ED Results / Procedures / Treatments   Labs (all labs ordered are listed, but only abnormal results are displayed) Labs Reviewed  BASIC METABOLIC PANEL - Abnormal; Notable for the following components:      Result Value   Glucose, Bld 123 (*)    BUN 37 (*)    Creatinine, Ser 2.06 (*)    GFR, Estimated 29  (*)    All other components within normal limits  BRAIN NATRIURETIC PEPTIDE - Abnormal; Notable for the following components:   B Natriuretic Peptide 221.8 (*)    All other components within normal limits  RESP PANEL BY RT-PCR (RSV, FLU A&B, COVID)  RVPGX2  CBC  TROPONIN I (HIGH SENSITIVITY)  TROPONIN I (HIGH SENSITIVITY)     EKG  I, Nance Pear, attending physician, personally viewed and interpreted this EKG  EKG Time: 2013 Rate: 61 Rhythm: sinus rhythm Axis: normal Intervals: qtc 487 QRS: narrow ST changes: no st elevation Impression: normal ekg   RADIOLOGY I independently interpreted and visualized the CXR. My interpretation: No pneumonia Radiology interpretation:  IMPRESSION:  Chronic appearing increased interstitial lung markings with mild  bibasilar atelectasis.      PROCEDURES:  Critical Care performed: No  Procedures   MEDICATIONS ORDERED IN ED: Medications  hydrALAZINE (APRESOLINE) injection 5 mg (5 mg Intravenous Given 08/01/22 2047)  IMPRESSION / MDM / ASSESSMENT AND PLAN / ED COURSE  I reviewed the triage vital signs and the nursing notes.                              Differential diagnosis includes, but is not limited to, ACS, pneumonia, PE, pneumothorax, esophagitis , costochondritis  Patient's presentation is most consistent with acute presentation with potential threat to life or bodily function.  Patient presented to the emergency department today because of concerns for chest pain.  In addition he had some complaints of shortness of breath.  Troponin was negative.  Chest x-ray without concerning pneumonia.  COVID RSV and flu negative.  No significant anemia.  No concerning leukocytosis.  Did check a BNP which was slightly elevated.  While this would not necessarily explain the chest pain could explain some of the shortness of breath.  Discussed this with the patient and daughter.  Do think is reasonable for patient to be discharged  home at this time.  FINAL CLINICAL IMPRESSION(S) / ED DIAGNOSES   Final diagnoses:  Nonspecific chest pain  Elevated brain natriuretic peptide (BNP) level     Note:  This document was prepared using Dragon voice recognition software and may include unintentional dictation errors.    Nance Pear, MD 08/01/22 [id number]

## 2022-08-01 NOTE — ED Triage Notes (Signed)
Pt bib EMS from [facility] c/o L sided cp he rated 1 out of 10.  324 aspirin and .4 subling nitro given en route. EMS found pt to be hypertensive with systolic in the [id number]. Pt states cp relieved after nitro.

## 2022-08-02 ENCOUNTER — Telehealth: Payer: Self-pay | Admitting: Emergency Medicine

## 2022-08-02 MED ORDER — FUROSEMIDE 20 MG PO TABS: 20.0000 mg | ORAL_TABLET | Freq: Every day | ORAL | 0 refills | Status: DC

## 2022-08-02 NOTE — Telephone Encounter (Signed)
Patient's nursing facility is called stating that the prescription was sent to the wrong pharmacy.  They would like the prescription sent to [facility].  I have changed the prescription of 3 days of Lasix to this pharmacy.

## 2022-08-09 ENCOUNTER — Encounter: Payer: Self-pay | Admitting: Student

## 2022-08-09 ENCOUNTER — Non-Acute Institutional Stay: Payer: Medicare Other | Admitting: Student

## 2022-08-09 DIAGNOSIS — K219 Gastro-esophageal reflux disease without esophagitis: Secondary | ICD-10-CM

## 2022-08-09 DIAGNOSIS — I10 Essential (primary) hypertension: Secondary | ICD-10-CM

## 2022-08-09 DIAGNOSIS — N1832 Chronic kidney disease, stage 3b: Secondary | ICD-10-CM | POA: Diagnosis not present

## 2022-08-09 DIAGNOSIS — R7989 Other specified abnormal findings of blood chemistry: Secondary | ICD-10-CM

## 2022-08-09 DIAGNOSIS — E7800 Pure hypercholesterolemia, unspecified: Secondary | ICD-10-CM | POA: Diagnosis not present

## 2022-08-09 NOTE — Progress Notes (Signed)
Location:  Other [place].  Nursing Home Room Number: Gabriel Carina [id number] Place of Service:  ALF [phone]) Provider:  Dewayne Shorter, MD  Patient Care Team: Dewayne Shorter, MD as PCP - General (Family Medicine) Lauree Chandler, NP as Nurse Practitioner (Geriatric Medicine) Wellington Hampshire, MD as Consulting Physician (Cardiology)  Extended Emergency Contact Information Primary Emergency Contact: [place] Mobile Phone: [phone] Relation: Daughter Secondary Emergency Contact: [place] Phone: [phone] Mobile Phone: [phone] Relation: Daughter  Code Status:  DNR Goals of care: Advanced Directive information    08/09/2022    1:54 PM  Advanced Directives  Does Patient Have a Medical Advance Directive? Yes  Type of Advance Directive Out of facility DNR (pink MOST or yellow form)  Does patient want to make changes to medical advance directive? No - Patient declined     Chief Complaint  Patient presents with  . Hospitalization Follow-up    ER Follow up    HPI:  Pt is a 89 y.o. male seen today for an acute visit for   Spoke with his daughter who says he said he felt his chest with each heart beat. The next day he described it as an elephant on his chest. Unclear if  They don't want to do anything invasive, but are okay with medication   They wan tto do medical management. His kidney function is poor. He seems comfortable, and that's what we want to continue. Do not want invasive interventions either.   Past Medical History:  Diagnosis Date  . Acute hypoxemic respiratory failure ([place]) 02/10/2022  . AKI (acute kidney injury) ([place]) 02/09/2022  . Allergic rhinitis   . CAP (community acquired pneumonia) 02/09/2022  . Gastroesophageal reflux disease   . Hyperlipidemia   . Hypertension   . Nausea & vomiting 02/11/2022  . Severe sepsis ([place]) 02/10/2022   Past Surgical History:  Procedure Laterality Date  . BACK SURGERY  May 1975    Dr.  Collier Salina  . BACK SURGERY  Feb 2004   Dr. Sherwood Gambler   . HAND SURGERY  2008,2010   Dr. Telford Nab  . NOSE SURGERY  1989   Dr. Purcell Nails  . SHOULDER SURGERY  2008,2009   Dr. Telford Nab    Allergies  Allergen Reactions  . Dust Mite Extract Other (See Comments)    sneeze  . Wool Alcohol [Lanolin] Itching    Outpatient Encounter Medications as of 08/09/2022  Medication Sig  . acetaminophen (TYLENOL) 325 MG tablet Take 650 mg by mouth every 4 (four) hours as needed.  Marland Kitchen acetaminophen (TYLENOL) 500 MG tablet Take 1,000 mg by mouth every 8 (eight) hours as needed for mild pain or moderate pain.  Marland Kitchen albuterol (VENTOLIN HFA) 108 (90 Base) MCG/ACT inhaler Inhale 1 puff into the lungs every 4 (four) hours as needed for wheezing or shortness of breath.  Marland Kitchen alum & mag hydroxide-simeth (MAALOX/MYLANTA) 200-200-20 MG/5ML suspension Take 30 mLs by mouth every 4 (four) hours as needed for indigestion, heartburn or flatulence.  . bismuth subsalicylate (PEPTO BISMOL) 262 MG/15ML suspension Give 10cc by mouth as needed for loose stool  . calcium carbonate (TUMS - DOSED IN MG ELEMENTAL CALCIUM) 500 MG chewable tablet Chew 1 tablet by mouth every 6 (six) hours as needed for indigestion or heartburn.  . carbamide peroxide (DEBROX) 6.5 % OTIC solution Place 5 drops into both ears as needed (for ear wax).  . Cholecalciferol (VITAMIN D) 50 MCG (2000 UT) CAPS Take by mouth.  . diclofenac Sodium (VOLTAREN) 1 %  GEL Apply to lower back topically every 12 hours as needed for pain.  . Glucose 15 GM/32ML GEL Give 1 packet by mouth as needed for low blood sugar.  . hydrALAZINE (APRESOLINE) 50 MG tablet Take 1 tablet (50 mg total) by mouth 3 (three) times daily.  Marland Kitchen ibuprofen (ADVIL) 200 MG tablet Take 200 mg by mouth every 12 (twelve) hours as needed.  . lidocaine (LIDODERM) 5 % Place 1 patch onto the skin daily. Remove & Discard patch within 12 hours or as directed by MD  . losartan (COZAAR) 100 MG tablet Take 100 mg by mouth  daily.  . magnesium hydroxide (MILK OF MAGNESIA) 400 MG/5ML suspension Take 30 mLs by mouth daily as needed for mild constipation.  . metoprolol tartrate (LOPRESSOR) 100 MG tablet Take 1 tablet (100 mg total) by mouth 2 (two) times daily.  Marland Kitchen nystatin (MYCOSTATIN/NYSTOP) powder Apply 1 Application topically 2 (two) times daily.  . ondansetron (ZOFRAN) 4 MG tablet Take 2 tablets (8 mg total) by mouth every 8 (eight) hours as needed for nausea or vomiting.  . OXYGEN 2lmp for dyspnea or SOB  . polyethylene glycol (MIRALAX / GLYCOLAX) 17 g packet Take 17 g by mouth daily as needed.  . pseudoephedrine-dextromethorphan-guaifenesin (ROBITUSSIN-PE) 30-10-100 MG/5ML solution Take 5 mLs by mouth every 4 (four) hours as needed for cough.  . sennosides-docusate sodium (SENOKOT-S) 8.6-50 MG tablet Take 2 tablets by mouth at bedtime.  . simvastatin (ZOCOR) 20 MG tablet Take 20 mg by mouth daily.  . vitamin B-12 (CYANOCOBALAMIN) 1000 MCG tablet Take 1,000 mcg by mouth daily.  Marland Kitchen omeprazole (PRILOSEC) 20 MG capsule Take 1 capsule (20 mg total) by mouth daily.  . [DISCONTINUED] furosemide (LASIX) 20 MG tablet Take 1 tablet (20 mg total) by mouth daily.   No facility-administered encounter medications on file as of 08/09/2022.    Review of Systems  Immunization History  Administered Date(s) Administered  . Influenza-Unspecified 03/27/2021, 03/27/2022  . Moderna Sars-Covid-2 Vaccination 07/13/2019, 08/12/2019, 11/06/2021  . Pneumococcal Polysaccharide-23 11/17/2017  . Tdap 12/25/2012  . Unspecified SARS-COV-2 Vaccination 04/02/2020, 10/27/2020, 03/02/2021  . Zoster Recombinat (Shingrix) 09/20/2009   Pertinent  Health Maintenance Due  Topic Date Due  . INFLUENZA VACCINE  Completed      02/13/2022   11:00 AM 02/13/2022    9:30 PM 02/14/2022   10:00 AM 02/14/2022    7:30 PM 02/15/2022    7:38 AM  Fall Risk  (RETIRED) Patient Fall Risk Level High fall risk High fall risk High fall risk High fall risk High fall  risk   Functional Status Survey:    Vitals:   08/09/22 1329  BP: 128/65  Pulse: (!) 56  Resp: 16  Temp: (!) 97.3 F (36.3 C)  SpO2: 96%  Weight: 196 lb 6.4 oz (89.1 kg)  Height: '5\' 10"'$  (1.778 m)   Body mass index is 28.18 kg/m. Physical Exam  Labs reviewed: Recent Labs    02/12/22 0708 02/13/22 0604 02/14/22 0400 02/15/22 0431 08/01/22 2022  NA 141 141  --   --  140  K 3.1* 3.6  --   --  4.0  CL 109 111  --   --  109  CO2 24 22  --   --  22  GLUCOSE 123* 126*  --   --  123*  BUN 49* 52*  --   --  37*  CREATININE 2.06* 1.91*  --   --  2.06*  CALCIUM 8.4* 8.2*  --   --  8.9  MG 2.6* 2.5* 2.6* 2.5*  --    Recent Labs    02/07/22 1345 02/09/22 0640  AST 28 51*  ALT 20 22  ALKPHOS 56 55  BILITOT 1.3* 1.6*  PROT 7.8 7.1  ALBUMIN 4.2 3.5   Recent Labs    02/07/22 1807 02/09/22 0640 02/12/22 0708 02/13/22 0604 08/01/22 2022  WBC 12.0*   < > 8.3 5.9 6.9  NEUTROABS 10.0*  --   --   --   --   HGB 11.7*   < > 12.8* 12.1* 13.4  HCT 35.9*   < > 39.5 37.4* 41.2  MCV 92.5   < > 90.4 90.6 94.1  PLT 147*   < > 207 211 184   < > = values in this interval not displayed.   Lab Results  Component Value Date   TSH 0.091 (L) 02/07/2022   Lab Results  Component Value Date   HGBA1C 6.2 (H) 02/09/2022   Lab Results  Component Value Date   CHOL 105 02/09/2022   HDL 39 (L) 02/09/2022   LDLCALC 55 02/09/2022   TRIG 56 02/09/2022   CHOLHDL 2.7 02/09/2022    Significant Diagnostic Results in last 30 days:  DG Chest 2 View  Result Date: 08/01/2022 CLINICAL DATA:  Left-sided chest pain. EXAM: CHEST - 2 VIEW COMPARISON:  February 10, 2022 FINDINGS: The heart size and mediastinal contours are within normal limits. Mild, diffuse, chronic appearing increased interstitial lung markings are seen. Mild atelectasis is noted within the bilateral lung bases. There is no evidence of a pleural effusion or pneumothorax. Multilevel degenerative changes seen throughout the thoracic  spine. IMPRESSION: Chronic appearing increased interstitial lung markings with mild bibasilar atelectasis. Electronically Signed   By: Virgina Norfolk M.D.   On: 08/01/2022 20:50    Assessment/Plan There are no diagnoses linked to this encounter.   Family/ staff Communication: ***  Labs/tests ordered:  ***

## 2022-08-12 LAB — CBC AND DIFFERENTIAL
HCT: 39 — AB (ref 41–53)
Hemoglobin: 13 — AB (ref 13.5–17.5)
Neutrophils Absolute: 2440
Platelets: 172 10*3/uL (ref 150–400)
WBC: 5.7

## 2022-08-12 LAB — BASIC METABOLIC PANEL
BUN: 33 — AB (ref 4–21)
CO2: 26 — AB (ref 13–22)
Chloride: 109 — AB (ref 99–108)
Creatinine: 1.9 — AB (ref 0.6–1.3)
Glucose: 84
Potassium: 4.2 mEq/L (ref 3.5–5.1)
Sodium: 143 (ref 137–147)

## 2022-08-12 LAB — LIPID PANEL
Cholesterol: 151 (ref 0–200)
HDL: 39 (ref 35–70)
LDL Cholesterol: 94
Triglycerides: 87 (ref 40–160)

## 2022-08-12 LAB — COMPREHENSIVE METABOLIC PANEL
Albumin: 3.9 (ref 3.5–5.0)
Calcium: 8.6 — AB (ref 8.7–10.7)
Globulin: 2.2
eGFR: 32

## 2022-08-12 LAB — HEPATIC FUNCTION PANEL
ALT: 15 U/L (ref 10–40)
AST: 16 (ref 14–40)
Alkaline Phosphatase: 58 (ref 25–125)
Bilirubin, Total: 0.8

## 2022-08-12 LAB — CBC: RBC: 4.27 (ref 3.87–5.11)

## 2022-08-28 ENCOUNTER — Encounter: Payer: Self-pay | Admitting: Student

## 2022-08-28 ENCOUNTER — Non-Acute Institutional Stay: Payer: Medicare Other | Admitting: Student

## 2022-08-28 DIAGNOSIS — N1832 Chronic kidney disease, stage 3b: Secondary | ICD-10-CM

## 2022-08-28 DIAGNOSIS — I10 Essential (primary) hypertension: Secondary | ICD-10-CM

## 2022-08-28 DIAGNOSIS — R7989 Other specified abnormal findings of blood chemistry: Secondary | ICD-10-CM

## 2022-08-28 NOTE — Progress Notes (Addendum)
Location:  Other [facility].  Nursing Home Room Number: Gabriel Carina [id number] Place of Service:  ALF [phone]) Provider:  Dewayne Shorter, MD  Patient Care Team: Dewayne Shorter, MD as PCP - General (Family Medicine) Lauree Chandler, NP as Nurse Practitioner (Geriatric Medicine) Wellington Hampshire, MD as Consulting Physician (Cardiology)  Extended Emergency Contact Information Primary Emergency Contact: [place] Mobile Phone: [phone] Relation: Daughter Secondary Emergency Contact: [place] Phone: [phone] Mobile Phone: [phone] Relation: Daughter  Code Status:  DNR Goals of care: Advanced Directive information    08/28/2022   10:06 AM  Advanced Directives  Does Patient Have a Medical Advance Directive? Yes  Type of Advance Directive Out of facility DNR (pink MOST or yellow form)  Does patient want to make changes to medical advance directive? No - Patient declined     Chief Complaint  Patient presents with   Medical Management of Chronic Issues    Medical Management of Chronic Issues.     HPI:  Pt is a 89 y.o. male seen today for medical management of chronic diseases.   Patient left campus for a field trip. Unable to perform physical exam.   Discussed patient's most recent echocardiogram which stated  "SIGNIFICANT FINDINGS Echocardiogram-Complete: . LEFT VENTRICLE: Not seen well(NSW) Mildly enlarged LVEF est 40% RIGHT VENTRICLE: Normal LEFT ATRIUM: Normal size RIGHT ATRIUM: Normal size MITRAL VALVE: [place] no significant insufficiency AORTIC VALVE: No stenosis TRICUSPID VALVE: Mild insufficiency PULMONARY VALVE: [place] AORTIC ROOT: [place] PERICARDIUM: [place] . IMPRESSION: LEFT VENTRICLE: Not seen well(NSW) Mildly enlarged LVEF est 40% Suboptimal study due to poor windows TRICUSPID VALVE: Mild insufficiency"  Discussed how these findings show decreased function, however, poor views. Difficult to say that there is definitive decline as patient  has no symptoms of worsening cardiac function   Past Medical History:  Diagnosis Date   Acute hypoxemic respiratory failure ([place]) 02/10/2022   AKI (acute kidney injury) ([place]) 02/09/2022   Allergic rhinitis    CAP (community acquired pneumonia) 02/09/2022   Gastroesophageal reflux disease    Hyperlipidemia    Hypertension    Nausea & vomiting 02/11/2022   Severe sepsis ([place]) 02/10/2022   Past Surgical History:  Procedure Laterality Date   BACK SURGERY  May 1975    Dr. Dairl Ponder SURGERY  Feb 2004   Dr. Sherwood Gambler    HAND SURGERY  [phone]   Dr. Telford Nab   NOSE SURGERY  1989   Dr. Wylene Men SURGERY  [phone]   Dr. Telford Nab    Allergies  Allergen Reactions   Dust Mite Extract Other (See Comments)    sneeze   Wool Alcohol [Lanolin] Itching    Outpatient Encounter Medications as of 08/28/2022  Medication Sig   acetaminophen (TYLENOL) 325 MG tablet Take 650 mg by mouth every 4 (four) hours as needed.   acetaminophen (TYLENOL) 500 MG tablet Take 1,000 mg by mouth every 8 (eight) hours as needed for mild pain or moderate pain.   albuterol (VENTOLIN HFA) 108 (90 Base) MCG/ACT inhaler Inhale 1 puff into the lungs every 4 (four) hours as needed for wheezing or shortness of breath.   alum & mag hydroxide-simeth (MAALOX/MYLANTA) 200-200-20 MG/5ML suspension Take 30 mLs by mouth every 4 (four) hours as needed for indigestion, heartburn or flatulence.   bismuth subsalicylate (PEPTO BISMOL) 262 MG/15ML suspension Give 10cc by mouth as needed for loose stool   calcium carbonate (TUMS - DOSED IN MG ELEMENTAL CALCIUM) 500 MG chewable tablet Chew 1  tablet by mouth every 6 (six) hours as needed for indigestion or heartburn.   carbamide peroxide (DEBROX) 6.5 % OTIC solution Place 5 drops into both ears as needed (for ear wax).   Cholecalciferol (VITAMIN D) 50 MCG (2000 UT) CAPS Give one capsule by mouth once daily.   diclofenac Sodium (VOLTAREN) 1 % GEL Apply to lower back  topically every 12 hours as needed for pain.   Glucose 15 GM/32ML GEL Give 1 packet by mouth as needed for low blood sugar.   hydrALAZINE (APRESOLINE) 50 MG tablet Take 1 tablet (50 mg total) by mouth 3 (three) times daily.   ibuprofen (ADVIL) 200 MG tablet Take 200 mg by mouth every 12 (twelve) hours as needed.   lidocaine (LIDODERM) 5 % Place 1 patch onto the skin daily. Remove & Discard patch within 12 hours or as directed by MD   losartan (COZAAR) 100 MG tablet Take 100 mg by mouth daily.   magnesium hydroxide (MILK OF MAGNESIA) 400 MG/5ML suspension Take 30 mLs by mouth daily as needed for mild constipation.   metoprolol tartrate (LOPRESSOR) 100 MG tablet Take 1 tablet (100 mg total) by mouth 2 (two) times daily.   nystatin (MYCOSTATIN/NYSTOP) powder Apply 1 Application topically 2 (two) times daily.   ondansetron (ZOFRAN) 4 MG tablet Take 2 tablets (8 mg total) by mouth every 8 (eight) hours as needed for nausea or vomiting.   OXYGEN 2lmp for dyspnea or SOB   polyethylene glycol (MIRALAX / GLYCOLAX) 17 g packet Take 17 g by mouth daily as needed.   pseudoephedrine-dextromethorphan-guaifenesin (ROBITUSSIN-PE) 30-10-100 MG/5ML solution Take 5 mLs by mouth every 4 (four) hours as needed for cough.   sennosides-docusate sodium (SENOKOT-S) 8.6-50 MG tablet Take 2 tablets by mouth at bedtime.   simvastatin (ZOCOR) 20 MG tablet Take 20 mg by mouth daily.   vitamin B-12 (CYANOCOBALAMIN) 1000 MCG tablet Take 1,000 mcg by mouth daily.   [DISCONTINUED] omeprazole (PRILOSEC) 20 MG capsule Take 1 capsule (20 mg total) by mouth daily.   No facility-administered encounter medications on file as of 08/28/2022.    Review of Systems  Immunization History  Administered Date(s) Administered   Influenza-Unspecified 03/27/2021, 03/27/2022   Moderna Sars-Covid-2 Vaccination 07/13/2019, 08/12/2019, 11/06/2021, 04/19/2022   Pneumococcal Polysaccharide-23 11/17/2017   Tdap 12/25/2012   Unspecified SARS-COV-2  Vaccination 04/02/2020, 10/27/2020, 03/02/2021   Zoster Recombinat (Shingrix) 09/20/2009   Pertinent  Health Maintenance Due  Topic Date Due   INFLUENZA VACCINE  Completed      02/13/2022   11:00 AM 02/13/2022    9:30 PM 02/14/2022   10:00 AM 02/14/2022    7:30 PM 02/15/2022    7:38 AM  Fall Risk  (RETIRED) Patient Fall Risk Level High fall risk High fall risk High fall risk High fall risk High fall risk   Functional Status Survey:    Vitals:   08/28/22 0939 08/28/22 1007  BP: (!) 146/64 (!) 169/77  Pulse: (!) 51   Resp: 16   Temp: 98 F (36.7 C)   SpO2: 92%   Weight: 198 lb 3.2 oz (89.9 kg)   Height: 5\' 10"  (1.778 m)    Body mass index is 28.44 kg/m. Physical Exam Vitals reviewed: Unable to perform physical exam..     Labs reviewed: Recent Labs    02/12/22 0708 02/13/22 0604 02/14/22 0400 02/15/22 0431 08/01/22 2022 08/12/22 0000  NA 141 141  --   --  140 143  K 3.1* 3.6  --   --  4.0 4.2  CL 109 111  --   --  109 109*  CO2 24 22  --   --  22 26*  GLUCOSE 123* 126*  --   --  123*  --   BUN 49* 52*  --   --  37* 33*  CREATININE 2.06* 1.91*  --   --  2.06* 1.9*  CALCIUM 8.4* 8.2*  --   --  8.9 8.6*  MG 2.6* 2.5* 2.6* 2.5*  --   --    Recent Labs    02/07/22 1345 02/09/22 0640 08/12/22 0000  AST 28 51* 16  ALT 20 22 15   ALKPHOS 56 55 58  BILITOT 1.3* 1.6*  --   PROT 7.8 7.1  --   ALBUMIN 4.2 3.5 3.9   Recent Labs    10/15/21 0000 02/07/22 1345 02/07/22 1807 02/09/22 0640 02/12/22 0708 02/13/22 0604 08/01/22 2022 08/12/22 0000  WBC 7.2   < > 12.0*   < > 8.3 5.9 6.9 5.7  NEUTROABS 3,377.00  --  10.0*  --   --   --   --  2,440.00  HGB 14.5   < > 11.7*   < > 12.8* 12.1* 13.4 13.0*  HCT 43   < > 35.9*   < > 39.5 37.4* 41.2 39*  MCV  --    < > 92.5   < > 90.4 90.6 94.1  --   PLT 195   < > 147*   < > 207 211 184 172   < > = values in this interval not displayed.   Lab Results  Component Value Date   TSH 0.091 (L) 02/07/2022   Lab Results   Component Value Date   HGBA1C 6.2 (H) 02/09/2022   Lab Results  Component Value Date   CHOL 151 08/12/2022   HDL 39 08/12/2022   LDLCALC 94 08/12/2022   TRIG 87 08/12/2022   CHOLHDL 2.7 02/09/2022    Significant Diagnostic Results in last 30 days:  DG Chest 2 View  Result Date: 08/01/2022 CLINICAL DATA:  Left-sided chest pain. EXAM: CHEST - 2 VIEW COMPARISON:  February 10, 2022 FINDINGS: The heart size and mediastinal contours are within normal limits. Mild, diffuse, chronic appearing increased interstitial lung markings are seen. Mild atelectasis is noted within the bilateral lung bases. There is no evidence of a pleural effusion or pneumothorax. Multilevel degenerative changes seen throughout the thoracic spine. IMPRESSION: Chronic appearing increased interstitial lung markings with mild bibasilar atelectasis. Electronically Signed   By: Virgina Norfolk M.D.   On: 08/01/2022 20:50    Assessment/Plan Elevated brain natriuretic peptide (BNP) level  Stage 3b chronic kidney disease ([place])  Primary hypertension Repeat echocardiogram was ordered due to elevated BNP at recent ED Visit. Based on previous hospital-based echocardiogram, patient does not have history of heart failure. Concern for low HR on current dosing of metoprolol. Will plan to transition patient to the following regimen over the next few weeks for blood pressure.  Decrease dose of metoprolol by half every 3 days until discontinued in 10 days Decrease dose of hydralazine by half every 5 days until discontinued 15 days Start amlodipine 5 mg daily once no longer on metoprolol.  Continue losartan 100 mg daily.    Family/ staff Communication: nursing   Labs/tests ordered:  BMP in 2 weeks.   This was a telephonic encounter. I spent >15 minutes discussing patient's current care plan.   Tomasa Rand, MD, [place]  [phone]    

## 2022-09-24 ENCOUNTER — Non-Acute Institutional Stay: Payer: Medicare Other | Admitting: Nurse Practitioner

## 2022-09-24 ENCOUNTER — Encounter: Payer: Self-pay | Admitting: Nurse Practitioner

## 2022-09-24 DIAGNOSIS — M545 Low back pain, unspecified: Secondary | ICD-10-CM

## 2022-09-24 DIAGNOSIS — I10 Essential (primary) hypertension: Secondary | ICD-10-CM | POA: Diagnosis not present

## 2022-09-24 DIAGNOSIS — H9113 Presbycusis, bilateral: Secondary | ICD-10-CM

## 2022-09-24 DIAGNOSIS — R7303 Prediabetes: Secondary | ICD-10-CM

## 2022-09-24 DIAGNOSIS — N1832 Chronic kidney disease, stage 3b: Secondary | ICD-10-CM

## 2022-09-24 DIAGNOSIS — E7800 Pure hypercholesterolemia, unspecified: Secondary | ICD-10-CM

## 2022-09-24 DIAGNOSIS — R7989 Other specified abnormal findings of blood chemistry: Secondary | ICD-10-CM

## 2022-09-24 DIAGNOSIS — K219 Gastro-esophageal reflux disease without esophagitis: Secondary | ICD-10-CM

## 2022-09-24 DIAGNOSIS — G8929 Other chronic pain: Secondary | ICD-10-CM

## 2022-09-24 DIAGNOSIS — K5901 Slow transit constipation: Secondary | ICD-10-CM

## 2022-09-24 DIAGNOSIS — G3184 Mild cognitive impairment, so stated: Secondary | ICD-10-CM

## 2022-09-24 NOTE — Progress Notes (Signed)
Location:  Other [facility].  Nursing Home Room Number: V[address] Son [address] Place of Service:  ALF [phone]) Abbey Chatters, NP  PCP: Earnestine Mealing, MD  Patient Care Team: Earnestine Mealing, MD as PCP - General (Family Medicine) Sharon Seller, NP as Nurse Practitioner (Geriatric Medicine) Iran Ouch, MD as Consulting Physician (Cardiology)  Extended Emergency Contact Information Primary Emergency Contact: Amanda [place] Home Phone: [phone] Mobile Phone: [phone] Relation: Daughter Secondary Emergency Contact: Waldon Merl [place] Mobile Phone: [phone] Relation: Daughter  Goals of care: Advanced Directive information    09/24/2022   11:27 AM  Advanced Directives  Does Patient Have a Medical Advance Directive? Yes  Type of Advance Directive Out of facility DNR (pink MOST or yellow form)  Does patient want to make changes to medical advance directive? No - Patient declined     Chief Complaint  Patient presents with   Medical Management of Chronic Issues    Medical Management of Chronic Issues.     HPI:  Pt is a 89 y.o. male seen today for medical management of chronic disease. Pt with hx of HTN, mild memory impairment, hyperlipidemia, CKD, GERD, chronic back pain.  [facility] a [facility] at [facility] [place].  Went to the hospital with chest pains in February. Denies any further chest pains. Reports he occasionally feels like he can not catch his breath but this is not the norm and reports rarely happens.  Blood pressures have been labile. No dizziness or lightheadeness.   He has had chronic low back pain with 2 prior surgeries. Reports he is stiff/sore in the morning but walks it out. He will take a tylenol or aleve if needed for pain. Reports back is more uncomfortable.   No GERD  Continues on zocor for hyperlipidemia   Past Medical History:  Diagnosis Date   Acute hypoxemic respiratory failure  02/10/2022   AKI (acute kidney injury) 02/09/2022   Allergic rhinitis    CAP (community acquired pneumonia) 02/09/2022   Gastroesophageal reflux disease    Hyperlipidemia    Hypertension    Nausea & vomiting 02/11/2022   Severe sepsis 02/10/2022   Past Surgical History:  Procedure Laterality Date   BACK SURGERY  May 1975    Dr. Hilton Cork SURGERY  Feb 2004   Dr. Newell Coral    HAND SURGERY  [phone]   Dr. Priscille Kluver   NOSE SURGERY  1989   Dr. Ned Grace SURGERY  [phone]   Dr. Priscille Kluver    Allergies  Allergen Reactions   Dust Mite Extract Other (See Comments)    sneeze   Wool Alcohol [Lanolin] Itching    Outpatient Encounter Medications as of 09/24/2022  Medication Sig   acetaminophen (TYLENOL) 325 MG tablet Take 650 mg by mouth every 4 (four) hours as needed.   acetaminophen (TYLENOL) 500 MG tablet Take 1,000 mg by mouth every 8 (eight) hours as needed for mild pain or moderate pain.   albuterol (VENTOLIN HFA) 108 (90 Base) MCG/ACT inhaler Inhale 1 puff into the lungs every 4 (four) hours as needed for wheezing or shortness of breath.   alum & mag hydroxide-simeth (MAALOX/MYLANTA) 200-200-20 MG/5ML suspension Take 30 mLs by mouth every 4 (four) hours as needed for indigestion, heartburn or flatulence.   amLODipine (NORVASC) 5 MG tablet Take 5 mg by mouth daily.   bismuth subsalicylate (PEPTO BISMOL) 262 MG/15ML suspension Give 10cc by mouth as needed for loose  stool   calcium carbonate (TUMS - DOSED IN MG ELEMENTAL CALCIUM) 500 MG chewable tablet Chew 1 tablet by mouth every 6 (six) hours as needed for indigestion or heartburn.   carbamide peroxide (DEBROX) 6.5 % OTIC solution Place 5 drops into both ears as needed (for ear wax).   Cholecalciferol (VITAMIN D) 50 MCG (2000 UT) CAPS Give one capsule by mouth once daily.   diclofenac Sodium (VOLTAREN) 1 % GEL Apply to lower back topically every 12 hours as needed for pain.   Glucose 15 GM/32ML GEL Give 1 packet by  mouth as needed for low blood sugar.   ibuprofen (ADVIL) 200 MG tablet Take 200 mg by mouth every 12 (twelve) hours as needed.   lidocaine 4 % Place 1 patch onto the skin. Apply to lower back topically as needed for pain, remove after 12 hrs.   losartan (COZAAR) 100 MG tablet Take 100 mg by mouth daily.   magnesium hydroxide (MILK OF MAGNESIA) 400 MG/5ML suspension Take 30 mLs by mouth daily as needed for mild constipation.   nystatin (MYCOSTATIN/NYSTOP) powder Apply 1 Application topically 2 (two) times daily.   ondansetron (ZOFRAN) 4 MG tablet Take 2 tablets (8 mg total) by mouth every 8 (eight) hours as needed for nausea or vomiting.   OXYGEN 2lmp for dyspnea or SOB   polyethylene glycol (MIRALAX / GLYCOLAX) 17 g packet Take 17 g by mouth daily as needed.   pseudoephedrine-dextromethorphan-guaifenesin (ROBITUSSIN-PE) 30-10-100 MG/5ML solution Take 5 mLs by mouth every 4 (four) hours as needed for cough.   sennosides-docusate sodium (SENOKOT-S) 8.6-50 MG tablet Take 2 tablets by mouth at bedtime.   simvastatin (ZOCOR) 20 MG tablet Take 20 mg by mouth daily.   vitamin B-12 (CYANOCOBALAMIN) 1000 MCG tablet Take 1,000 mcg by mouth daily.   [DISCONTINUED] hydrALAZINE (APRESOLINE) 50 MG tablet Take 1 tablet (50 mg total) by mouth 3 (three) times daily.   [DISCONTINUED] lidocaine (LIDODERM) 5 % Place 1 patch onto the skin daily. Remove & Discard patch within 12 hours or as directed by MD   [DISCONTINUED] metoprolol tartrate (LOPRESSOR) 100 MG tablet Take 1 tablet (100 mg total) by mouth 2 (two) times daily.   No facility-administered encounter medications on file as of 09/24/2022.    Review of Systems  Constitutional:  Negative for activity change, appetite change, fatigue and unexpected weight change.  HENT:  Negative for congestion and hearing loss.   Eyes: Negative.   Respiratory:  Negative for cough and shortness of breath.   Cardiovascular:  Negative for chest pain, palpitations and leg  swelling.  Gastrointestinal:  Negative for abdominal pain, constipation and diarrhea.  Genitourinary:  Negative for difficulty urinating and dysuria.  Musculoskeletal:  Positive for back pain. Negative for arthralgias and myalgias.  Skin:  Negative for color change and wound.  Neurological:  Positive for weakness. Negative for dizziness.  Psychiatric/Behavioral:  Negative for agitation, behavioral problems and confusion.      Immunization History  Administered Date(s) Administered   Influenza-Unspecified 03/27/2021, 03/27/2022   Moderna Sars-Covid-2 Vaccination 07/13/2019, 08/12/2019, 11/06/2021, 04/19/2022   Pneumococcal Polysaccharide-23 11/17/2017   Tdap 12/25/2012   Unspecified SARS-COV-2 Vaccination 04/02/2020, 10/27/2020, 03/02/2021   Zoster Recombinat (Shingrix) 09/20/2009   Pertinent  Health Maintenance Due  Topic Date Due   INFLUENZA VACCINE  01/09/2023      02/13/2022   11:00 AM 02/13/2022    9:30 PM 02/14/2022   10:00 AM 02/14/2022    7:30 PM 02/15/2022    7:38 AM  Fall Risk  (RETIRED) Patient Fall Risk Level High fall risk High fall risk High fall risk High fall risk High fall risk   Functional Status Survey:    Vitals:   09/24/22 1114  BP: 105/61  Pulse: 79  Resp: 16  Temp: 98 F (36.7 C)  SpO2: 95%  Weight: 199 lb (90.3 kg)  Height: 5\' 10"  (1.778 m)   Body mass index is 28.55 kg/m. Physical Exam Constitutional:      General: He is not in acute distress.    Appearance: He is well-developed. He is not diaphoretic.  HENT:     Head: Normocephalic and atraumatic.     Right Ear: External ear normal.     Left Ear: External ear normal.     Mouth/Throat:     Pharynx: No oropharyngeal exudate.  Eyes:     Conjunctiva/sclera: Conjunctivae normal.     Pupils: Pupils are equal, round, and reactive to light.  Cardiovascular:     Rate and Rhythm: Normal rate and regular rhythm.     Heart sounds: Normal heart sounds.  Pulmonary:     Effort: Pulmonary effort is  normal.     Breath sounds: Normal breath sounds.  Abdominal:     General: Bowel sounds are normal.     Palpations: Abdomen is soft.  Musculoskeletal:        General: No tenderness.     Cervical back: Normal range of motion and neck supple.     Right lower leg: No edema.     Left lower leg: No edema.  Skin:    General: Skin is warm and dry.  Neurological:     Mental Status: He is alert and oriented to person, place, and time.     Labs reviewed: Recent Labs    02/12/22 0708 02/13/22 0604 02/14/22 0400 02/15/22 0431 08/01/22 2022 08/12/22 0000  NA 141 141  --   --  140 143  K 3.1* 3.6  --   --  4.0 4.2  CL 109 111  --   --  109 109*  CO2 24 22  --   --  22 26*  GLUCOSE 123* 126*  --   --  123*  --   BUN 49* 52*  --   --  37* 33*  CREATININE 2.06* 1.91*  --   --  2.06* 1.9*  CALCIUM 8.4* 8.2*  --   --  8.9 8.6*  MG 2.6* 2.5* 2.6* 2.5*  --   --    Recent Labs    02/07/22 1345 02/09/22 0640 08/12/22 0000  AST 28 51* 16  ALT 20 22 15   ALKPHOS 56 55 58  BILITOT 1.3* 1.6*  --   PROT 7.8 7.1  --   ALBUMIN 4.2 3.5 3.9   Recent Labs    10/15/21 0000 02/07/22 1345 02/07/22 1807 02/09/22 0640 02/12/22 0708 02/13/22 0604 08/01/22 2022 08/12/22 0000  WBC 7.2   < > 12.0*   < > 8.3 5.9 6.9 5.7  NEUTROABS 3,377.00  --  10.0*  --   --   --   --  2,440.00  HGB 14.5   < > 11.7*   < > 12.8* 12.1* 13.4 13.0*  HCT 43   < > 35.9*   < > 39.5 37.4* 41.2 39*  MCV  --    < > 92.5   < > 90.4 90.6 94.1  --   PLT 195   < > 147*   < >  207 211 184 172   < > = values in this interval not displayed.   Lab Results  Component Value Date   TSH 0.091 (L) 02/07/2022   Lab Results  Component Value Date   HGBA1C 6.2 (H) 02/09/2022   Lab Results  Component Value Date   CHOL 151 08/12/2022   HDL 39 08/12/2022   LDLCALC 94 08/12/2022   TRIG 87 08/12/2022   CHOLHDL 2.7 02/09/2022    Significant Diagnostic Results in last 30 days:  No results found.  Assessment/Plan 1. Primary  hypertension -Blood pressure well controlled, goal bp <140/90 Continue current medications and dietary modifications follow metabolic panel  2. Stage 3b chronic kidney disease -Chronic and stable Encourage proper hydration Follow metabolic panel Avoid nephrotoxic meds (NSAIDS), will DC advil at this time  3. Chronic low back pain without sciatica, unspecified back pain laterality -worsening pain in the morning and uncomfortable during the day.  Using tylenol and advil PRN for pain- to dc advil due to CKD -continue tylenol PRN, can use voltaren gel topically PRN - Ambulatory referral to Physical Therapy for evaluation and treatment   4. Prediabetes -continue dietary modifications, will follow up a1c  5. Gastroesophageal reflux disease without esophagitis -well controlled, not currently on medication  6. Presbycusis of both ears -continues with hearing aides bilaterally  7. Pure hypercholesterolemia -well controlled on zocor. Continue at this time.   8. Mild cognitive impairment -stable at this time, continues with supportive care at [facility] and has a not of family involvement  9. Slow transit constipation -well controlled on current medications.    Janene Harvey. [facility] [phone]

## 2022-09-26 LAB — BASIC METABOLIC PANEL
BUN: 27 — AB (ref 4–21)
CO2: 24 — AB (ref 13–22)
Chloride: 109 — AB (ref 99–108)
Creatinine: 1.9 — AB (ref 0.6–1.3)
Glucose: 97
Potassium: 4.4 mEq/L (ref 3.5–5.1)
Sodium: 139 (ref 137–147)

## 2022-09-26 LAB — CBC AND DIFFERENTIAL
HCT: 39 — AB (ref 41–53)
Hemoglobin: 13 — AB (ref 13.5–17.5)
Neutrophils Absolute: 3417
Platelets: 235 10*3/uL (ref 150–400)
WBC: 6.7

## 2022-09-26 LAB — COMPREHENSIVE METABOLIC PANEL
Albumin: 3.9 (ref 3.5–5.0)
Calcium: 9.1 (ref 8.7–10.7)
Globulin: 2.6

## 2022-09-26 LAB — CBC: RBC: 4.22 (ref 3.87–5.11)

## 2022-09-26 LAB — HEPATIC FUNCTION PANEL
ALT: 16 U/L (ref 10–40)
AST: 16 (ref 14–40)
Alkaline Phosphatase: 55 (ref 25–125)
Bilirubin, Total: 0.5

## 2022-09-26 LAB — TSH: TSH: 0.28 — AB (ref 0.41–5.90)

## 2022-09-26 LAB — HEMOGLOBIN A1C: Hemoglobin A1C: 6.4

## 2022-11-05 DIAGNOSIS — H903 Sensorineural hearing loss, bilateral: Secondary | ICD-10-CM | POA: Insufficient documentation

## 2022-11-06 ENCOUNTER — Encounter: Payer: Self-pay | Admitting: Nurse Practitioner

## 2022-11-06 ENCOUNTER — Non-Acute Institutional Stay (INDEPENDENT_AMBULATORY_CARE_PROVIDER_SITE_OTHER): Payer: Medicare Other | Admitting: Nurse Practitioner

## 2022-11-06 DIAGNOSIS — Z0000 Encounter for general adult medical examination without abnormal findings: Secondary | ICD-10-CM | POA: Diagnosis not present

## 2022-11-06 NOTE — Progress Notes (Signed)
Subjective:   Frederick Simon is a 89 y.o. male who presents for Medicare Annual/Subsequent preventive examination.  Review of Systems     Cardiac Risk Factors include: hypertension;dyslipidemia;advanced age (>30men, >26 women)     Objective:    Today's Vitals   11/06/22 1126  BP: 130/81  Pulse: 69  Weight: 195 lb 9.6 oz (88.7 kg)  Height: 5\' 10"  (1.778 m)  PainSc: 0-No pain   Body mass index is 28.07 kg/m. Filed Weights   11/06/22 1126  Weight: 195 lb 9.6 oz (88.7 kg)        11/06/2022   11:46 AM 09/24/2022   11:27 AM 08/28/2022   10:06 AM 08/09/2022    1:54 PM 06/12/2022   12:06 PM 02/08/2022    8:00 PM 02/07/2022    1:44 PM  Advanced Directives  Does Patient Have a Medical Advance Directive? Yes Yes Yes Yes Yes Yes Yes  Type of Estate agent of [place];Out of facility DNR (pink MOST or yellow form) Out of facility DNR (pink MOST or yellow form) Out of facility DNR (pink MOST or yellow form) Out of facility DNR (pink MOST or yellow form) Out of facility DNR (pink MOST or yellow form) Healthcare Power of [employer] of [place];Living will;Out of facility DNR (pink MOST or yellow form)  Does patient want to make changes to medical advance directive? No - Patient declined No - Patient declined No - Patient declined No - Patient declined No - Patient declined No - Patient declined   Copy of Healthcare Power of Attorney in Chart? Yes - validated most recent copy scanned in chart (See row information)        Pre-existing out of facility DNR order (yellow form or pink MOST form) Yellow form placed in chart (order not valid for inpatient use)          Current Medications (verified) Outpatient Encounter Medications as of 11/06/2022  Medication Sig   acetaminophen (TYLENOL) 325 MG tablet Take 650 mg by mouth every 4 (four) hours as needed.   acetaminophen (TYLENOL) 500 MG tablet Take 1,000 mg by mouth every 8 (eight) hours as needed for mild pain or  moderate pain.   albuterol (VENTOLIN HFA) 108 (90 Base) MCG/ACT inhaler Inhale 1 puff into the lungs every 4 (four) hours as needed for wheezing or shortness of breath.   alum & mag hydroxide-simeth (MAALOX/MYLANTA) 200-200-20 MG/5ML suspension ) Give 2 Tbsp by mouth every 4 hours as needed for gas, indigestion, or upset stomach supervised self-administration Notify MD if no relief.   amLODipine (NORVASC) 5 MG tablet Take 5 mg by mouth daily.   bismuth subsalicylate (PEPTO BISMOL) 262 MG/15ML suspension Give 10cc by mouth as needed for loose stool   calcium carbonate (TUMS - DOSED IN MG ELEMENTAL CALCIUM) 500 MG chewable tablet Chew 1 tablet by mouth every 6 (six) hours as needed for indigestion or heartburn.   carbamide peroxide (DEBROX) 6.5 % OTIC solution Place 5 drops into both ears as needed (for ear wax).   Cholecalciferol (VITAMIN D) 50 MCG (2000 UT) CAPS Give one capsule by mouth once daily.   diclofenac Sodium (VOLTAREN) 1 % GEL Apply to lower back topically every 12 hours as needed for pain.   Glucose 15 GM/32ML GEL Give 1 packet by mouth as needed for low blood sugar.   lidocaine 4 % Place 1 patch onto the skin. Apply to lower back topically as needed for pain, remove after 12  hrs.   losartan (COZAAR) 100 MG tablet Take 100 mg by mouth daily.   magnesium hydroxide (MILK OF MAGNESIA) 400 MG/5ML suspension Give 2 Tbsp by mouth as needed for constipation. supervised self-administration daily  Call MD if no relief in 3 days of continued use.   nystatin (MYCOSTATIN/NYSTOP) powder Apply 1 Application topically 2 (two) times daily.   omeprazole (PRILOSEC OTC) 20 MG tablet Take 20 mg by mouth daily.   ondansetron (ZOFRAN) 4 MG tablet Take 4 mg by mouth 3 (three) times daily as needed for nausea or vomiting.   OXYGEN 2lmp for dyspnea or SOB   polyethylene glycol (MIRALAX / GLYCOLAX) 17 g packet Take 17 g by mouth daily as needed.   pseudoephedrine-dextromethorphan-guaifenesin (ROBITUSSIN-PE)  30-10-100 MG/5ML solution Take 10 mLs by mouth every 4 (four) hours as needed for cough.   sennosides-docusate sodium (SENOKOT-S) 8.6-50 MG tablet Take 2 tablets by mouth at bedtime.   simvastatin (ZOCOR) 20 MG tablet Take 20 mg by mouth daily.   vitamin B-12 (CYANOCOBALAMIN) 1000 MCG tablet Take 1,000 mcg by mouth daily.   [DISCONTINUED] ibuprofen (ADVIL) 200 MG tablet Take 200 mg by mouth every 12 (twelve) hours as needed.   [DISCONTINUED] ondansetron (ZOFRAN) 4 MG tablet Take 2 tablets (8 mg total) by mouth every 8 (eight) hours as needed for nausea or vomiting.   No facility-administered encounter medications on file as of 11/06/2022.    Allergies (verified) Dust mite extract and Wool alcohol [lanolin]   History: Past Medical History:  Diagnosis Date   Acute hypoxemic respiratory failure (HCC) 02/10/2022   AKI (acute kidney injury) (HCC) 02/09/2022   Allergic rhinitis    CAP (community acquired pneumonia) 02/09/2022   Gastroesophageal reflux disease    Hyperlipidemia    Hypertension    Nausea & vomiting 02/11/2022   Severe sepsis (HCC) 02/10/2022   Past Surgical History:  Procedure Laterality Date   BACK SURGERY  May 1975    Dr. Hilton Cork SURGERY  Feb 2004   Dr. Newell Coral    HAND SURGERY  [phone]   Dr. Priscille Kluver   NOSE SURGERY  1989   Dr. Ned Grace SURGERY  [phone]   Dr. Priscille Kluver   Family History  Problem Relation Age of Onset   Cancer Mother    Hypertension Mother    Hypertension Father    Stroke Father    Lupus Sister    Social History   Socioeconomic History   Marital status: Widowed    Spouse name: Not on file   Number of children: 4   Years of education: Not on file   Highest education level: Not on file  Occupational History   Occupation: Geologist, engineering: [employer]  Tobacco Use   Smoking status: Former   Smokeless tobacco: Never   Tobacco comments:    quit 1980's  Vaping Use   Vaping Use: Never used  Substance and  Sexual Activity   Alcohol use: No   Drug use: No   Sexual activity: Not on file  Other Topics Concern   Not on file  Social History Narrative   4 daughters      Has advanced directives   Daughter Frederick Simon is health care POA   Requests DNR--done 08/03/20   No feeding tube if cognitively unaware   Social Determinants of Health   Financial Resource Strain: Not on file  Food Insecurity: Not on file  Transportation Needs: Not on file  Physical Activity:  Not on file  Stress: Not on file  Social Connections: Not on file    Tobacco Counseling Counseling given: Not Answered Tobacco comments: quit 1980's   Clinical Intake:  Pre-visit preparation completed: Yes  Pain : No/denies pain Pain Score: 0-No pain     BMI - recorded: 28 Nutritional Status: BMI 25 -29 Overweight Nutritional Risks: None     Diabetic?no         Activities of Daily Living    11/06/2022   12:08 PM 02/08/2022    8:00 PM  In your present state of health, do you have any difficulty performing the following activities:  Hearing? 1 1  Vision? 0 0  Difficulty concentrating or making decisions? 1 1  Walking or climbing stairs? 1 1  Dressing or bathing? 0 1  Doing errands, shopping? 1 1  Comment has help of children   Preparing Food and eating ? N   Using the Toilet? N   In the past six months, have you accidently leaked urine? N   Do you have problems with loss of bowel control? N   Managing your Medications? Y   Managing your Finances? Y   Comment daughters help   Housekeeping or managing your Housekeeping? Y     Patient Care Team: Earnestine Mealing, MD as PCP - General (Family Medicine) Sharon Seller, NP as Nurse Practitioner (Geriatric Medicine) Iran Ouch, MD as Consulting Physician (Cardiology)  Indicate any recent Medical Services you may have received from other than [facility] providers in the past year (date may be approximate).     Assessment:   This is a routine wellness  examination for Frederick Simon.  Hearing/Vision screen No results found.  Dietary issues and exercise activities discussed: Current Exercise Habits: Home exercise routine, Type of exercise: walking   Goals Addressed   None    Depression Screen     No data to display          Fall Risk     No data to display          FALL RISK PREVENTION PERTAINING TO THE HOME:  Any stairs in or around the home? No  If so, are there any without handrails? na Home free of loose throw rugs in walkways, pet beds, electrical cords, etc? Yes  Adequate lighting in your home to reduce risk of falls? Yes   ASSISTIVE DEVICES UTILIZED TO PREVENT FALLS:  Life alert? Yes  Use of a cane, walker or w/c? Yes  Grab bars in the bathroom? Yes  Shower chair or bench in shower? No  Elevated toilet seat or a handicapped toilet? Yes   TIMED UP AND GO:  Was the test performed? No .    Cognitive Function:        Immunizations Immunization History  Administered Date(s) Administered   Covid-19, Mrna,Vaccine(Spikevax)72yrs and older 09/17/2022   Influenza-Unspecified 03/27/2021, 03/27/2022   Moderna Sars-Covid-2 Vaccination 07/13/2019, 08/12/2019, 11/06/2021, 04/19/2022   Pneumococcal Polysaccharide-23 11/17/2017   Tdap 12/25/2012   Unspecified SARS-COV-2 Vaccination 04/02/2020, 10/27/2020, 03/02/2021   Zoster Recombinat (Shingrix) 09/20/2009    TDAP status: Up to date  Flu Vaccine status: Up to date  Pneumococcal vaccine status: Due, Education has been provided regarding the importance of this vaccine. Advised may receive this vaccine at local pharmacy or Health Dept. Aware to provide a copy of the vaccination record if obtained from local pharmacy or Health Dept. Verbalized acceptance and understanding.  Covid-19 vaccine status: Completed vaccines  Qualifies for  Shingles Vaccine? Yes   Zostavax completed No   Shingrix Completed?: No.    Education has been provided regarding the importance of  this vaccine. Patient has been advised to call insurance company to determine out of pocket expense if they have not yet received this vaccine. Advised may also receive vaccine at local pharmacy or Health Dept. Verbalized acceptance and understanding.  Screening Tests Health Maintenance  Topic Date Due   Zoster Vaccines- Shingrix (2 of 2) 11/15/2009   Pneumonia Vaccine 50+ Years old (2 of 2 - PCV) 11/18/2018   DTaP/Tdap/Td (2 - Td or Tdap) 12/26/2022   INFLUENZA VACCINE  01/09/2023   Medicare Annual Wellness (AWV)  11/06/2023   COVID-19 Vaccine  Completed   HPV VACCINES  Aged Out    Health Maintenance  Health Maintenance Due  Topic Date Due   Zoster Vaccines- Shingrix (2 of 2) 11/15/2009   Pneumonia Vaccine 31+ Years old (2 of 2 - PCV) 11/18/2018    Colorectal cancer screening: No longer required.   Lung Cancer Screening: (Low Dose CT Chest recommended if Age 45-80 years, 30 pack-year currently smoking OR have quit w/in 15years.) does not qualify.   Lung Cancer Screening Referral: na  Additional Screening:  Hepatitis C Screening: does not qualify; Completed   Vision Screening: Recommended annual ophthalmology exams for early detection of glaucoma and other disorders of the eye. Is the patient up to date with their annual eye exam?  No  Who is the provider or what is the name of the office in which the patient attends annual eye exams? [facility]  If pt is not established with a provider, would they like to be referred to a provider to establish care? No .   Dental Screening: Recommended annual dental exams for proper oral hygiene  Community Resource Referral / Chronic Care Management: CRR required this visit?  No   CCM required this visit?  No      Plan:     I have personally reviewed and noted the following in the patient's chart:   Medical and social history Use of alcohol, tobacco or illicit drugs  Current medications and supplements including opioid prescriptions.  Patient is not currently taking opioid prescriptions. Functional ability and status Nutritional status Physical activity Advanced directives List of other physicians Hospitalizations, surgeries, and ER visits in previous 12 months Vitals Screenings to include cognitive, depression, and falls Referrals and appointments  In addition, I have reviewed and discussed with patient certain preventive protocols, quality metrics, and best practice recommendations. A written personalized care plan for preventive services as well as general preventive health recommendations were provided to patient.     Sharon Seller, NP   11/06/2022   Place of service: [facility]

## 2022-11-06 NOTE — Patient Instructions (Signed)
  Mr. Frederick Simon , Thank you for taking time to come for your Medicare Wellness Visit. I appreciate your ongoing commitment to your health goals. Please review the following plan we discussed and let me know if I can assist you in the future.   These are the goals we discussed:  Goals   None     This is a list of the screening recommended for you and due dates:  Health Maintenance  Topic Date Due   Zoster (Shingles) Vaccine (2 of 2) 11/15/2009   Pneumonia Vaccine (2 of 2 - PCV) 11/18/2018   DTaP/Tdap/Td vaccine (2 - Td or Tdap) 12/26/2022   Flu Shot  01/09/2023   Medicare Annual Wellness Visit  11/06/2023   COVID-19 Vaccine  Completed   HPV Vaccine  Aged Out

## 2022-11-29 ENCOUNTER — Non-Acute Institutional Stay: Payer: Medicare Other | Admitting: Student

## 2022-11-29 ENCOUNTER — Encounter: Payer: Self-pay | Admitting: Student

## 2022-11-29 DIAGNOSIS — R238 Other skin changes: Secondary | ICD-10-CM

## 2022-11-29 DIAGNOSIS — L304 Erythema intertrigo: Secondary | ICD-10-CM | POA: Diagnosis not present

## 2022-11-29 NOTE — Progress Notes (Unsigned)
Location:  Other [facility].  Nursing Home Room Number: [address] Place of Service:  ALF [phone]) Provider:  Earnestine Mealing, MD  Patient Care Team: Earnestine Mealing, MD as PCP - General (Family Medicine) Sharon Seller, NP as Nurse Practitioner (Geriatric Medicine) Iran Ouch, MD as Consulting Physician (Cardiology)  Extended Emergency Contact Information Primary Emergency Contact: Amanda Cockayne [place] Home Phone: [phone] Mobile Phone: [phone] Relation: Daughter Secondary Emergency Contact: Waldon Merl [place] Mobile Phone: [phone] Relation: Daughter  Code Status:  DNR Goals of care: Advanced Directive information    11/29/2022    9:44 AM  Advanced Directives  Type of Advance Directive Healthcare Power of [place];Out of facility DNR (pink MOST or yellow form)  Copy of Healthcare Power of Attorney in Chart? Yes - validated most recent copy scanned in chart (See row information)     Chief Complaint  Patient presents with   Acute Visit    Bump on Ear.     HPI:  Pt is a 89 y.o. male seen today for an acute visit for Bump on Ear.   Patient has had a bump in his left ear preventing him from using his HA. It is painful to touch.   Rash on his abdomen. Patient unable to give accurate history regarding the rash, but it does itch.   Past Medical History:  Diagnosis Date   Acute hypoxemic respiratory failure (HCC) 02/10/2022   AKI (acute kidney injury) (HCC) 02/09/2022   Allergic rhinitis    CAP (community acquired pneumonia) 02/09/2022   Gastroesophageal reflux disease    Hyperlipidemia    Hypertension    Nausea & vomiting 02/11/2022   Severe sepsis (HCC) 02/10/2022   Past Surgical History:  Procedure Laterality Date   BACK SURGERY  May 1975    Dr. Hilton Cork SURGERY  Feb 2004   Dr. Newell Coral    HAND SURGERY  [phone]   Dr. Priscille Kluver   NOSE SURGERY  1989   Dr. Ned Grace  SURGERY  [phone]   Dr. Priscille Kluver    Allergies  Allergen Reactions   Dust Mite Extract Other (See Comments)    sneeze   Wool Alcohol [Lanolin] Itching    Outpatient Encounter Medications as of 11/29/2022  Medication Sig   acetaminophen (TYLENOL) 325 MG tablet Take 650 mg by mouth every 4 (four) hours as needed.   acetaminophen (TYLENOL) 500 MG tablet Take 1,000 mg by mouth every 8 (eight) hours as needed for mild pain or moderate pain.   albuterol (VENTOLIN HFA) 108 (90 Base) MCG/ACT inhaler Inhale 1 puff into the lungs every 4 (four) hours as needed for wheezing or shortness of breath.   alum & mag hydroxide-simeth (MAALOX/MYLANTA) 200-200-20 MG/5ML suspension ) Give 2 Tbsp by mouth every 4 hours as needed for gas, indigestion, or upset stomach supervised self-administration Notify MD if no relief.   amLODipine (NORVASC) 5 MG tablet Take 5 mg by mouth daily.   bismuth subsalicylate (PEPTO BISMOL) 262 MG/15ML suspension Give 10cc by mouth as needed for loose stool   calcium carbonate (TUMS - DOSED IN MG ELEMENTAL CALCIUM) 500 MG chewable tablet Chew 1 tablet by mouth every 6 (six) hours as needed for indigestion or heartburn.   carbamide peroxide (DEBROX) 6.5 % OTIC solution Place 5 drops into both ears as needed (for ear wax).   Cholecalciferol (VITAMIN D) 50 MCG (2000 UT) CAPS Give one capsule by mouth once daily.  diclofenac Sodium (VOLTAREN) 1 % GEL Apply to lower back topically every 12 hours as needed for pain.   Glucose 15 GM/32ML GEL Give 1 packet by mouth as needed for low blood sugar.   lidocaine 4 % Place 1 patch onto the skin. Apply to lower back topically as needed for pain, remove after 12 hrs.   losartan (COZAAR) 100 MG tablet Take 100 mg by mouth daily.   magnesium hydroxide (MILK OF MAGNESIA) 400 MG/5ML suspension Give 2 Tbsp by mouth as needed for constipation. supervised self-administration daily  Call MD if no relief in 3 days of continued use.   nystatin  (MYCOSTATIN/NYSTOP) powder Apply 1 Application topically 2 (two) times daily.   omeprazole (PRILOSEC OTC) 20 MG tablet Take 20 mg by mouth daily.   ondansetron (ZOFRAN) 4 MG tablet Take 4 mg by mouth 3 (three) times daily as needed for nausea or vomiting.   OXYGEN 2lmp for dyspnea or SOB   polyethylene glycol (MIRALAX / GLYCOLAX) 17 g packet Take 17 g by mouth daily as needed.   pseudoephedrine-dextromethorphan-guaifenesin (ROBITUSSIN-PE) 30-10-100 MG/5ML solution Take 10 mLs by mouth every 4 (four) hours as needed for cough.   sennosides-docusate sodium (SENOKOT-S) 8.6-50 MG tablet Take 2 tablets by mouth at bedtime.   simvastatin (ZOCOR) 20 MG tablet Take 20 mg by mouth daily.   vitamin B-12 (CYANOCOBALAMIN) 1000 MCG tablet Take 1,000 mcg by mouth daily.   No facility-administered encounter medications on file as of 11/29/2022.    Review of Systems  Immunization History  Administered Date(s) Administered   Covid-19, Mrna,Vaccine(Spikevax)52yrs and older 09/17/2022   Influenza-Unspecified 03/27/2021, 03/27/2022   Moderna Sars-Covid-2 Vaccination 07/13/2019, 08/12/2019, 11/06/2021, 04/19/2022   Pneumococcal Conjugate-13 06/10/2013   Pneumococcal Polysaccharide-23 06/11/2007, 11/17/2017   Tdap 12/25/2012   Unspecified SARS-COV-2 Vaccination 04/02/2020, 10/27/2020, 03/02/2021   Zoster Recombinat (Shingrix) 09/20/2009, 11/12/2022   Pertinent  Health Maintenance Due  Topic Date Due   INFLUENZA VACCINE  01/09/2023      02/13/2022   11:00 AM 02/13/2022    9:30 PM 02/14/2022   10:00 AM 02/14/2022    7:30 PM 02/15/2022    7:38 AM  Fall Risk  (RETIRED) Patient Fall Risk Level High fall risk High fall risk High fall risk High fall risk High fall risk   Functional Status Survey:    Vitals:   11/29/22 0922 11/29/22 0947  BP: (!) 163/85 130/81  Pulse: 73   Resp: (!) 24   Temp: 97.6 F (36.4 C)   SpO2: 97%   Weight: 197 lb (89.4 kg)   Height: 5\' 10"  (1.778 m)    Body mass index is 28.27  kg/m. Physical Exam Constitutional:      Appearance: Normal appearance.  HENT:     Right Ear: Tympanic membrane normal.     Left Ear: Tympanic membrane normal.     Ears:     Comments: Left ear with papule on in the ear canal. No redness or drainage.  Cardiovascular:     Rate and Rhythm: Normal rate and regular rhythm.  Pulmonary:     Effort: Pulmonary effort is normal.     Breath sounds: Normal breath sounds.  Skin:    Comments: Rash under bilateral breast tissue, image below.   Neurological:     Mental Status: He is alert.  Psychiatric:        Mood and Affect: Mood normal.        Labs reviewed: Recent Labs    02/12/22 0708 02/13/22  8295 02/14/22 0400 02/15/22 0431 08/01/22 2022 08/12/22 0000 09/26/22 0000  NA 141 141  --   --  140 143 139  K 3.1* 3.6  --   --  4.0 4.2 4.4  CL 109 111  --   --  109 109* 109*  CO2 24 22  --   --  22 26* 24*  GLUCOSE 123* 126*  --   --  123*  --   --   BUN 49* 52*  --   --  37* 33* 27*  CREATININE 2.06* 1.91*  --   --  2.06* 1.9* 1.9*  CALCIUM 8.4* 8.2*  --   --  8.9 8.6* 9.1  MG 2.6* 2.5* 2.6* 2.5*  --   --   --    Recent Labs    02/07/22 1345 02/09/22 0640 08/12/22 0000 09/26/22 0000  AST 28 51* 16 16  ALT 20 22 15 16   ALKPHOS 56 55 58 55  BILITOT 1.3* 1.6*  --   --   PROT 7.8 7.1  --   --   ALBUMIN 4.2 3.5 3.9 3.9   Recent Labs    02/07/22 1807 02/09/22 0640 02/12/22 0708 02/13/22 0604 08/01/22 2022 08/12/22 0000 09/26/22 0000  WBC 12.0*   < > 8.3 5.9 6.9 5.7 6.7  NEUTROABS 10.0*  --   --   --   --  2,440.00 3,417.00  HGB 11.7*   < > 12.8* 12.1* 13.4 13.0* 13.0*  HCT 35.9*   < > 39.5 37.4* 41.2 39* 39*  MCV 92.5   < > 90.4 90.6 94.1  --   --   PLT 147*   < > 207 211 184 172 235   < > = values in this interval not displayed.   Lab Results  Component Value Date   TSH 0.28 (A) 09/26/2022   Lab Results  Component Value Date   HGBA1C 6.4 09/26/2022   Lab Results  Component Value Date   CHOL 151  08/12/2022   HDL 39 08/12/2022   LDLCALC 94 08/12/2022   TRIG 87 08/12/2022   CHOLHDL 2.7 02/09/2022    Significant Diagnostic Results in last 30 days:  No results found.  Assessment/Plan Intertrigo  Fibrous papule of left ear Patient with new intertrigo under breast line. Plan to start combination of Zinc Oxide and Nystatin powder applied daily. Continue to monitor papule in ear.   Family/ staff Communication: nursing  Labs/tests ordered:  none

## 2022-12-18 ENCOUNTER — Encounter: Payer: Self-pay | Admitting: Student

## 2022-12-18 ENCOUNTER — Non-Acute Institutional Stay: Payer: Medicare Other | Admitting: Student

## 2022-12-18 DIAGNOSIS — N1832 Chronic kidney disease, stage 3b: Secondary | ICD-10-CM

## 2022-12-18 DIAGNOSIS — G3184 Mild cognitive impairment, so stated: Secondary | ICD-10-CM

## 2022-12-18 DIAGNOSIS — L304 Erythema intertrigo: Secondary | ICD-10-CM | POA: Diagnosis not present

## 2022-12-18 DIAGNOSIS — M545 Low back pain, unspecified: Secondary | ICD-10-CM | POA: Diagnosis not present

## 2022-12-18 DIAGNOSIS — I10 Essential (primary) hypertension: Secondary | ICD-10-CM | POA: Diagnosis not present

## 2022-12-18 DIAGNOSIS — G8929 Other chronic pain: Secondary | ICD-10-CM

## 2022-12-18 DIAGNOSIS — K219 Gastro-esophageal reflux disease without esophagitis: Secondary | ICD-10-CM

## 2022-12-18 DIAGNOSIS — M159 Polyosteoarthritis, unspecified: Secondary | ICD-10-CM

## 2022-12-18 NOTE — Progress Notes (Unsigned)
Location:  Other [facility].  Nursing Home Room Number: [address] Place of Service:  ALF [phone]) Provider:  Earnestine Mealing, MD  Patient Care Team: Earnestine Mealing, MD as PCP - General (Family Medicine) Sharon Seller, NP as Nurse Practitioner (Geriatric Medicine) Iran Ouch, MD as Consulting Physician (Cardiology)  Extended Emergency Contact Information Primary Emergency Contact: Amanda Cockayne [place] Home Phone: [phone] Mobile Phone: [phone] Relation: Daughter Secondary Emergency Contact: Waldon Merl [place] Mobile Phone: [phone] Relation: Daughter  Code Status:  DNR Goals of care: Advanced Directive information    12/18/2022    9:30 AM  Advanced Directives  Does Patient Have a Medical Advance Directive? Yes  Type of Advance Directive Out of facility DNR (pink MOST or yellow form)  Does patient want to make changes to medical advance directive? No - Patient declined     Chief Complaint  Patient presents with   Medical Management of Chronic Issues    Medical Management of Chronic Issues.     HPI:  Pt is a 89 y.o. male seen today for medical management of chronic diseases.    He just got home from a baseball game.   He has some chronic knee pain. Voltaren gel and tylenol help. He tries to move as much as he can. He's wife stopped walking, then she really stopped walking!   Rash has improved a lot. "I'm young and I heal easily."  He is breathing fine. HE sometimes feel short of breath, but this has been a stable issue "his large brain needs a lot of oxygen."  Denies issues with acid reflux.  Past Medical History:  Diagnosis Date   Acute hypoxemic respiratory failure (HCC) 02/10/2022   AKI (acute kidney injury) (HCC) 02/09/2022   Allergic rhinitis    CAP (community acquired pneumonia) 02/09/2022   Gastroesophageal reflux disease    Hyperlipidemia    Hypertension    Nausea & vomiting  02/11/2022   Severe sepsis (HCC) 02/10/2022   Past Surgical History:  Procedure Laterality Date   BACK SURGERY  May 1975    Dr. Hilton Cork SURGERY  Feb 2004   Dr. Newell Coral    HAND SURGERY  [phone]   Dr. Priscille Kluver   NOSE SURGERY  1989   Dr. Ned Grace SURGERY  [phone]   Dr. Priscille Kluver    Allergies  Allergen Reactions   Dust Mite Extract Other (See Comments)    sneeze   Wool Alcohol [Lanolin] Itching    Outpatient Encounter Medications as of 12/18/2022  Medication Sig   acetaminophen (TYLENOL) 325 MG tablet Take 650 mg by mouth every 4 (four) hours as needed.   acetaminophen (TYLENOL) 500 MG tablet Take 1,000 mg by mouth every 8 (eight) hours as needed for mild pain or moderate pain.   albuterol (VENTOLIN HFA) 108 (90 Base) MCG/ACT inhaler Inhale 1 puff into the lungs every 4 (four) hours as needed for wheezing or shortness of breath.   alum & mag hydroxide-simeth (MAALOX/MYLANTA) 200-200-20 MG/5ML suspension ) Give 2 Tbsp by mouth every 4 hours as needed for gas, indigestion, or upset stomach supervised self-administration Notify MD if no relief.   amLODipine (NORVASC) 5 MG tablet Take 5 mg by mouth daily.   bismuth subsalicylate (PEPTO BISMOL) 262 MG/15ML suspension Give 10cc by mouth as needed for loose stool   calcium carbonate (TUMS - DOSED IN MG ELEMENTAL CALCIUM) 500 MG chewable tablet Chew 1 tablet by mouth  every 6 (six) hours as needed for indigestion or heartburn.   carbamide peroxide (DEBROX) 6.5 % OTIC solution Place 5 drops into both ears as needed (for ear wax).   Cholecalciferol (VITAMIN D) 50 MCG (2000 UT) CAPS Give one capsule by mouth once daily.   diclofenac Sodium (VOLTAREN) 1 % GEL Apply to lower back topically every 12 hours as needed for pain.   Glucose 15 GM/32ML GEL Give 1 packet by mouth as needed for low blood sugar.   lidocaine 4 % Place 1 patch onto the skin. Apply to lower back topically as needed for pain, remove after 12 hrs.    losartan (COZAAR) 100 MG tablet Take 100 mg by mouth daily.   magnesium hydroxide (MILK OF MAGNESIA) 400 MG/5ML suspension Give 2 Tbsp by mouth as needed for constipation. supervised self-administration daily  Call MD if no relief in 3 days of continued use.   nystatin (MYCOSTATIN/NYSTOP) powder Apply 1 Application topically 2 (two) times daily.   omeprazole (PRILOSEC OTC) 20 MG tablet Take 20 mg by mouth daily.   ondansetron (ZOFRAN) 4 MG tablet Take 4 mg by mouth 3 (three) times daily as needed for nausea or vomiting.   OXYGEN 2lmp for dyspnea or SOB   polyethylene glycol (MIRALAX / GLYCOLAX) 17 g packet Take 17 g by mouth daily as needed.   pseudoephedrine-dextromethorphan-guaifenesin (ROBITUSSIN-PE) 30-10-100 MG/5ML solution Take 10 mLs by mouth every 4 (four) hours as needed for cough.   sennosides-docusate sodium (SENOKOT-S) 8.6-50 MG tablet Take 2 tablets by mouth at bedtime.   simvastatin (ZOCOR) 20 MG tablet Take 20 mg by mouth daily.   triamcinolone cream (KENALOG) 0.1 % Apply 1 Application topically 2 (two) times daily as needed.   vitamin B-12 (CYANOCOBALAMIN) 1000 MCG tablet Take 1,000 mcg by mouth daily.   No facility-administered encounter medications on file as of 12/18/2022.    Review of Systems  Immunization History  Administered Date(s) Administered   Covid-19, Mrna,Vaccine(Spikevax)72yrs and older 09/17/2022   Influenza-Unspecified 03/27/2021, 03/27/2022   Moderna Sars-Covid-2 Vaccination 07/13/2019, 08/12/2019, 11/06/2021, 04/19/2022   Pneumococcal Conjugate-13 06/10/2013   Pneumococcal Polysaccharide-23 06/11/2007, 11/17/2017   Tdap 12/25/2012   Unspecified SARS-COV-2 Vaccination 04/02/2020, 10/27/2020, 03/02/2021   Zoster Recombinant(Shingrix) 09/20/2009, 11/12/2022   Pertinent  Health Maintenance Due  Topic Date Due   INFLUENZA VACCINE  01/09/2023      02/13/2022   11:00 AM 02/13/2022    9:30 PM 02/14/2022   10:00 AM 02/14/2022    7:30 PM 02/15/2022    7:38 AM   Fall Risk  (RETIRED) Patient Fall Risk Level High fall risk High fall risk High fall risk High fall risk High fall risk   Functional Status Survey:    Vitals:   12/18/22 0920  BP: 135/74  Pulse: 73  Resp: (!) 24  Temp: (!) 97.4 F (36.3 C)  SpO2: 97%  Weight: 197 lb (89.4 kg)  Height: 5\' 10"  (1.778 m)   Body mass index is 28.27 kg/m. Physical Exam Constitutional:      Appearance: Normal appearance.  Cardiovascular:     Rate and Rhythm: Normal rate and regular rhythm.     Pulses: Normal pulses.  Pulmonary:     Effort: Pulmonary effort is normal.     Breath sounds: Normal breath sounds.  Abdominal:     General: Abdomen is flat.     Palpations: Abdomen is soft.  Neurological:     General: No focal deficit present.     Mental Status: He  is alert. Mental status is at baseline.     Comments: Oriented to self and place     Labs reviewed: Recent Labs    02/12/22 0708 02/13/22 0604 02/14/22 0400 02/15/22 0431 08/01/22 2022 08/12/22 0000 09/26/22 0000  NA 141 141  --   --  140 143 139  K 3.1* 3.6  --   --  4.0 4.2 4.4  CL 109 111  --   --  109 109* 109*  CO2 24 22  --   --  22 26* 24*  GLUCOSE 123* 126*  --   --  123*  --   --   BUN 49* 52*  --   --  37* 33* 27*  CREATININE 2.06* 1.91*  --   --  2.06* 1.9* 1.9*  CALCIUM 8.4* 8.2*  --   --  8.9 8.6* 9.1  MG 2.6* 2.5* 2.6* 2.5*  --   --   --    Recent Labs    02/07/22 1345 02/09/22 0640 08/12/22 0000 09/26/22 0000  AST 28 51* 16 16  ALT 20 22 15 16   ALKPHOS 56 55 58 55  BILITOT 1.3* 1.6*  --   --   PROT 7.8 7.1  --   --   ALBUMIN 4.2 3.5 3.9 3.9   Recent Labs    02/07/22 1807 02/09/22 0640 02/12/22 0708 02/13/22 0604 08/01/22 2022 08/12/22 0000 09/26/22 0000  WBC 12.0*   < > 8.3 5.9 6.9 5.7 6.7  NEUTROABS 10.0*  --   --   --   --  2,440.00 3,417.00  HGB 11.7*   < > 12.8* 12.1* 13.4 13.0* 13.0*  HCT 35.9*   < > 39.5 37.4* 41.2 39* 39*  MCV 92.5   < > 90.4 90.6 94.1  --   --   PLT 147*   < >  207 211 184 172 235   < > = values in this interval not displayed.   Lab Results  Component Value Date   TSH 0.28 (A) 09/26/2022   Lab Results  Component Value Date   HGBA1C 6.4 09/26/2022   Lab Results  Component Value Date   CHOL 151 08/12/2022   HDL 39 08/12/2022   LDLCALC 94 08/12/2022   TRIG 87 08/12/2022   CHOLHDL 2.7 02/09/2022    Significant Diagnostic Results in last 30 days:  No results found.  Assessment/Plan Intertrigo  Primary hypertension  Chronic low back pain without sciatica, unspecified back pain laterality  Stage 3b chronic kidney disease (HCC)  Gastroesophageal reflux disease without esophagitis  Primary osteoarthritis involving multiple joints  Mild cognitive impairment Patient's intertrigo improving. BP well-controlled at this time. No concerns with back pain. Kidney function stable, continue quarterly lab collection. GERD well-controlled, decrease dose to every other day for 2 weeks then stop. Continue voltaren gel for knee pain. Continue supportive care with AL for memory changes.   Family/ staff Communication: nursing  Labs/tests ordered:  quarterly BMP   I spent greater than 20 minutes for the care of this patient in face to face time, chart review, clinical documentation, patient education.

## 2022-12-19 ENCOUNTER — Encounter: Payer: Self-pay | Admitting: Student

## 2023-04-08 ENCOUNTER — Encounter: Payer: Self-pay | Admitting: Nurse Practitioner

## 2023-04-08 ENCOUNTER — Non-Acute Institutional Stay: Payer: Medicare Other | Admitting: Nurse Practitioner

## 2023-04-08 DIAGNOSIS — K5901 Slow transit constipation: Secondary | ICD-10-CM

## 2023-04-08 DIAGNOSIS — M150 Primary generalized (osteo)arthritis: Secondary | ICD-10-CM

## 2023-04-08 DIAGNOSIS — I10 Essential (primary) hypertension: Secondary | ICD-10-CM | POA: Diagnosis not present

## 2023-04-08 DIAGNOSIS — K219 Gastro-esophageal reflux disease without esophagitis: Secondary | ICD-10-CM | POA: Diagnosis not present

## 2023-04-08 DIAGNOSIS — N1832 Chronic kidney disease, stage 3b: Secondary | ICD-10-CM

## 2023-04-08 DIAGNOSIS — G8929 Other chronic pain: Secondary | ICD-10-CM

## 2023-04-08 DIAGNOSIS — M545 Low back pain, unspecified: Secondary | ICD-10-CM | POA: Diagnosis not present

## 2023-04-08 DIAGNOSIS — R7303 Prediabetes: Secondary | ICD-10-CM

## 2023-04-08 NOTE — Progress Notes (Signed)
Location:  Other [facility] Room Number: [address] Place of Service:  ALF (13)  Earnestine Mealing, MD  Patient Care Team: Earnestine Mealing, MD as PCP - General (Family Medicine) Sharon Seller, NP as Nurse Practitioner (Geriatric Medicine) Iran Ouch, MD as Consulting Physician (Cardiology)  Extended Emergency Contact Information Primary Emergency Contact: Amanda Cockayne [place] Home Phone: [phone] Mobile Phone: [phone] Relation: Daughter Secondary Emergency Contact: Waldon Merl [place] Mobile Phone: [phone] Relation: Daughter  Goals of care: Advanced Directive information    04/08/2023    2:46 PM  Advanced Directives  Does Patient Have a Medical Advance Directive? Yes  Type of Advance Directive Out of facility DNR (pink MOST or yellow form)  Does patient want to make changes to medical advance directive? No - Patient declined     Chief Complaint  Patient presents with   Medical Management of Chronic Issues    Medical Management of Chronic Issues.     HPI:  Pt is a 89 y.o. male seen today for medical management of chronic disease.  Pt reports he is his usual state of health.  Reports ongoing back pain but he lives with this and it is the same.   No anxiety or depression- reports he gets down occasionally but does not last long.  Gets down because he can not do all the things he wants to do  Sleeps well.   Eating well.   Daughter reports frequent bowel movements with walking but does not report diarrhea.  Past Medical History:  Diagnosis Date   Acute hypoxemic respiratory failure (HCC) 02/10/2022   AKI (acute kidney injury) (HCC) 02/09/2022   Allergic rhinitis    CAP (community acquired pneumonia) 02/09/2022   Gastroesophageal reflux disease    Hyperlipidemia    Hypertension    Nausea & vomiting 02/11/2022   Severe sepsis (HCC) 02/10/2022   Past Surgical History:  Procedure  Laterality Date   BACK SURGERY  May 1975    Dr. Hilton Cork SURGERY  Feb 2004   Dr. Newell Coral    HAND SURGERY  [phone]   Dr. Priscille Kluver   NOSE SURGERY  1989   Dr. Ned Grace SURGERY  [phone]   Dr. Priscille Kluver    Allergies  Allergen Reactions   Dust Mite Extract Other (See Comments)    sneeze   Wool Alcohol [Lanolin] Itching    Outpatient Encounter Medications as of 04/08/2023  Medication Sig   acetaminophen (TYLENOL) 325 MG tablet Take 650 mg by mouth every 4 (four) hours as needed.   acetaminophen (TYLENOL) 500 MG tablet Take 1,000 mg by mouth every 8 (eight) hours as needed for mild pain or moderate pain.   albuterol (VENTOLIN HFA) 108 (90 Base) MCG/ACT inhaler Inhale 1 puff into the lungs every 4 (four) hours as needed for wheezing or shortness of breath.   alum & mag hydroxide-simeth (MAALOX/MYLANTA) 200-200-20 MG/5ML suspension ) Give 2 Tbsp by mouth every 4 hours as needed for gas, indigestion, or upset stomach supervised self-administration Notify MD if no relief.   amLODipine (NORVASC) 5 MG tablet Take 5 mg by mouth daily.   bismuth subsalicylate (PEPTO BISMOL) 262 MG/15ML suspension Give 10cc by mouth as needed for loose stool   calcium carbonate (TUMS - DOSED IN MG ELEMENTAL CALCIUM) 500 MG chewable tablet Chew 1 tablet by mouth every 6 (six) hours as needed for indigestion or heartburn.   carbamide peroxide (DEBROX) 6.5 % OTIC  solution Place 5 drops into both ears as needed (for ear wax).   Cholecalciferol (VITAMIN D) 50 MCG (2000 UT) CAPS Give one capsule by mouth once daily.   diclofenac Sodium (VOLTAREN) 1 % GEL Apply to lower back topically every 12 hours as needed for pain.   Glucose 15 GM/32ML GEL Give 1 packet by mouth as needed for low blood sugar.   lidocaine 4 % Place 1 patch onto the skin. Apply to lower back topically as needed for pain, remove after 12 hrs.   losartan (COZAAR) 100 MG tablet Take 100 mg by mouth daily.   magnesium hydroxide  (MILK OF MAGNESIA) 400 MG/5ML suspension Give 2 Tbsp by mouth as needed for constipation. supervised self-administration daily  Call MD if no relief in 3 days of continued use.   nystatin (MYCOSTATIN/NYSTOP) powder Apply 1 Application topically 2 (two) times daily.   ondansetron (ZOFRAN) 4 MG tablet Take 4 mg by mouth 3 (three) times daily as needed for nausea or vomiting.   OXYGEN 2lmp for dyspnea or SOB   polyethylene glycol (MIRALAX / GLYCOLAX) 17 g packet Take 17 g by mouth daily as needed.   pseudoephedrine-dextromethorphan-guaifenesin (ROBITUSSIN-PE) 30-10-100 MG/5ML solution Take 10 mLs by mouth every 4 (four) hours as needed for cough.   sennosides-docusate sodium (SENOKOT-S) 8.6-50 MG tablet Take 2 tablets by mouth at bedtime.   simvastatin (ZOCOR) 20 MG tablet Take 20 mg by mouth daily.   triamcinolone cream (KENALOG) 0.1 % Apply 1 Application topically 2 (two) times daily as needed.   vitamin B-12 (CYANOCOBALAMIN) 1000 MCG tablet Take 1,000 mcg by mouth daily.   [DISCONTINUED] omeprazole (PRILOSEC OTC) 20 MG tablet Take 20 mg by mouth daily. (Patient not taking: Reported on 04/08/2023)   No facility-administered encounter medications on file as of 04/08/2023.    Review of Systems  Constitutional:  Negative for activity change, appetite change, fatigue and unexpected weight change.  HENT:  Positive for hearing loss. Negative for congestion.   Eyes: Negative.   Respiratory:  Negative for cough and shortness of breath.   Cardiovascular:  Negative for chest pain, palpitations and leg swelling.  Gastrointestinal:  Negative for abdominal pain, constipation and diarrhea.  Genitourinary:  Negative for difficulty urinating and dysuria.  Musculoskeletal:  Positive for arthralgias and myalgias.  Skin:  Negative for color change and wound.  Neurological:  Negative for dizziness and weakness.  Psychiatric/Behavioral:  Negative for agitation, behavioral problems and confusion.        Short  term memory loss     Immunization History  Administered Date(s) Administered   Fluad Trivalent(High Dose 65+) 03/28/2023   Influenza-Unspecified 03/27/2021, 03/27/2022   Moderna Covid-19 Fall Seasonal Vaccine 64yrs & older 09/17/2022   Moderna Sars-Covid-2 Vaccination 07/13/2019, 08/12/2019, 11/06/2021, 04/19/2022, 03/07/2023   Pneumococcal Conjugate-13 06/10/2013   Pneumococcal Polysaccharide-23 06/11/2007, 11/17/2017   Tdap 12/25/2012   Unspecified SARS-COV-2 Vaccination 04/02/2020, 10/27/2020, 03/02/2021   Zoster Recombinant(Shingrix) 09/20/2009, 11/12/2022   Pertinent  Health Maintenance Due  Topic Date Due   INFLUENZA VACCINE  Completed      02/13/2022   11:00 AM 02/13/2022    9:30 PM 02/14/2022   10:00 AM 02/14/2022    7:30 PM 02/15/2022    7:38 AM  Fall Risk  (RETIRED) Patient Fall Risk Level High fall risk High fall risk High fall risk High fall risk High fall risk   Functional Status Survey:    Vitals:   04/08/23 1434  BP: 122/68  Pulse: 72  Resp:  18  Temp: (!) 97.4 F (36.3 C)  SpO2: 95%  Weight: 195 lb 3.2 oz (88.5 kg)  Height: 5\' 10"  (1.778 m)   Body mass index is 28.01 kg/m. Physical Exam Constitutional:      General: He is not in acute distress.    Appearance: He is well-developed. He is not diaphoretic.  HENT:     Head: Normocephalic and atraumatic.     Right Ear: External ear normal.     Left Ear: External ear normal.     Mouth/Throat:     Pharynx: No oropharyngeal exudate.  Eyes:     Conjunctiva/sclera: Conjunctivae normal.     Pupils: Pupils are equal, round, and reactive to light.  Cardiovascular:     Rate and Rhythm: Normal rate and regular rhythm.     Heart sounds: Normal heart sounds.  Pulmonary:     Effort: Pulmonary effort is normal.     Breath sounds: Normal breath sounds.  Abdominal:     General: Bowel sounds are normal.     Palpations: Abdomen is soft.  Musculoskeletal:        General: No tenderness.     Cervical back: Normal range  of motion and neck supple.     Right lower leg: No edema.     Left lower leg: No edema.  Skin:    General: Skin is warm and dry.  Neurological:     Mental Status: He is alert. Mental status is at baseline.     Motor: Weakness present.     Gait: Gait abnormal.     Labs reviewed: Recent Labs    08/01/22 2022 08/12/22 0000 09/26/22 0000  NA 140 143 139  K 4.0 4.2 4.4  CL 109 109* 109*  CO2 22 26* 24*  GLUCOSE 123*  --   --   BUN 37* 33* 27*  CREATININE 2.06* 1.9* 1.9*  CALCIUM 8.9 8.6* 9.1   Recent Labs    08/12/22 0000 09/26/22 0000  AST 16 16  ALT 15 16  ALKPHOS 58 55  ALBUMIN 3.9 3.9   Recent Labs    08/01/22 2022 08/12/22 0000 09/26/22 0000  WBC 6.9 5.7 6.7  NEUTROABS  --  2,440.00 3,417.00  HGB 13.4 13.0* 13.0*  HCT 41.2 39* 39*  MCV 94.1  --   --   PLT 184 172 235   Lab Results  Component Value Date   TSH 0.28 (A) 09/26/2022   Lab Results  Component Value Date   HGBA1C 6.4 09/26/2022   Lab Results  Component Value Date   CHOL 151 08/12/2022   HDL 39 08/12/2022   LDLCALC 94 08/12/2022   TRIG 87 08/12/2022   CHOLHDL 2.7 02/09/2022    Significant Diagnostic Results in last 30 days:  No results found.  Assessment/Plan 1. Primary hypertension -Blood pressure well controlled, goal bp <140/90 Continue current medications and dietary modifications follow metabolic panel  2. Chronic low back pain without sciatica, unspecified back pain laterality Stable, continues to use tylenol PRN  3. Stage 3b chronic kidney disease (HCC) -Chronic and stable Encourage proper hydration Follow metabolic panel Avoid nephrotoxic meds (NSAIDS)  4. Gastroesophageal reflux disease without esophagitis Off medication, no complaints of worsening GED  5. Primary osteoarthritis involving multiple joints Stable, without pain at this time.   6. Prediabetes Will followup a1c  7. Constipation Well managed on senokot-s 2 tablets at bedtime, can decrease to 1  tablet if having too frequent stools.   Janene Harvey.  Biagio Borg [facility] [phone]

## 2023-04-08 NOTE — Progress Notes (Deleted)
Location:  Other [place].  Nursing Home Room Number: [address] [place] [address] Place of Service:  ALF [phone]) Abbey Chatters, NP  PCP: Earnestine Mealing, MD  Patient Care Team: Earnestine Mealing, MD as PCP - General (Family Medicine) Sharon Seller, NP as Nurse Practitioner (Geriatric Medicine) Iran Ouch, MD as Consulting Physician (Cardiology)  Extended Emergency Contact Information Primary Emergency Contact: Amanda Cockayne [place] Home Phone: [phone] Mobile Phone: [phone] Relation: Daughter Secondary Emergency Contact: Waldon Merl [place] Mobile Phone: [phone] Relation: Daughter  Goals of care: Advanced Directive information    04/08/2023    2:46 PM  Advanced Directives  Does Patient Have a Medical Advance Directive? Yes  Type of Advance Directive Out of facility DNR (pink MOST or yellow form)  Does patient want to make changes to medical advance directive? No - Patient declined     Chief Complaint  Patient presents with   Medical Management of Chronic Issues    Medical Management of Chronic Issues.     HPI:  Pt is a 89 y.o. male seen today for medical management of chronic disease.    Past Medical History:  Diagnosis Date   Acute hypoxemic respiratory failure (HCC) 02/10/2022   AKI (acute kidney injury) (HCC) 02/09/2022   Allergic rhinitis    CAP (community acquired pneumonia) 02/09/2022   Gastroesophageal reflux disease    Hyperlipidemia    Hypertension    Nausea & vomiting 02/11/2022   Severe sepsis (HCC) 02/10/2022   Past Surgical History:  Procedure Laterality Date   BACK SURGERY  May 1975    Dr. Hilton Cork SURGERY  Feb 2004   Dr. Newell Coral    HAND SURGERY  [phone]   Dr. Priscille Kluver   NOSE SURGERY  1989   Dr. Ned Grace SURGERY  [phone]   Dr. Priscille Kluver    Allergies  Allergen Reactions   Dust Mite Extract Other (See Comments)    sneeze   Wool Alcohol [Lanolin] Itching     Outpatient Encounter Medications as of 04/08/2023  Medication Sig   acetaminophen (TYLENOL) 325 MG tablet Take 650 mg by mouth every 4 (four) hours as needed.   acetaminophen (TYLENOL) 500 MG tablet Take 1,000 mg by mouth every 8 (eight) hours as needed for mild pain or moderate pain.   albuterol (VENTOLIN HFA) 108 (90 Base) MCG/ACT inhaler Inhale 1 puff into the lungs every 4 (four) hours as needed for wheezing or shortness of breath.   alum & mag hydroxide-simeth (MAALOX/MYLANTA) 200-200-20 MG/5ML suspension ) Give 2 Tbsp by mouth every 4 hours as needed for gas, indigestion, or upset stomach supervised self-administration Notify MD if no relief.   amLODipine (NORVASC) 5 MG tablet Take 5 mg by mouth daily.   bismuth subsalicylate (PEPTO BISMOL) 262 MG/15ML suspension Give 10cc by mouth as needed for loose stool   calcium carbonate (TUMS - DOSED IN MG ELEMENTAL CALCIUM) 500 MG chewable tablet Chew 1 tablet by mouth every 6 (six) hours as needed for indigestion or heartburn.   carbamide peroxide (DEBROX) 6.5 % OTIC solution Place 5 drops into both ears as needed (for ear wax).   Cholecalciferol (VITAMIN D) 50 MCG (2000 UT) CAPS Give one capsule by mouth once daily.   diclofenac Sodium (VOLTAREN) 1 % GEL Apply to lower back topically every 12 hours as needed for pain.   Glucose 15 GM/32ML GEL Give 1 packet by mouth as needed for low blood sugar.  lidocaine 4 % Place 1 patch onto the skin. Apply to lower back topically as needed for pain, remove after 12 hrs.   losartan (COZAAR) 100 MG tablet Take 100 mg by mouth daily.   magnesium hydroxide (MILK OF MAGNESIA) 400 MG/5ML suspension Give 2 Tbsp by mouth as needed for constipation. supervised self-administration daily  Call MD if no relief in 3 days of continued use.   nystatin (MYCOSTATIN/NYSTOP) powder Apply 1 Application topically 2 (two) times daily.   ondansetron (ZOFRAN) 4 MG tablet Take 4 mg by mouth 3 (three) times daily as needed  for nausea or vomiting.   OXYGEN 2lmp for dyspnea or SOB   polyethylene glycol (MIRALAX / GLYCOLAX) 17 g packet Take 17 g by mouth daily as needed.   pseudoephedrine-dextromethorphan-guaifenesin (ROBITUSSIN-PE) 30-10-100 MG/5ML solution Take 10 mLs by mouth every 4 (four) hours as needed for cough.   sennosides-docusate sodium (SENOKOT-S) 8.6-50 MG tablet Take 2 tablets by mouth at bedtime.   simvastatin (ZOCOR) 20 MG tablet Take 20 mg by mouth daily.   triamcinolone cream (KENALOG) 0.1 % Apply 1 Application topically 2 (two) times daily as needed.   vitamin B-12 (CYANOCOBALAMIN) 1000 MCG tablet Take 1,000 mcg by mouth daily.   omeprazole (PRILOSEC OTC) 20 MG tablet Take 20 mg by mouth daily. (Patient not taking: Reported on 04/08/2023)   No facility-administered encounter medications on file as of 04/08/2023.    Review of Systems ***  Immunization History  Administered Date(s) Administered   Fluad Trivalent(High Dose 65+) 03/28/2023   Influenza-Unspecified 03/27/2021, 03/27/2022   Moderna Covid-19 Fall Seasonal Vaccine 29yrs & older 09/17/2022   Moderna Sars-Covid-2 Vaccination 07/13/2019, 08/12/2019, 11/06/2021, 04/19/2022, 03/07/2023   Pneumococcal Conjugate-13 06/10/2013   Pneumococcal Polysaccharide-23 06/11/2007, 11/17/2017   Tdap 12/25/2012   Unspecified SARS-COV-2 Vaccination 04/02/2020, 10/27/2020, 03/02/2021   Zoster Recombinant(Shingrix) 09/20/2009, 11/12/2022   Pertinent  Health Maintenance Due  Topic Date Due   INFLUENZA VACCINE  Completed      02/13/2022   11:00 AM 02/13/2022    9:30 PM 02/14/2022   10:00 AM 02/14/2022    7:30 PM 02/15/2022    7:38 AM  Fall Risk  (RETIRED) Patient Fall Risk Level High fall risk High fall risk High fall risk High fall risk High fall risk   Functional Status Survey:    Vitals:   04/08/23 1434  BP: 122/68  Pulse: 72  Resp: 18  Temp: (!) 97.4 F (36.3 C)  SpO2: 95%  Weight: 195 lb 3.2 oz (88.5 kg)  Height: 5\' 10"  (1.778 m)    Body mass index is 28.01 kg/m. Physical Exam***  Labs reviewed: Recent Labs    08/01/22 2022 08/12/22 0000 09/26/22 0000  NA 140 143 139  K 4.0 4.2 4.4  CL 109 109* 109*  CO2 22 26* 24*  GLUCOSE 123*  --   --   BUN 37* 33* 27*  CREATININE 2.06* 1.9* 1.9*  CALCIUM 8.9 8.6* 9.1   Recent Labs    08/12/22 0000 09/26/22 0000  AST 16 16  ALT 15 16  ALKPHOS 58 55  ALBUMIN 3.9 3.9   Recent Labs    08/01/22 2022 08/12/22 0000 09/26/22 0000  WBC 6.9 5.7 6.7  NEUTROABS  --  2,440.00 3,417.00  HGB 13.4 13.0* 13.0*  HCT 41.2 39* 39*  MCV 94.1  --   --   PLT 184 172 235   Lab Results  Component Value Date   TSH 0.28 (A) 09/26/2022   Lab Results  Component Value Date   HGBA1C 6.4 09/26/2022   Lab Results  Component Value Date   CHOL 151 08/12/2022   HDL 39 08/12/2022   LDLCALC 94 08/12/2022   TRIG 87 08/12/2022   CHOLHDL 2.7 02/09/2022    Significant Diagnostic Results in last 30 days:  No results found.  Assessment/Plan 1. Primary hypertension ***  2. Chronic low back pain without sciatica, unspecified back pain laterality ***  3. Stage 3b chronic kidney disease (HCC) ***  4. Gastroesophageal reflux disease without esophagitis ***  5. Primary osteoarthritis involving multiple joints ***  6. Prediabetes ***    Iya Hamed K. Biagio [facility] [phone]

## 2023-04-10 LAB — CBC: RBC: 4.22 (ref 3.87–5.11)

## 2023-04-10 LAB — COMPREHENSIVE METABOLIC PANEL WITH GFR
Albumin: 4 (ref 3.5–5.0)
Calcium: 8.8 (ref 8.7–10.7)
Globulin: 2.2
eGFR: 30

## 2023-04-10 LAB — CBC AND DIFFERENTIAL
HCT: 39 — AB (ref 41–53)
Hemoglobin: 12.7 — AB (ref 13.5–17.5)
Neutrophils Absolute: 2184
Platelets: 188 10*3/uL (ref 150–400)
WBC: 5.6

## 2023-04-10 LAB — BASIC METABOLIC PANEL WITH GFR
BUN: 35 — AB (ref 4–21)
CO2: 25 — AB (ref 13–22)
Chloride: 109 — AB (ref 99–108)
Creatinine: 2 — AB (ref 0.6–1.3)
Glucose: 87
Potassium: 4.4 meq/L (ref 3.5–5.1)
Sodium: 141 (ref 137–147)

## 2023-04-10 LAB — HEPATIC FUNCTION PANEL
ALT: 21 U/L (ref 10–40)
AST: 16 (ref 14–40)
Alkaline Phosphatase: 61 (ref 25–125)
Bilirubin, Total: 0.6

## 2023-04-10 LAB — HEMOGLOBIN A1C: Hemoglobin A1C: 6.6

## 2023-04-10 LAB — TSH: TSH: 0.24 — AB (ref 0.41–5.90)

## 2023-07-07 ENCOUNTER — Non-Acute Institutional Stay: Payer: Medicare Other | Admitting: Student

## 2023-07-07 DIAGNOSIS — I10 Essential (primary) hypertension: Secondary | ICD-10-CM | POA: Diagnosis not present

## 2023-07-07 DIAGNOSIS — K219 Gastro-esophageal reflux disease without esophagitis: Secondary | ICD-10-CM

## 2023-07-07 DIAGNOSIS — N1832 Chronic kidney disease, stage 3b: Secondary | ICD-10-CM

## 2023-07-07 DIAGNOSIS — F03B0 Unspecified dementia, moderate, without behavioral disturbance, psychotic disturbance, mood disturbance, and anxiety: Secondary | ICD-10-CM | POA: Diagnosis not present

## 2023-07-10 ENCOUNTER — Encounter: Payer: Self-pay | Admitting: Student

## 2023-07-10 NOTE — Progress Notes (Signed)
Location:  Other   Place of Service:  ALF (13) Provider:  Judeth Horn, MD  Patient Care Team: Earnestine Mealing, MD as PCP - General (Family Medicine) Sharon Seller, NP as Nurse Practitioner (Geriatric Medicine) Iran Ouch, MD as Consulting Physician (Cardiology)  Extended Emergency Contact Information Primary Emergency Contact: Amanda Cockayne [place] Home Phone: [phone] Mobile Phone: [phone] Relation: Daughter Secondary Emergency Contact: Waldon Merl [place] Mobile Phone: [phone] Relation: Daughter  Code Status:  DNR Goals of care: Advanced Directive information    04/08/2023    2:46 PM  Advanced Directives  Does Patient Have a Medical Advance Directive? Yes  Type of Advance Directive Out of facility DNR (pink MOST or yellow form)  Does patient want to make changes to medical advance directive? No - Patient declined     Chief Complaint  Patient presents with   Medical Management of Chronic Conditions    HPI:  Frederick Simon is a 89 y.o. male seen today for medical management of chronic diseases.   Patient was resting. He states he is healthy as a horse. Feels well. Denies chest pain. Recent falls. Eating well. Has arthritis which continues to give him issues for which he takes tylenol and occasional ibuprofen. No other concerns at this time.   Per Nursing notes, there have been some recent signs of cognitive decline. He woke up at 12AM ready to have lunch thinking it was 12 PM. There have been discussions regarding the appropriate level of care based on his current cognitive function.  Past Medical History:  Diagnosis Date   Acute hypoxemic respiratory failure (HCC) 02/10/2022   AKI (acute kidney injury) (HCC) 02/09/2022   Allergic rhinitis    CAP (community acquired pneumonia) 02/09/2022   Gastroesophageal reflux disease    Hyperlipidemia    Hypertension    Nausea & vomiting 02/11/2022   Severe  sepsis (HCC) 02/10/2022   Past Surgical History:  Procedure Laterality Date   BACK SURGERY  May 1975    Dr. Hilton Cork SURGERY  Feb 2004   Dr. Newell Coral    HAND SURGERY  [phone]   Dr. Priscille Kluver   NOSE SURGERY  1989   Dr. Ned Grace SURGERY  [phone]   Dr. Priscille Kluver    Allergies  Allergen Reactions   Dust Mite Extract Other (See Comments)    sneeze   Wool Alcohol [Lanolin] Itching    Outpatient Encounter Medications as of 07/07/2023  Medication Sig   acetaminophen (TYLENOL) 325 MG tablet Take 650 mg by mouth every 4 (four) hours as needed.   acetaminophen (TYLENOL) 500 MG tablet Take 1,000 mg by mouth every 8 (eight) hours as needed for mild pain or moderate pain.   albuterol (VENTOLIN HFA) 108 (90 Base) MCG/ACT inhaler Inhale 1 puff into the lungs every 4 (four) hours as needed for wheezing or shortness of breath.   alum & mag hydroxide-simeth (MAALOX/MYLANTA) 200-200-20 MG/5ML suspension ) Give 2 Tbsp by mouth every 4 hours as needed for gas, indigestion, or upset stomach supervised self-administration Notify MD if no relief.   amLODipine (NORVASC) 5 MG tablet Take 5 mg by mouth daily.   bismuth subsalicylate (PEPTO BISMOL) 262 MG/15ML suspension Give 10cc by mouth as needed for loose stool   calcium carbonate (TUMS - DOSED IN MG ELEMENTAL CALCIUM) 500 MG chewable tablet Chew 1 tablet by mouth every 6 (six) hours as needed for indigestion or heartburn.   carbamide peroxide (  DEBROX) 6.5 % OTIC solution Place 5 drops into both ears as needed (for ear wax).   Cholecalciferol (VITAMIN D) 50 MCG (2000 UT) CAPS Give one capsule by mouth once daily.   diclofenac Sodium (VOLTAREN) 1 % GEL Apply to lower back topically every 12 hours as needed for pain.   Glucose 15 GM/32ML GEL Give 1 packet by mouth as needed for low blood sugar.   lidocaine 4 % Place 1 patch onto the skin. Apply to lower back topically as needed for pain, remove after 12 hrs.   losartan (COZAAR) 100 MG  tablet Take 100 mg by mouth daily.   magnesium hydroxide (MILK OF MAGNESIA) 400 MG/5ML suspension Give 2 Tbsp by mouth as needed for constipation. supervised self-administration daily  Call MD if no relief in 3 days of continued use.   nystatin (MYCOSTATIN/NYSTOP) powder Apply 1 Application topically 2 (two) times daily.   ondansetron (ZOFRAN) 4 MG tablet Take 4 mg by mouth 3 (three) times daily as needed for nausea or vomiting.   OXYGEN 2lmp for dyspnea or SOB   polyethylene glycol (MIRALAX / GLYCOLAX) 17 g packet Take 17 g by mouth daily as needed.   pseudoephedrine-dextromethorphan-guaifenesin (ROBITUSSIN-PE) 30-10-100 MG/5ML solution Take 10 mLs by mouth every 4 (four) hours as needed for cough.   sennosides-docusate sodium (SENOKOT-S) 8.6-50 MG tablet Take 2 tablets by mouth at bedtime.   simvastatin (ZOCOR) 20 MG tablet Take 20 mg by mouth daily.   triamcinolone cream (KENALOG) 0.1 % Apply 1 Application topically 2 (two) times daily as needed.   vitamin B-12 (CYANOCOBALAMIN) 1000 MCG tablet Take 1,000 mcg by mouth daily.   No facility-administered encounter medications on file as of 07/07/2023.    Review of Systems  Immunization History  Administered Date(s) Administered   Fluad Trivalent(High Dose 65+) 03/28/2023   Influenza-Unspecified 03/27/2021, 03/27/2022   Moderna Covid-19 Fall Seasonal Vaccine 48yrs & older 09/17/2022   Moderna Sars-Covid-2 Vaccination 07/13/2019, 08/12/2019, 11/06/2021, 04/19/2022, 03/07/2023   Pneumococcal Conjugate-13 06/10/2013   Pneumococcal Polysaccharide-23 06/11/2007, 11/17/2017   Tdap 12/25/2012   Unspecified SARS-COV-2 Vaccination 04/02/2020, 10/27/2020, 03/02/2021   Zoster Recombinant(Shingrix) 09/20/2009, 11/12/2022   Pertinent  Health Maintenance Due  Topic Date Due   INFLUENZA VACCINE  Completed      02/13/2022   11:00 AM 02/13/2022    9:30 PM 02/14/2022   10:00 AM 02/14/2022    7:30 PM 02/15/2022    7:38 AM  Fall Risk  (RETIRED) Patient  Fall Risk Level High fall risk High fall risk High fall risk High fall risk High fall risk   Functional Status Survey:    Vitals:   07/07/23 2047  BP: 124/74  Pulse: 67  Resp: 17  Temp: (!) 97.5 F (36.4 C)  SpO2: 97%  Weight: 196 lb (88.9 kg)   Body mass index is 28.12 kg/m. Physical Exam Constitutional:      Appearance: Normal appearance.  Cardiovascular:     Rate and Rhythm: Normal rate and regular rhythm.     Pulses: Normal pulses.     Heart sounds: Normal heart sounds.  Pulmonary:     Effort: Pulmonary effort is normal.  Abdominal:     General: Abdomen is flat. Bowel sounds are normal.     Palpations: Abdomen is soft.  Musculoskeletal:        General: No swelling or tenderness.  Skin:    General: Skin is warm and dry.     Capillary Refill: Capillary refill takes 2 to 3  seconds.  Neurological:     Mental Status: He is alert.     Gait: Gait normal.     Comments: Oriented to self and place, not time.  Psychiatric:        Mood and Affect: Mood normal.     Labs reviewed: Recent Labs    08/01/22 2022 08/12/22 0000 09/26/22 0000  NA 140 143 139  K 4.0 4.2 4.4  CL 109 109* 109*  CO2 22 26* 24*  GLUCOSE 123*  --   --   BUN 37* 33* 27*  CREATININE 2.06* 1.9* 1.9*  CALCIUM 8.9 8.6* 9.1   Recent Labs    08/12/22 0000 09/26/22 0000  AST 16 16  ALT 15 16  ALKPHOS 58 55  ALBUMIN 3.9 3.9   Recent Labs    08/01/22 2022 08/12/22 0000 09/26/22 0000  WBC 6.9 5.7 6.7  NEUTROABS  --  2,440.00 3,417.00  HGB 13.4 13.0* 13.0*  HCT 41.2 39* 39*  MCV 94.1  --   --   PLT 184 172 235   Lab Results  Component Value Date   TSH 0.28 (A) 09/26/2022   Lab Results  Component Value Date   HGBA1C 6.4 09/26/2022   Lab Results  Component Value Date   CHOL 151 08/12/2022   HDL 39 08/12/2022   LDLCALC 94 08/12/2022   TRIG 87 08/12/2022   CHOLHDL 2.7 02/09/2022    Significant Diagnostic Results in last 30 days:  No results found.  Assessment/Plan Primary  hypertension  Stage 3b chronic kidney disease (HCC), Chronic  Gastroesophageal reflux disease without esophagitis  Moderate dementia without behavioral disturbance, psychotic disturbance, mood disturbance, or anxiety, unspecified dementia type [facility]) Patient is doing well and remains independent in assisted living. He is able to perform ADLs, however, does receive some support with medication management. At this juncture, patient has not had an event concerning for his safety, however, he may require additional cuing to continue with his current level of independence. Some concern patient should have a medication at night to help maintain his sleep-wake cycle.   Family/ staff Communication: nursing  Labs/tests ordered:  none

## 2023-07-14 LAB — BASIC METABOLIC PANEL WITH GFR
BUN: 28 — AB (ref 4–21)
CO2: 25 — AB (ref 13–22)
Chloride: 108 (ref 99–108)
Creatinine: 1.9 — AB (ref 0.6–1.3)
Glucose: 132
Potassium: 4.5 meq/L (ref 3.5–5.1)
Sodium: 139 (ref 137–147)

## 2023-07-14 LAB — COMPREHENSIVE METABOLIC PANEL WITH GFR: Calcium: 9.3 (ref 8.7–10.7)

## 2023-07-14 LAB — TSH: TSH: 0.44 (ref 0.41–5.90)

## 2023-07-14 LAB — VITAMIN D 25 HYDROXY (VIT D DEFICIENCY, FRACTURES): Vit D, 25-Hydroxy: 28

## 2023-08-11 LAB — BASIC METABOLIC PANEL WITH GFR
BUN: 29 — AB (ref 4–21)
CO2: 27 — AB (ref 13–22)
Chloride: 108 (ref 99–108)
Creatinine: 1.8 — AB (ref 0.6–1.3)
Glucose: 96
Potassium: 4.5 meq/L (ref 3.5–5.1)
Sodium: 140 (ref 137–147)

## 2023-08-11 LAB — COMPREHENSIVE METABOLIC PANEL WITH GFR: Calcium: 9 (ref 8.7–10.7)

## 2023-09-18 ENCOUNTER — Non-Acute Institutional Stay: Payer: Self-pay | Admitting: Nurse Practitioner

## 2023-09-18 ENCOUNTER — Encounter: Payer: Self-pay | Admitting: Nurse Practitioner

## 2023-09-18 DIAGNOSIS — N1832 Chronic kidney disease, stage 3b: Secondary | ICD-10-CM | POA: Diagnosis not present

## 2023-09-18 DIAGNOSIS — E7800 Pure hypercholesterolemia, unspecified: Secondary | ICD-10-CM

## 2023-09-18 DIAGNOSIS — I10 Essential (primary) hypertension: Secondary | ICD-10-CM

## 2023-09-18 DIAGNOSIS — K5901 Slow transit constipation: Secondary | ICD-10-CM

## 2023-09-18 DIAGNOSIS — R0781 Pleurodynia: Secondary | ICD-10-CM

## 2023-09-18 DIAGNOSIS — K219 Gastro-esophageal reflux disease without esophagitis: Secondary | ICD-10-CM | POA: Diagnosis not present

## 2023-09-18 DIAGNOSIS — M545 Low back pain, unspecified: Secondary | ICD-10-CM

## 2023-09-18 DIAGNOSIS — R7303 Prediabetes: Secondary | ICD-10-CM

## 2023-09-18 DIAGNOSIS — F03B0 Unspecified dementia, moderate, without behavioral disturbance, psychotic disturbance, mood disturbance, and anxiety: Secondary | ICD-10-CM

## 2023-09-18 DIAGNOSIS — G8929 Other chronic pain: Secondary | ICD-10-CM

## 2023-09-18 NOTE — Progress Notes (Signed)
 Location:  Other ([facility])   Place of Service:  [facility] ([facility])  Earnestine Mealing, MD  Patient Care Team: Earnestine Mealing, MD as PCP - General (Family Medicine) Sharon Seller, NP as Nurse Practitioner (Geriatric Medicine) Iran Ouch, MD as Consulting Physician (Cardiology)  Extended Emergency Contact Information Primary Emergency Contact: Amanda Cockayne [place] Home Phone: [phone] Mobile Phone: [phone] Relation: Daughter Secondary Emergency Contact: Waldon Merl [place] Mobile Phone: [phone] Relation: Daughter  Goals of care: Advanced Directive information    04/08/2023    2:46 PM  Advanced Directives  Does Patient Have a Medical Advance Directive? Yes  Type of Advance Directive Out of facility DNR (pink MOST or yellow form)  Does patient want to make changes to medical advance directive? No - Patient declined     Chief Complaint  Patient presents with   Medical Management of Chronic Issues    And rib pain for 3 days     HPI:  Pt is a 89 y.o. male seen today for medical management of chronic disease.  Nursing notes that he has had increase pain to right ribs over the last 3 days.  Pt denies pain at this time however reports he does have pain with certain movements. Denies falls or injury. No rash or sores noted.  Does not remember exactly when or how pain started.  He is still able to dress independently with dressing assist.  He denies cough, shortness of breath No chest pains.  No constipation or diarrhea.  Reports good appetite.     Past Medical History:  Diagnosis Date   Acute hypoxemic respiratory failure (HCC) 02/10/2022   AKI (acute kidney injury) (HCC) 02/09/2022   Allergic rhinitis    CAP (community acquired pneumonia) 02/09/2022   Gastroesophageal reflux disease    Hyperlipidemia    Hypertension    Nausea & vomiting 02/11/2022   Severe sepsis (HCC) 02/10/2022   Past Surgical History:   Procedure Laterality Date   BACK SURGERY  May 1975    Dr. Hilton Cork SURGERY  Feb 2004   Dr. Newell Coral    HAND SURGERY  [id number]   Dr. Priscille Kluver   NOSE SURGERY  1989   Dr. Ned Grace SURGERY  [phone]   Dr. Priscille Kluver    Allergies  Allergen Reactions   Dust Mite Extract Other (See Comments)    sneeze   Wool Alcohol [Lanolin] Itching    Outpatient Encounter Medications as of 09/18/2023  Medication Sig   acetaminophen (TYLENOL) 325 MG tablet Take 650 mg by mouth. Twice daily scheduled and can have additional 650 mg every 8 hours PRN   albuterol (VENTOLIN HFA) 108 (90 Base) MCG/ACT inhaler Inhale 1 puff into the lungs every 4 (four) hours as needed for wheezing or shortness of breath.   amLODipine (NORVASC) 5 MG tablet Take 5 mg by mouth daily.   bismuth subsalicylate (PEPTO BISMOL) 262 MG/15ML suspension Give 10cc by mouth as needed for loose stool   calcium carbonate (TUMS - DOSED IN MG ELEMENTAL CALCIUM) 500 MG chewable tablet Chew 1 tablet by mouth every 6 (six) hours as needed for indigestion or heartburn.   carbamide peroxide (DEBROX) 6.5 % OTIC solution Place 5 drops into both ears as needed (for ear wax).   Cholecalciferol (VITAMIN D) 50 MCG (2000 UT) CAPS Give one capsule by mouth once daily.   diclofenac Sodium (VOLTAREN) 1 % GEL Apply to lower back topically every 12 hours  as needed for pain.   Glucose 15 GM/32ML GEL Give 1 packet by mouth as needed for low blood sugar.   lidocaine 4 % Place 1 patch onto the skin. Apply to lower back topically as needed for pain, remove after 12 hrs.   losartan (COZAAR) 100 MG tablet Take 100 mg by mouth daily.   magnesium hydroxide (MILK OF MAGNESIA) 400 MG/5ML suspension Give 2 Tbsp by mouth as needed for constipation. supervised self-administration daily  Call MD if no relief in 3 days of continued use.   nystatin (MYCOSTATIN/NYSTOP) powder Apply 1 Application topically 2 (two) times daily.   ondansetron (ZOFRAN) 4 MG  tablet Take 4 mg by mouth 3 (three) times daily as needed for nausea or vomiting.   OXYGEN 2lmp for dyspnea or SOB   polyethylene glycol (MIRALAX / GLYCOLAX) 17 g packet Take 17 g by mouth daily as needed.   pseudoephedrine-dextromethorphan-guaifenesin (ROBITUSSIN-PE) 30-10-100 MG/5ML solution Take 10 mLs by mouth every 4 (four) hours as needed for cough.   sennosides-docusate sodium (SENOKOT-S) 8.6-50 MG tablet Take 1 tablet by mouth at bedtime.   simvastatin (ZOCOR) 20 MG tablet Take 20 mg by mouth daily.   triamcinolone cream (KENALOG) 0.1 % Apply 1 Application topically 2 (two) times daily as needed.   vitamin B-12 (CYANOCOBALAMIN) 1000 MCG tablet Take 1,000 mcg by mouth daily.   [DISCONTINUED] acetaminophen (TYLENOL) 500 MG tablet Take 1,000 mg by mouth every 8 (eight) hours as needed for mild pain or moderate pain.   [DISCONTINUED] alum & mag hydroxide-simeth (MAALOX/MYLANTA) 200-200-20 MG/5ML suspension ) Give 2 Tbsp by mouth every 4 hours as needed for gas, indigestion, or upset stomach supervised self-administration Notify MD if no relief.   No facility-administered encounter medications on file as of 09/18/2023.    Review of Systems  Constitutional:  Negative for activity change, appetite change, fatigue and unexpected weight change.  HENT:  Positive for hearing loss. Negative for congestion.   Eyes: Negative.   Respiratory:  Negative for cough and shortness of breath.   Cardiovascular:  Negative for chest pain, palpitations and leg swelling.  Gastrointestinal:  Negative for abdominal pain, constipation and diarrhea.  Genitourinary:  Negative for difficulty urinating and dysuria.  Musculoskeletal:  Negative for arthralgias and myalgias.       Tenderness to ribs on right side   Skin:  Negative for color change and wound.  Neurological:  Negative for dizziness and weakness.  Psychiatric/Behavioral:  Negative for agitation, behavioral problems and confusion.      Immunization  History  Administered Date(s) Administered   Fluad Trivalent(High Dose 65+) 03/28/2023   Influenza-Unspecified 03/27/2021, 03/27/2022   Moderna Covid-19 Fall Seasonal Vaccine 44yrs & older 09/17/2022   Moderna Sars-Covid-2 Vaccination 07/13/2019, 08/12/2019, 11/06/2021, 04/19/2022, 03/07/2023   Pneumococcal Conjugate-13 06/10/2013   Pneumococcal Polysaccharide-23 06/11/2007, 11/17/2017   Tdap 12/25/2012   Unspecified SARS-COV-2 Vaccination 04/02/2020, 10/27/2020, 03/02/2021   Zoster Recombinant(Shingrix) 09/20/2009, 11/12/2022   Pertinent  Health Maintenance Due  Topic Date Due   INFLUENZA VACCINE  01/09/2024      02/13/2022   11:00 AM 02/13/2022    9:30 PM 02/14/2022   10:00 AM 02/14/2022    7:30 PM 02/15/2022    7:38 AM  Fall Risk  (RETIRED) Patient Fall Risk Level High fall risk High fall risk High fall risk High fall risk High fall risk   Functional Status Survey:    Vitals:   09/18/23 1504 09/18/23 1556  BP: (!) 159/75 125/80  Pulse: 71  Resp: 20   SpO2: 97%    There is no height or weight on file to calculate BMI. Physical Exam Constitutional:      General: He is not in acute distress.    Appearance: He is well-developed. He is not diaphoretic.  HENT:     Head: Normocephalic and atraumatic.     Right Ear: External ear normal.     Left Ear: External ear normal.     Mouth/Throat:     Pharynx: No oropharyngeal exudate.  Eyes:     Conjunctiva/sclera: Conjunctivae normal.     Pupils: Pupils are equal, round, and reactive to light.  Cardiovascular:     Rate and Rhythm: Normal rate and regular rhythm.     Heart sounds: Normal heart sounds.  Pulmonary:     Effort: Pulmonary effort is normal.     Breath sounds: Normal breath sounds.  Chest:       Comments: Tenderness to right side of ribs, worse with movement No rash or bruising noted.  Abdominal:     General: Bowel sounds are normal.     Palpations: Abdomen is soft.  Musculoskeletal:        General: No  tenderness.     Cervical back: Normal range of motion and neck supple.     Right lower leg: No edema.     Left lower leg: No edema.  Skin:    General: Skin is warm and dry.  Neurological:     Mental Status: He is alert and oriented to person, place, and time.     Labs reviewed: Recent Labs    09/26/22 0000  NA 139  K 4.4  CL 109*  CO2 24*  BUN 27*  CREATININE 1.9*  CALCIUM 9.1   Recent Labs    09/26/22 0000  AST 16  ALT 16  ALKPHOS 55  ALBUMIN 3.9   Recent Labs    09/26/22 0000  WBC 6.7  NEUTROABS 3,417.00  HGB 13.0*  HCT 39*  PLT 235   Lab Results  Component Value Date   TSH 0.28 (A) 09/26/2022   Lab Results  Component Value Date   HGBA1C 6.4 09/26/2022   Lab Results  Component Value Date   CHOL 151 08/12/2022   HDL 39 08/12/2022   LDLCALC 94 08/12/2022   TRIG 87 08/12/2022   CHOLHDL 2.7 02/09/2022    Significant Diagnostic Results in last 30 days:  No results found.  Assessment/Plan 1. Stage 3b chronic kidney disease (HCC) (Primary) -Chronic and stable Encourage proper hydration Follow metabolic panel Avoid nephrotoxic meds (NSAIDS)  2. Primary hypertension -Blood pressure well controlled, goal bp <140/90 Continue current medications and dietary modifications follow metabolic panel  3. Gastroesophageal reflux disease without esophagitis Stable, no worsening of symptoms  4. Pure hypercholesterolemia Continues on zocor, update lipid panel with next labs  5. Prediabetes Last A1c of October 2024 Update A1c with next labs  6. Slow transit constipation -controlled on current regimen.   7. Chronic low back pain without sciatica, unspecified back pain laterality -stable on current medication, continues with lidocaine patch PRN and tylenol scheduled   8. Moderate dementia without behavioral disturbance, psychotic disturbance, mood disturbance, or anxiety, unspecified dementia type (HCC) -Stable, no acute changes in cognitive or  functional status, continue supportive care.   9. Rib pain on right side No injury noted, reports no pain unless moving and is tolerable. Does not feel like it is bad enough to take medication. Has PRN tylenol if  needed Staff will monitor and notify if worsen.    Janene Harvey. [facility] [phone]

## 2023-09-22 LAB — LIPID PANEL
Cholesterol: 125 (ref 0–200)
HDL: 37 (ref 35–70)
LDL Cholesterol: 69
Triglycerides: 108 (ref 40–160)

## 2023-09-22 LAB — CBC AND DIFFERENTIAL
HCT: 41 (ref 41–53)
Hemoglobin: 13.4 — AB (ref 13.5–17.5)
Neutrophils Absolute: 1799
Platelets: 202 10*3/uL (ref 150–400)
WBC: 5.2

## 2023-09-22 LAB — HEMOGLOBIN A1C: Hemoglobin A1C: 6.4

## 2023-09-22 LAB — CBC: RBC: 4.32 (ref 3.87–5.11)

## 2023-10-06 ENCOUNTER — Telehealth: Payer: Self-pay | Admitting: Pharmacist

## 2023-10-06 DIAGNOSIS — E7800 Pure hypercholesterolemia, unspecified: Secondary | ICD-10-CM

## 2023-10-09 NOTE — Progress Notes (Signed)
   10/09/2023  Patient ID: Frederick Simon, male   DOB: 11-27-1926, 89 y.o.   MRN: [id number]  Pharmacy Quality Measure Review  This patient is appearing on a report for being at risk of failing the adherence measure for cholesterol (statin) and hypertension (ACEi/ARB) medications this calendar year.   Medication: Amlodipine 5 mg Simvastatin  20 mg   Last fill date: 09/01/23 for 30 day supply  Patient gets medications filled at [facility].  It is not clear how they are actually billing the Patient's medications as they are not showing up in Dr. Anson Basta.  They are filling meds in one week cycle packs.  A message was sent to Jerri Morale, PharmD to inquire about how Nursing Home Pharmacy biling affects the medication adherence measure.  Geronimo Krabbe, PharmD, BCACP Clinical Pharmacist [phone]

## 2023-12-04 ENCOUNTER — Encounter: Payer: Self-pay | Admitting: Nurse Practitioner

## 2023-12-04 ENCOUNTER — Non-Acute Institutional Stay: Payer: Self-pay | Admitting: Nurse Practitioner

## 2023-12-04 DIAGNOSIS — Z0000 Encounter for general adult medical examination without abnormal findings: Secondary | ICD-10-CM | POA: Diagnosis not present

## 2023-12-04 NOTE — Progress Notes (Signed)
 Subjective:   Frederick Simon is a 89 y.o. male who presents for Medicare Annual/Subsequent preventive examination.  Visit Complete: In person TL AL    Cardiac Risk Factors include: hypertension;dyslipidemia;advanced age (>24men, >70 women);male gender;sedentary lifestyle     Objective:    Today's Vitals   12/04/23 0941 12/04/23 1156  BP: 117/68   Pulse: 66   Resp: 16   Temp: 97.7 F (36.5 C)   SpO2: 91%   Weight: 190 lb (86.2 kg)   Height: 5' 10 (1.778 m)   PainSc:  2    Body mass index is 27.26 kg/m.     12/04/2023   10:00 AM 04/08/2023    2:46 PM 12/18/2022    9:30 AM 11/29/2022    9:44 AM 11/06/2022   11:46 AM 09/24/2022   11:27 AM 08/28/2022   10:06 AM  Advanced Directives  Does Patient Have a Medical Advance Directive? Yes Yes Yes  Yes Yes Yes  Type of Advance Directive Out of facility DNR (pink MOST or yellow form) Out of facility DNR (pink MOST or yellow form) Out of facility DNR (pink MOST or yellow form) Healthcare Power of [place];Out of facility DNR (pink MOST or yellow form) Healthcare Power of [place];Out of facility DNR (pink MOST or yellow form) Out of facility DNR (pink MOST or yellow form) Out of facility DNR (pink MOST or yellow form)  Does patient want to make changes to medical advance directive? No - Patient declined No - Patient declined No - Patient declined  No - Patient declined No - Patient declined No - Patient declined  Copy of Healthcare Power of Attorney in Chart?    Yes - validated most recent copy scanned in chart (See row information) Yes - validated most recent copy scanned in chart (See row information)    Pre-existing out of facility DNR order (yellow form or pink MOST form)     Yellow form placed in chart (order not valid for inpatient use)      Current Medications (verified) Outpatient Encounter Medications as of 12/04/2023  Medication Sig   acetaminophen  (TYLENOL ) 650 MG CR tablet Take 650 mg by mouth every 8 (eight) hours as  needed for pain. Give one tablet by mouth twice daily.   albuterol  (VENTOLIN  HFA) 108 (90 Base) MCG/ACT inhaler Inhale 1 puff into the lungs every 4 (four) hours as needed for wheezing or shortness of breath.   amLODipine (NORVASC) 5 MG tablet Take 5 mg by mouth daily.   bismuth subsalicylate (PEPTO BISMOL) 262 MG/15ML suspension Give 10cc by mouth as needed for loose stool   calcium carbonate (TUMS - DOSED IN MG ELEMENTAL CALCIUM) 500 MG chewable tablet Chew 1 tablet by mouth every 6 (six) hours as needed for indigestion or heartburn.   carbamide peroxide (DEBROX) 6.5 % OTIC solution Place 5 drops into both ears as needed (for ear wax).   Cholecalciferol  (VITAMIN D ) 50 MCG (2000 UT) CAPS Give one capsule by mouth once daily.   diclofenac Sodium (VOLTAREN) 1 % GEL Apply to lower back topically every 12 hours as needed for pain.   Glucose 15 GM/32ML GEL Give 1 packet by mouth as needed for low blood sugar.   lidocaine  4 % Place 1 patch onto the skin. Apply to lower back topically as needed for pain, remove after 12 hrs.   losartan  (COZAAR ) 100 MG tablet Take 100 mg by mouth daily.   magnesium hydroxide (MILK OF MAGNESIA) 400 MG/5ML suspension Give 2  Tbsp by mouth as needed for constipation. supervised self-administration daily  Call MD if no relief in 3 days of continued use.   Multiple Vitamins-Minerals (PRESERVISION AREDS PO) Take 1 tablet by mouth daily.   nystatin (MYCOSTATIN/NYSTOP) powder Apply 1 Application topically 2 (two) times daily.   ondansetron  (ZOFRAN ) 4 MG tablet Take 4 mg by mouth 3 (three) times daily as needed for nausea or vomiting.   OXYGEN 2lmp for dyspnea or SOB   polyethylene glycol (MIRALAX  / GLYCOLAX ) 17 g packet Take 17 g by mouth daily as needed.   pseudoephedrine-dextromethorphan -guaifenesin  (ROBITUSSIN-PE) 30-10-100 MG/5ML solution Take 10 mLs by mouth every 4 (four) hours as needed for cough.   sennosides-docusate sodium  (SENOKOT-S) 8.6-50 MG tablet Take 1 tablet by  mouth at bedtime.   simvastatin  (ZOCOR ) 20 MG tablet Take 20 mg by mouth daily.   triamcinolone cream (KENALOG) 0.1 % Apply 1 Application topically 2 (two) times daily as needed.   vitamin B-12 (CYANOCOBALAMIN ) 1000 MCG tablet Take 1,000 mcg by mouth daily.   [DISCONTINUED] acetaminophen  (TYLENOL ) 325 MG tablet Take 650 mg by mouth. Twice daily scheduled and can have additional 650 mg every 8 hours PRN   No facility-administered encounter medications on file as of 12/04/2023.    Allergies (verified) Dust mite extract and Wool alcohol [lanolin]   History: Past Medical History:  Diagnosis Date   Acute hypoxemic respiratory failure (HCC) 02/10/2022   AKI (acute kidney injury) (HCC) 02/09/2022   Allergic rhinitis    CAP (community acquired pneumonia) 02/09/2022   Gastroesophageal reflux disease    Hyperlipidemia    Hypertension    Nausea & vomiting 02/11/2022   Severe sepsis (HCC) 02/10/2022   Past Surgical History:  Procedure Laterality Date   BACK SURGERY  May 1975    Dr. Carlos SANES SURGERY  Feb 2004   Dr. Alix    HAND SURGERY  [id number]   Dr. Lucious   NOSE SURGERY  1989   Dr. Jerilynn ROYS SURGERY  [phone]   Dr. Lucious   Family History  Problem Relation Age of Onset   Cancer Mother    Hypertension Mother    Hypertension Father    Stroke Father    Lupus Sister    Social History   Socioeconomic History   Marital status: Widowed    Spouse name: Not on file   Number of children: 4   Years of education: Not on file   Highest education level: Not on file  Occupational History   Occupation: Geologist, engineering: [employer]  Tobacco Use   Smoking status: Former   Smokeless tobacco: Never   Tobacco comments:    quit 1980's  Vaping Use   Vaping status: Never Used  Substance and Sexual Activity   Alcohol use: No   Drug use: No   Sexual activity: Not on file  Other Topics Concern   Not on file  Social History Narrative   4 daughters       Has advanced directives   Daughter [place] is health care POA   Requests DNR--done 08/03/20   No feeding tube if cognitively unaware   Social Drivers of Health   Financial Resource Strain: Not on file  Food Insecurity: Not on file  Transportation Needs: Not on file  Physical Activity: Not on file  Stress: Not on file  Social Connections: Not on file    Tobacco Counseling Counseling given: Not Answered Tobacco comments: quit 1980's  Clinical Intake:  Pre-visit preparation completed: Yes  Pain : 0-10 Pain Score: 2  Pain Type: Chronic pain Pain Location: Back Pain Orientation: Lower Pain Descriptors / Indicators: Aching Pain Onset: More than a month ago Pain Frequency: Constant     BMI - recorded: 27 Diabetes: No  How often do you need to have someone help you when you read instructions, pamphlets, or other written materials from your doctor or pharmacy?: 1 - Never         Activities of Daily Living    12/04/2023   11:49 AM  In your present state of health, do you have any difficulty performing the following activities:  Hearing? 1  Vision? 0  Difficulty concentrating or making decisions? 1  Walking or climbing stairs? 1  Dressing or bathing? 0  Doing errands, shopping? 1  Comment does not drive  Preparing Food and eating ? N  Using the Toilet? N  In the past six months, have you accidently leaked urine? N  Do you have problems with loss of bowel control? N  Managing your Medications? Y  Managing your Finances? N  Comment daughters help  Housekeeping or managing your Housekeeping? N    Patient Care Team: Abdul Fine, MD as PCP - General (Family Medicine) [name], NP as Nurse Practitioner (Geriatric Medicine) Darron Deatrice LABOR, MD as Consulting Physician (Cardiology)  Indicate any recent Medical Services you may have received from other than [facility] providers in the past year (date may be approximate).     Assessment:   This is  a routine wellness examination for Gwendolyn.  Hearing/Vision screen No results found.   Goals Addressed   None    Depression Screen    12/04/2023   11:58 AM  PHQ 2/9 Scores  PHQ - 2 Score 0    Fall Risk    12/04/2023   11:58 AM  Fall Risk   Falls in the past year? 0  Number falls in past yr: 0  Injury with Fall? 0  Risk for fall due to : History of fall(s)  Follow up Falls evaluation completed    MEDICARE RISK AT HOME: Medicare Risk at Home Any stairs in or around the home?: No Home free of loose throw rugs in walkways, pet beds, electrical cords, etc?: Yes Adequate lighting in your home to reduce risk of falls?: Yes Life alert?: Yes Use of a cane, walker or w/c?: Yes Grab bars in the bathroom?: Yes Shower chair or bench in shower?: Yes Elevated toilet seat or a handicapped toilet?: Yes  TIMED UP AND GO:  Was the test performed?  No    Cognitive Function:        Immunizations Immunization History  Administered Date(s) Administered   Fluad Trivalent(High Dose 65+) 03/28/2023   Influenza-Unspecified 03/27/2021, 03/27/2022   Moderna Covid-19 Fall Seasonal Vaccine 22yrs & older 09/17/2022   Moderna Sars-Covid-2 Vaccination 07/13/2019, 08/12/2019, 11/06/2021, 04/19/2022, 03/07/2023   Pneumococcal Conjugate-13 06/10/2013   Pneumococcal Polysaccharide-23 06/11/2007, 11/17/2017   Tdap 12/25/2012   Unspecified SARS-COV-2 Vaccination 04/02/2020, 10/27/2020, 03/02/2021, 09/19/2023   Zoster Recombinant(Shingrix) 09/20/2009, 11/12/2022    TDAP status: Due, Education has been provided regarding the importance of this vaccine. Advised may receive this vaccine at local pharmacy or Health Dept. Aware to provide a copy of the vaccination record if obtained from local pharmacy or Health Dept. Verbalized acceptance and understanding.  Flu Vaccine status: Up to date  Pneumococcal vaccine status: Up to date  Covid-19 vaccine status: Information  provided on how to obtain  vaccines.   Qualifies for Shingles Vaccine? Yes   Zostavax completed No   Shingrix Completed?: Yes  Screening Tests Health Maintenance  Topic Date Due   DTaP/Tdap/Td (2 - Td or Tdap) 12/26/2022   INFLUENZA VACCINE  01/09/2024   COVID-19 Vaccine (11 - 2024-25 season) 03/20/2024   Medicare Annual Wellness (AWV)  12/03/2024   Pneumococcal Vaccine: 50+ Years  Completed   Zoster Vaccines- Shingrix  Completed   Hepatitis B Vaccines  Aged Out   HPV VACCINES  Aged Out   Meningococcal B Vaccine  Aged Out    Health Maintenance  Health Maintenance Due  Topic Date Due   DTaP/Tdap/Td (2 - Td or Tdap) 12/26/2022    Colorectal cancer screening: No longer required.   Lung Cancer Screening: (Low Dose CT Chest recommended if Age 45-80 years, 20 pack-year currently smoking OR have quit w/in 15years.) does not qualify.    Additional Screening:  Hepatitis C Screening: does not qualify  Vision Screening: Recommended annual ophthalmology exams for early detection of glaucoma and other disorders of the eye. Is the patient up to date with their annual eye exam?  Yes  Who is the provider or what is the name of the office in which the patient attends annual eye exams? [facility]  If pt is not established with a provider, would they like to be referred to a provider to establish care? No .   Dental Screening: Recommended annual dental exams for proper oral hygiene  Community Resource Referral / Chronic Care Management: CRR required this visit?  No   CCM required this visit?  No     Plan:     I have personally reviewed and noted the following in the patient's chart:   Medical and social history Use of alcohol, tobacco or illicit drugs  Current medications and supplements including opioid prescriptions. Patient is not currently taking opioid prescriptions. Functional ability and status Nutritional status Physical activity Advanced directives List of other physicians Hospitalizations,  surgeries, and ER visits in previous 12 months Vitals Screenings to include cognitive, depression, and falls Referrals and appointments  In addition, I have reviewed and discussed with patient certain preventive protocols, quality metrics, and best practice recommendations. A written personalized care plan for preventive services as well as general preventive health recommendations were provided to patient.     Harlene MARLA An, NP   12/04/2023

## 2023-12-04 NOTE — Patient Instructions (Signed)
  Mr. Frederick Simon , Thank you for taking time to come for your Medicare Wellness Visit. I appreciate your ongoing commitment to your health goals. Please review the following plan we discussed and let me know if I can assist you in the future.   To check on TDAP vaccine.   This is a list of the screening recommended for you and due dates:  Health Maintenance  Topic Date Due   DTaP/Tdap/Td vaccine (2 - Td or Tdap) 12/26/2022   Flu Shot  01/09/2024   COVID-19 Vaccine (11 - 2024-25 season) 03/20/2024   Medicare Annual Wellness Visit  12/03/2024   Pneumococcal Vaccine for age over 10  Completed   Zoster (Shingles) Vaccine  Completed   Hepatitis B Vaccine  Aged Out   HPV Vaccine  Aged Out   Meningitis B Vaccine  Aged Out

## 2024-01-05 ENCOUNTER — Encounter: Payer: Self-pay | Admitting: Student

## 2024-01-05 ENCOUNTER — Non-Acute Institutional Stay: Admitting: Student

## 2024-01-05 DIAGNOSIS — I25119 Atherosclerotic heart disease of native coronary artery with unspecified angina pectoris: Secondary | ICD-10-CM

## 2024-01-05 DIAGNOSIS — G3184 Mild cognitive impairment, so stated: Secondary | ICD-10-CM

## 2024-01-05 DIAGNOSIS — I10 Essential (primary) hypertension: Secondary | ICD-10-CM

## 2024-01-05 DIAGNOSIS — I700 Atherosclerosis of aorta: Secondary | ICD-10-CM

## 2024-01-05 DIAGNOSIS — M545 Low back pain, unspecified: Secondary | ICD-10-CM

## 2024-01-05 DIAGNOSIS — E7800 Pure hypercholesterolemia, unspecified: Secondary | ICD-10-CM

## 2024-01-05 DIAGNOSIS — N1832 Chronic kidney disease, stage 3b: Secondary | ICD-10-CM

## 2024-01-05 DIAGNOSIS — G8929 Other chronic pain: Secondary | ICD-10-CM

## 2024-01-05 DIAGNOSIS — R7303 Prediabetes: Secondary | ICD-10-CM

## 2024-01-05 NOTE — Patient Instructions (Signed)
 VISIT SUMMARY:  You came in today for a routine follow-up visit, accompanied by your caregiver, Maude. We discussed your stable chest pain, chronic back pain, and history of pneumonia. Your overall health appears stable, and your weight has remained consistent despite your fondness for ice cream.  YOUR PLAN:  -INTERMITTENT CHEST PAIN: Intermittent chest pain means you experience chest pain occasionally. It is currently well-managed with aspirin , and you do not need to use nitroglycerin. Continue to monitor your chest pain and take aspirin  as needed.  -CHRONIC BACK PAIN: Chronic back pain means you have had back pain for a long time, in your case, forty years. It is currently well-managed with the use of a walker to help with mobility and prevent falls. Continue using your walker and report any significant changes in your back pain.  -GENERAL HEALTH MAINTENANCE: Your overall health is stable with normal cholesterol, blood count, and an A1c of 6.4, which measures your blood sugar levels over the past three months. Despite your dietary habits, your weight has remained stable. We will check your A1c again in three months.  INSTRUCTIONS:  Please continue to monitor your chest pain and take aspirin  as needed. Keep using your walker to prevent falls and report any significant changes in your back pain. We will check your A1c again in three months.

## 2024-01-05 NOTE — Addendum Note (Signed)
 Addended by: Dariella Gillihan on: 01/05/2024 06:02 PM   Modules accepted: Level of Service

## 2024-01-05 NOTE — Progress Notes (Signed)
 Location:  [facility] POS: [facility] Provider: Kasiya Burck  Code Status: DNR Goals of Care:     12/04/2023   10:00 AM  Advanced Directives  Does Patient Have a Medical Advance Directive? Yes  Type of Advance Directive Out of facility DNR (pink MOST or yellow form)  Does patient want to make changes to medical advance directive? No - Patient declined     Chief Complaint  Patient presents with   Medical Management of Chronic Issues    HPI: Patient is a 89 y.o. male seen today for medical management of chronic diseases.   Discussed the use of AI scribe software for clinical note transcription with the patient, who gave verbal consent to proceed.  History of Present Illness  History of Present Illness The patient presents for a routine follow-up visit. He is accompanied by Maude, a caregiver and nurse. Called daughter, unable to get in touch.   He experiences stable chest pain, which he describes as normal for him. He sometimes takes aspirin  for the chest pain but does not use nitroglycerin. No new chest pain beyond his usual experience.  He has a history of pneumonia, having been hospitalized for it two years ago. He does not recall the provider from that time.  He experiences chronic back pain, which he has had for forty years, but it is not worse than usual. He consistently uses a walker due to fear of falling.  There was a previous issue with disorientation regarding time, but this has improved with the use of a clock that indicates day and night.  He reports good bowel movements and eating habits, humorously noting a fondness for ice cream. Despite this, his weight has remained stable.  Past Medical History - Stable chest pain - History of pneumonia - Chronic back pain - Mild cognitive impairment    Medications - Nitroglycerin (as needed for chest pain) - Aspirin  (as needed for chest pain)  Results LABS   A1c: 6.4% (09/2023)  Assessment and Plan     Past Medical  History:  Diagnosis Date   Acute hypoxemic respiratory failure (HCC) 02/10/2022   AKI (acute kidney injury) (HCC) 02/09/2022   Allergic rhinitis    CAP (community acquired pneumonia) 02/09/2022   Gastroesophageal reflux disease    Hyperlipidemia    Hypertension    Nausea & vomiting 02/11/2022   Severe sepsis (HCC) 02/10/2022    Past Surgical History:  Procedure Laterality Date   BACK SURGERY  May 1975    Dr. Carlos SANES SURGERY  Feb 2004   Dr. Alix    HAND SURGERY  [phone]   Dr. Lucious   NOSE SURGERY  1989   Dr. Jerilynn ROYS SURGERY  [phone]   Dr. Lucious    Allergies  Allergen Reactions   Dust Mite Extract Other (See Comments)    sneeze   Wool Alcohol [Lanolin] Itching    Outpatient Encounter Medications as of 01/05/2024  Medication Sig   acetaminophen  (TYLENOL ) 650 MG CR tablet Take 650 mg by mouth every 8 (eight) hours as needed for pain. Give one tablet by mouth twice daily.   albuterol  (VENTOLIN  HFA) 108 (90 Base) MCG/ACT inhaler Inhale 1 puff into the lungs every 4 (four) hours as needed for wheezing or shortness of breath.   amLODipine (NORVASC) 5 MG tablet Take 5 mg by mouth daily.   bismuth subsalicylate (PEPTO BISMOL) 262 MG/15ML suspension Give 10cc by mouth as needed for loose stool   calcium carbonate (  TUMS - DOSED IN MG ELEMENTAL CALCIUM) 500 MG chewable tablet Chew 1 tablet by mouth every 6 (six) hours as needed for indigestion or heartburn.   carbamide peroxide (DEBROX) 6.5 % OTIC solution Place 5 drops into both ears as needed (for ear wax).   Cholecalciferol  (VITAMIN D ) 50 MCG (2000 UT) CAPS Give one capsule by mouth once daily.   diclofenac Sodium (VOLTAREN) 1 % GEL Apply to lower back topically every 12 hours as needed for pain.   Glucose 15 GM/32ML GEL Give 1 packet by mouth as needed for low blood sugar.   lidocaine  4 % Place 1 patch onto the skin. Apply to lower back topically as needed for pain, remove after 12 hrs.   losartan   (COZAAR ) 100 MG tablet Take 100 mg by mouth daily.   magnesium hydroxide (MILK OF MAGNESIA) 400 MG/5ML suspension Give 2 Tbsp by mouth as needed for constipation. supervised self-administration daily  Call MD if no relief in 3 days of continued use.   Multiple Vitamins-Minerals (PRESERVISION AREDS PO) Take 1 tablet by mouth daily.   nystatin (MYCOSTATIN/NYSTOP) powder Apply 1 Application topically 2 (two) times daily.   ondansetron  (ZOFRAN ) 4 MG tablet Take 4 mg by mouth 3 (three) times daily as needed for nausea or vomiting.   OXYGEN 2lmp for dyspnea or SOB   polyethylene glycol (MIRALAX  / GLYCOLAX ) 17 g packet Take 17 g by mouth daily as needed.   pseudoephedrine-dextromethorphan -guaifenesin  (ROBITUSSIN-PE) 30-10-100 MG/5ML solution Take 10 mLs by mouth every 4 (four) hours as needed for cough.   sennosides-docusate sodium  (SENOKOT-S) 8.6-50 MG tablet Take 1 tablet by mouth at bedtime.   simvastatin  (ZOCOR ) 20 MG tablet Take 20 mg by mouth daily.   triamcinolone cream (KENALOG) 0.1 % Apply 1 Application topically 2 (two) times daily as needed.   vitamin B-12 (CYANOCOBALAMIN ) 1000 MCG tablet Take 1,000 mcg by mouth daily.   No facility-administered encounter medications on file as of 01/05/2024.    Review of Systems:  Review of Systems  Health Maintenance  Topic Date Due   DTaP/Tdap/Td (2 - Td or Tdap) 12/26/2022   INFLUENZA VACCINE  01/09/2024   COVID-19 Vaccine (11 - 2024-25 season) 03/20/2024   Medicare Annual Wellness (AWV)  12/03/2024   Pneumococcal Vaccine: 50+ Years  Completed   Zoster Vaccines- Shingrix  Completed   Hepatitis B Vaccines  Aged Out   HPV VACCINES  Aged Out   Meningococcal B Vaccine  Aged Out    Physical Exam: Vitals:   01/05/24 1747  BP: 125/73  Pulse: 65  Resp: 20  SpO2: 93%  Weight: 189 lb 1.6 oz (85.8 kg)   Body mass index is 27.13 kg/m. Physical Exam Physical Exam   Labs reviewed: Basic Metabolic Panel: Recent Labs    04/10/23 0000  07/14/23 0000 08/11/23 0000  NA 141 139 140  K 4.4 4.5 4.5  CL 109* 108 108  CO2 25* 25* 27*  BUN 35* 28* 29*  CREATININE 2.0* 1.9* 1.8*  CALCIUM 8.8 9.3 9.0  TSH 0.24* 0.44  --    Liver Function Tests: Recent Labs    04/10/23 0000  AST 16  ALT 21  ALKPHOS 61  ALBUMIN  4.0   No results for input(s): LIPASE, AMYLASE in the last 8760 hours. No results for input(s): AMMONIA in the last 8760 hours. CBC: Recent Labs    04/10/23 0000 09/22/23 0000  WBC 5.6 5.2  NEUTROABS 2,184.00 1,799.00  HGB 12.7* 13.4*  HCT 39* 41  PLT 188 202   Lipid Panel: Recent Labs    09/22/23 0000  CHOL 125  HDL 37  LDLCALC 69  TRIG 108   Lab Results  Component Value Date   HGBA1C 6.4 09/22/2023    Procedures since last visit: No results found. Results   Assessment/Plan Intermittent chest painHTN Intermittent chest pain, well-managed with aspirin , no nitroglycerin use.  Mild Cognitive Impairment Patient with history of mild cognitive impairment that shows continued slow decline with disorientation to time. He has been supported with OT to get support with clocks that show time of day which has helped with these episodes. He remains independent in ADLs and receives support for medications.   Chronic back pain Chronic back pain for 40 years, well-managed with consistent use of walker for mobility and fall prevention. - Continue using walker to prevent falls. - Report any significant changes in back pain. - lidocaine  patches PRN  Prediabetes HLD Stage 3 CKD Overall health stable with normal cholesterol, blood count, and A1c of 6.4. No weight gain despite dietary habits. - Check A1c in three months.  Hypertension well-controlled with losartan , amlodipine. Continue cholesterol medication.    Labs/tests ordered:  * No order type specified * Next appt:  Visit date not found

## 2024-03-25 LAB — COMPREHENSIVE METABOLIC PANEL WITH GFR
Albumin: 3.9 (ref 3.5–5.0)
Calcium: 8.8 (ref 8.7–10.7)
Globulin: 2.2
eGFR: 39

## 2024-03-25 LAB — HEPATIC FUNCTION PANEL
ALT: 13 U/L (ref 10–40)
AST: 14 (ref 14–40)
Alkaline Phosphatase: 71 (ref 25–125)
Bilirubin, Total: 0.5

## 2024-03-25 LAB — BASIC METABOLIC PANEL WITH GFR
BUN: 27 — AB (ref 4–21)
CO2: 25 — AB (ref 13–22)
Chloride: 109 — AB (ref 99–108)
Creatinine: 1.6 — AB (ref 0.6–1.3)
Glucose: 90
Potassium: 4.3 meq/L (ref 3.5–5.1)
Sodium: 142 (ref 137–147)

## 2024-03-25 LAB — CBC: RBC: 4.1 (ref 3.87–5.11)

## 2024-03-25 LAB — CBC AND DIFFERENTIAL
HCT: 39 — AB (ref 41–53)
Hemoglobin: 12.7 — AB (ref 13.5–17.5)
Neutrophils Absolute: 2670
Platelets: 180 K/uL (ref 150–400)
WBC: 6

## 2024-03-25 LAB — TSH: TSH: 0.25 — AB (ref 0.41–5.90)

## 2024-03-30 ENCOUNTER — Encounter: Payer: Self-pay | Admitting: Nurse Practitioner

## 2024-03-30 ENCOUNTER — Non-Acute Institutional Stay: Admitting: Nurse Practitioner

## 2024-03-30 DIAGNOSIS — M150 Primary generalized (osteo)arthritis: Secondary | ICD-10-CM

## 2024-03-30 DIAGNOSIS — E7800 Pure hypercholesterolemia, unspecified: Secondary | ICD-10-CM

## 2024-03-30 DIAGNOSIS — J301 Allergic rhinitis due to pollen: Secondary | ICD-10-CM

## 2024-03-30 DIAGNOSIS — K219 Gastro-esophageal reflux disease without esophagitis: Secondary | ICD-10-CM

## 2024-03-30 DIAGNOSIS — G3184 Mild cognitive impairment, so stated: Secondary | ICD-10-CM

## 2024-03-30 DIAGNOSIS — N1832 Chronic kidney disease, stage 3b: Secondary | ICD-10-CM | POA: Diagnosis not present

## 2024-03-30 DIAGNOSIS — H903 Sensorineural hearing loss, bilateral: Secondary | ICD-10-CM

## 2024-03-30 DIAGNOSIS — G8929 Other chronic pain: Secondary | ICD-10-CM

## 2024-03-30 DIAGNOSIS — I10 Essential (primary) hypertension: Secondary | ICD-10-CM | POA: Diagnosis not present

## 2024-03-30 DIAGNOSIS — R7989 Other specified abnormal findings of blood chemistry: Secondary | ICD-10-CM

## 2024-03-30 DIAGNOSIS — M545 Low back pain, unspecified: Secondary | ICD-10-CM

## 2024-03-30 NOTE — Progress Notes (Deleted)
 Location:      Place of Service:     Frederick Fine, MD  Patient Care Team: Frederick Fine, MD as PCP - General (Family Medicine) Frederick Harlene POUR, NP as Nurse Practitioner (Geriatric Medicine) Darron Deatrice LABOR, MD as Consulting Physician (Cardiology)  Extended Emergency Contact Information Primary Emergency Contact: Frederick,Simon  [place] Home Phone: [phone] Mobile Phone: [phone] Relation: Daughter Secondary Emergency Contact: Frederick Simon  [place] Mobile Phone: [phone] Relation: Daughter  Goals of care: Advanced Directive information    12/04/2023   10:00 AM  Advanced Directives  Does Patient Have a Medical Advance Directive? Yes  Type of Advance Directive Out of facility DNR (pink MOST or yellow form)  Does patient want to make changes to medical advance directive? No - Patient declined     No chief complaint on file.   HPI:  Pt is a 89 y.o. male seen today for medical management of chronic disease. ***   Past Medical History:  Diagnosis Date   Acute hypoxemic respiratory failure (HCC) 02/10/2022   AKI (acute kidney injury) 02/09/2022   Allergic rhinitis    CAP (community acquired pneumonia) 02/09/2022   Gastroesophageal reflux disease    Hyperlipidemia    Hypertension    Nausea & vomiting 02/11/2022   Severe sepsis (HCC) 02/10/2022   Past Surgical History:  Procedure Laterality Date   BACK SURGERY  May 1975    Dr. Carlos SANES SURGERY  Feb 2004   Dr. Alix    HAND SURGERY  [phone]   Dr. Lucious   NOSE SURGERY  1989   Dr. Jerilynn ROYS SURGERY  [phone]   Dr. Lucious    Allergies  Allergen Reactions   Dust Mite Extract Other (See Comments)    sneeze   Wool Alcohol [Lanolin] Itching    Outpatient Encounter Medications as of 03/30/2024  Medication Sig   acetaminophen  (TYLENOL ) 650 MG CR tablet Take 650 mg by mouth every 8 (eight) hours as needed for pain. Give one  tablet by mouth twice daily.   albuterol  (VENTOLIN  HFA) 108 (90 Base) MCG/ACT inhaler Inhale 1 puff into the lungs every 4 (four) hours as needed for wheezing or shortness of breath.   amLODipine (NORVASC) 5 MG tablet Take 5 mg by mouth daily.   bismuth subsalicylate (PEPTO BISMOL) 262 MG/15ML suspension Give 10cc by mouth as needed for loose stool   calcium carbonate (TUMS - DOSED IN MG ELEMENTAL CALCIUM) 500 MG chewable tablet Chew 1 tablet by mouth every 6 (six) hours as needed for indigestion or heartburn.   carbamide peroxide (DEBROX) 6.5 % OTIC solution Place 5 drops into both ears as needed (for ear wax).   Cholecalciferol  (VITAMIN D ) 50 MCG (2000 UT) CAPS Give one capsule by mouth once daily.   diclofenac Sodium (VOLTAREN) 1 % GEL Apply to lower back topically every 12 hours as needed for pain.   Glucose 15 GM/32ML GEL Give 1 packet by mouth as needed for low blood sugar.   lidocaine  4 % Place 1 patch onto the skin. Apply to lower back topically as needed for pain, remove after 12 hrs.   losartan  (COZAAR ) 100 MG tablet Take 100 mg by mouth daily.   magnesium hydroxide (MILK OF MAGNESIA) 400 MG/5ML suspension Give 2 Tbsp by mouth as needed for constipation. supervised self-administration daily  Call MD if no relief in 3 days of continued use.   Multiple Vitamins-Minerals (PRESERVISION AREDS PO) Take 1 tablet  by mouth daily.   nystatin (MYCOSTATIN/NYSTOP) powder Apply 1 Application topically 2 (two) times daily.   ondansetron  (ZOFRAN ) 4 MG tablet Take 4 mg by mouth 3 (three) times daily as needed for nausea or vomiting.   OXYGEN 2lmp for dyspnea or SOB   polyethylene glycol (MIRALAX  / GLYCOLAX ) 17 g packet Take 17 g by mouth daily as needed.   pseudoephedrine-dextromethorphan -guaifenesin  (ROBITUSSIN-PE) 30-10-100 MG/5ML solution Take 10 mLs by mouth every 4 (four) hours as needed for cough.   sennosides-docusate sodium  (SENOKOT-S) 8.6-50 MG tablet Take 1 tablet by mouth at bedtime.    simvastatin  (ZOCOR ) 20 MG tablet Take 20 mg by mouth daily.   triamcinolone cream (KENALOG) 0.1 % Apply 1 Application topically 2 (two) times daily as needed.   vitamin B-12 (CYANOCOBALAMIN ) 1000 MCG tablet Take 1,000 mcg by mouth daily.   No facility-administered encounter medications on file as of 03/30/2024.    Review of Systems ***  Immunization History  Administered Date(s) Administered   Fluad Trivalent(High Dose 65+) 03/28/2023   Influenza-Unspecified 03/27/2021, 03/27/2022   Moderna Covid-19 Fall Seasonal Vaccine 74yrs & older 09/17/2022   Moderna Sars-Covid-2 Vaccination 07/13/2019, 08/12/2019, 11/06/2021, 04/19/2022, 03/07/2023   Pneumococcal Conjugate-13 06/10/2013   Pneumococcal Polysaccharide-23 06/11/2007, 11/17/2017   Tdap 12/25/2012   Unspecified SARS-COV-2 Vaccination 04/02/2020, 10/27/2020, 03/02/2021, 09/19/2023   Zoster Recombinant(Shingrix) 09/20/2009, 11/12/2022   Pertinent  Health Maintenance Due  Topic Date Due   Influenza Vaccine  01/09/2024      02/13/2022    9:30 PM 02/14/2022   10:00 AM 02/14/2022    7:30 PM 02/15/2022    7:38 AM 12/04/2023   11:58 AM  Fall Risk  Falls in the past year?     0  Was there an injury with Fall?     0  Fall Risk Category Calculator     0  (RETIRED) Patient Fall Risk Level High fall risk  High fall risk  High fall risk  High fall risk    Patient at Risk for Falls Due to     History of fall(s)  Fall risk Follow up     Falls evaluation completed     Data saved with a previous flowsheet row definition   Functional Status Survey:    There were no vitals filed for this visit. There is no height or weight on file to calculate BMI. Physical Exam***  Labs reviewed: Recent Labs    04/10/23 0000 07/14/23 0000 08/11/23 0000  NA 141 139 140  K 4.4 4.5 4.5  CL 109* 108 108  CO2 25* 25* 27*  BUN 35* 28* 29*  CREATININE 2.0* 1.9* 1.8*  CALCIUM 8.8 9.3 9.0   Recent Labs    04/10/23 0000  AST 16  ALT 21  ALKPHOS 61   ALBUMIN  4.0   Recent Labs    04/10/23 0000 09/22/23 0000  WBC 5.6 5.2  NEUTROABS 2,184.00 1,799.00  HGB 12.7* 13.4*  HCT 39* 41  PLT 188 202   Lab Results  Component Value Date   TSH 0.44 07/14/2023   Lab Results  Component Value Date   HGBA1C 6.4 09/22/2023   Lab Results  Component Value Date   CHOL 125 09/22/2023   HDL 37 09/22/2023   LDLCALC 69 09/22/2023   TRIG 108 09/22/2023   CHOLHDL 2.7 02/09/2022    Significant Diagnostic Results in last 30 days:  No results found.  Assessment/Plan No problem-specific Assessment & Plan notes found for this encounter.  Cecil Vandyke K. Frederick BODILY [facility] [phone]

## 2024-03-30 NOTE — Progress Notes (Signed)
 Location:  Other Nursing Home Room Number: [facility] [place] [id number] Place of Service:  ALF (13)  Laurence Locus, DO  Patient Care Team: Laurence Locus, DO as PCP - General (Internal Medicine) [name], NP as Nurse Practitioner (Geriatric Medicine) Darron Deatrice LABOR, MD as Consulting Physician (Cardiology)  Extended Emergency Contact Information Primary Emergency Contact: [name],[name]  [place] Home Phone: [phone] Mobile Phone: [phone] Relation: Daughter Secondary Emergency Contact: Jackquline Ip  [place] Mobile Phone: [phone] Relation: Daughter  Goals of care: Advanced Directive information    03/30/2024    4:01 PM  Advanced Directives  Does Patient Have a Medical Advance Directive? Yes  Type of Advance Directive Out of facility DNR (pink MOST or yellow form)  Does patient want to make changes to medical advance directive? No - Patient declined   Chief Complaint  Patient presents with   Medical Management of Chronic Issues    Medical Management of Chronic Issues.    HPI:  The patient is a 89 year old male seen today for routine medical management of chronic conditions. He reports feeling well overall and maintains a good appetite. He experiences occasional shooting headaches localized to the forehead, which he describes as familiar and short-lived. These episodes are sometimes relieved by rest or a nap. Bowel movements and urination are normal.  He has a history of back pain and reports undergoing two prior back surgeries. Reports pain is well controlled.   He has four daughters, one of whom visits regularly. He previously worked as an Acupuncturist on classified projects and enjoys reading and watching television.  The nurses assist him with medication administration, and he reports good adherence. He sleeps well at night.  He occasionally feels short of breath or as though he's not getting enough oxygen, but finds  relief with deep breathing exercises. He remains in good spirits and jokes that he doesn't need anything.   Staff notes episodes where he has been found wondering at night. He is easily redirected and goes back to his apartment. No recent episodes.  Past Medical History:  Diagnosis Date   Acute hypoxemic respiratory failure (HCC) 02/10/2022   AKI (acute kidney injury) 02/09/2022   Allergic rhinitis    CAP (community acquired pneumonia) 02/09/2022   Gastroesophageal reflux disease    Hyperlipidemia    Hypertension    Nausea & vomiting 02/11/2022   Severe sepsis (HCC) 02/10/2022   Past Surgical History:  Procedure Laterality Date   BACK SURGERY  May 1975    Dr. Carlos SANES SURGERY  Feb 2004   Dr. Alix    HAND SURGERY  [phone]   Dr. Lucious   NOSE SURGERY  1989   Dr. Jerilynn ROYS SURGERY  [phone]   Dr. Lucious   Allergies  Allergen Reactions   Dust Mite Extract Other (See Comments)    sneeze   Wool Alcohol [Lanolin] Itching   Outpatient Encounter Medications as of 03/30/2024  Medication Sig   acetaminophen  (TYLENOL ) 650 MG CR tablet Take 650 mg by mouth every 8 (eight) hours as needed for pain. Give one tablet by mouth twice daily.   albuterol  (VENTOLIN  HFA) 108 (90 Base) MCG/ACT inhaler Inhale 1 puff into the lungs every 4 (four) hours as needed for wheezing or shortness of breath.   alum & mag hydroxide-simeth (MAALOX PLUS) 400-400-40 MG/5ML suspension Take 10 mLs by mouth every 4 (four) hours as needed for indigestion.   amLODipine (NORVASC) 5 MG tablet Take  5 mg by mouth daily.   bismuth subsalicylate (PEPTO BISMOL) 262 MG/15ML suspension Give 10cc by mouth as needed for loose stool   calcium carbonate (TUMS - DOSED IN MG ELEMENTAL CALCIUM) 500 MG chewable tablet Chew 1 tablet by mouth every 6 (six) hours as needed for indigestion or heartburn.   carbamide peroxide (DEBROX) 6.5 % OTIC solution Place 5 drops into both ears as needed (for ear wax).    Cholecalciferol  (VITAMIN D ) 50 MCG (2000 UT) CAPS Give one capsule by mouth once daily.   diclofenac Sodium (VOLTAREN) 1 % GEL Apply to lower back topically every 12 hours as needed for pain.   Glucose 15 GM/32ML GEL Give 1 packet by mouth as needed for low blood sugar.   lidocaine  4 % Place 1 patch onto the skin. Apply to lower back topically as needed for pain, remove after 12 hrs.   losartan  (COZAAR ) 100 MG tablet Take 100 mg by mouth daily.   magnesium hydroxide (MILK OF MAGNESIA) 400 MG/5ML suspension Give 2 Tbsp by mouth as needed for constipation. supervised self-administration daily  Call MD if no relief in 3 days of continued use.   Multiple Vitamins-Minerals (PRESERVISION AREDS PO) Take 1 tablet by mouth daily.   nystatin (MYCOSTATIN/NYSTOP) powder Apply 1 Application topically 2 (two) times daily.   ondansetron  (ZOFRAN ) 4 MG tablet Take 4 mg by mouth 3 (three) times daily as needed for nausea or vomiting.   OXYGEN 2lmp for dyspnea or SOB   polyethylene glycol (MIRALAX  / GLYCOLAX ) 17 g packet Take 17 g by mouth daily as needed.   pseudoephedrine-dextromethorphan -guaifenesin  (ROBITUSSIN-PE) 30-10-100 MG/5ML solution Take 10 mLs by mouth every 4 (four) hours as needed for cough.   sennosides-docusate sodium  (SENOKOT-S) 8.6-50 MG tablet Take 1 tablet by mouth at bedtime.   simvastatin  (ZOCOR ) 20 MG tablet Take 20 mg by mouth daily.   triamcinolone cream (KENALOG) 0.1 % Apply 1 Application topically 2 (two) times daily as needed.   vitamin B-12 (CYANOCOBALAMIN ) 1000 MCG tablet Take 1,000 mcg by mouth daily.   No facility-administered encounter medications on file as of 03/30/2024.   Review of Systems  Constitutional: Negative.  Negative for activity change, appetite change, fatigue and unexpected weight change.  HENT:  Positive for congestion and hearing loss.   Eyes: Negative.   Respiratory:  Negative for cough and shortness of breath.   Cardiovascular: Negative.  Negative for chest  pain, palpitations and leg swelling.  Gastrointestinal: Negative.  Negative for abdominal pain, constipation and diarrhea.  Genitourinary: Negative.  Negative for difficulty urinating and dysuria.  Musculoskeletal:  Positive for back pain. Negative for arthralgias and myalgias.  Skin: Negative.  Negative for color change and wound.  Neurological: Negative.  Negative for dizziness and weakness.  Psychiatric/Behavioral:  Positive for confusion (at times). Negative for agitation and behavioral problems.     Immunization History  Administered Date(s) Administered   Fluad Trivalent(High Dose 65+) 03/28/2023   Influenza-Unspecified 03/27/2021, 03/27/2022   Moderna Covid-19 Fall Seasonal Vaccine 63yrs & older 09/17/2022   Moderna Sars-Covid-2 Vaccination 07/13/2019, 08/12/2019, 11/06/2021, 04/19/2022, 03/07/2023   Pneumococcal Conjugate-13 06/10/2013   Pneumococcal Polysaccharide-23 06/11/2007, 11/17/2017   Tdap 12/25/2012, 01/07/2024   Unspecified SARS-COV-2 Vaccination 04/02/2020, 10/27/2020, 03/02/2021, 09/19/2023   Zoster Recombinant(Shingrix) 09/20/2009, 11/12/2022   Pertinent  Health Maintenance Due  Topic Date Due   Influenza Vaccine  01/09/2024      02/13/2022    9:30 PM 02/14/2022   10:00 AM 02/14/2022    7:30 PM  02/15/2022    7:38 AM 12/04/2023   11:58 AM  Fall Risk  Falls in the past year?     0  Was there an injury with Fall?     0  Fall Risk Category Calculator     0  (RETIRED) Patient Fall Risk Level High fall risk  High fall risk  High fall risk  High fall risk    Patient at Risk for Falls Due to     History of fall(s)  Fall risk Follow up     Falls evaluation completed     Data saved with a previous flowsheet row definition   Functional Status Survey:   Vitals:   03/30/24 1551  BP: 135/63  Pulse: 64  Resp: 17  Temp: 97.6 F (36.4 C)  SpO2: 95%  Weight: 192 lb (87.1 kg)  Height: 5' 10 (1.778 m)   Body mass index is 27.55 kg/m.  Physical Exam Vitals reviewed.   Constitutional:      Appearance: Normal appearance.  HENT:     Head: Normocephalic and atraumatic.     Right Ear: External ear normal. Decreased hearing noted.     Left Ear: External ear normal. Decreased hearing noted.     Nose: Nose normal.     Mouth/Throat:     Mouth: Mucous membranes are moist.     Pharynx: Oropharynx is clear.  Eyes:     Conjunctiva/sclera: Conjunctivae normal.  Cardiovascular:     Rate and Rhythm: Normal rate and regular rhythm.     Pulses: Normal pulses.     Heart sounds: Normal heart sounds.  Pulmonary:     Effort: Pulmonary effort is normal.     Breath sounds: Normal breath sounds.  Abdominal:     General: Bowel sounds are normal.     Palpations: Abdomen is soft.  Musculoskeletal:     Right lower leg: Edema present.     Left lower leg: Edema present.  Skin:    General: Skin is warm and dry.  Neurological:     General: No focal deficit present.     Mental Status: He is alert. Mental status is at baseline.  Psychiatric:        Attention and Perception: Attention normal.        Mood and Affect: Mood normal.        Speech: Speech normal.        Behavior: Behavior normal.        Cognition and Memory: Memory is impaired.    Labs reviewed: Recent Labs    07/14/23 0000 08/11/23 0000 03/25/24 0000  NA 139 140 142  K 4.5 4.5 4.3  CL 108 108 109*  CO2 25* 27* 25*  BUN 28* 29* 27*  CREATININE 1.9* 1.8* 1.6*  CALCIUM 9.3 9.0 8.8   Recent Labs    04/10/23 0000 03/25/24 0000  AST 16 14  ALT 21 13  ALKPHOS 61 71  ALBUMIN  4.0 3.9   Recent Labs    04/10/23 0000 09/22/23 0000 03/25/24 0000  WBC 5.6 5.2 6.0  NEUTROABS 2,184.00 1,799.00 2,670.00  HGB 12.7* 13.4* 12.7*  HCT 39* 41 39*  PLT 188 202 180   Lab Results  Component Value Date   TSH 0.25 (A) 03/25/2024   Lab Results  Component Value Date   HGBA1C 6.4 09/22/2023   Lab Results  Component Value Date   CHOL 125 09/22/2023   HDL 37 09/22/2023   LDLCALC 69 09/22/2023  TRIG 108 09/22/2023   CHOLHDL 2.7 02/09/2022   Significant Diagnostic Results in last 30 days:  No results found.  Assessment/Plan 1. Stage 3b chronic kidney disease (HCC) (Primary) -Chronic and stable Encourage proper hydration Follow metabolic panel Avoid nephrotoxic meds (NSAIDS)  2. Primary hypertension Blood pressure well controlled, goal bp <140/90 Continue current medications and dietary modifications follow metabolic panel  3. Seasonal allergic rhinitis due to pollen - No acute complaints  occasional mild nasal congestion noted.  - Continue monitoring for symptom progression  4. Gastroesophageal reflux disease without esophagitis - No acute complaints - Continue Tums and Pepto-Bismol as prescribed PRN  5. Pure hypercholesterolemia - Last lipid panel (April 2025): within normal limits - Continue simvastatin  as prescribed - Recheck annually  6. Mild cognitive impairment - Stable with slight worsening  - Staff reports nocturnal wandering and confusion between day and night - Continue monitoring cognitive status and safety  7. Chronic low back pain without sciatica, unspecified back pain laterality - Chronic and stable - Continue acetaminophen  (Tylenol ) for pain  8. Primary osteoarthritis involving multiple joints - Chronic and stable - Continue acetaminophen  (Tylenol ) as needed for pain  9. Bilateral sensorineural hearing loss - Chronic condition - Continue wearing hearing aids during waking hours - Encourage continued social engagement  10. Low TSH level - Last TSH low on last labs, not on medication; no associated physical symptoms - No treatment indicated at this time - Recheck TSH in 3 months and continue monitoring   Irfat Ione, AGPCNP Student - [facility] -I personally was present during the history, physical exam and medical decision-making activities of this service and have verified that the service and findings are accurately documented in  the student's note Jarelis Ehlert K. Caro BODILY [facility] [phone]

## 2024-04-07 ENCOUNTER — Encounter: Payer: Self-pay | Admitting: Internal Medicine

## 2024-04-07 ENCOUNTER — Non-Acute Institutional Stay: Payer: Self-pay | Admitting: Internal Medicine

## 2024-04-07 DIAGNOSIS — S86812A Strain of other muscle(s) and tendon(s) at lower leg level, left leg, initial encounter: Secondary | ICD-10-CM

## 2024-04-07 NOTE — Progress Notes (Signed)
 [facility] Acute Care Progress Note    Location:  [facility] Room Number: [facility] [place] [id number] Place of Service:  ALF (13)   Laurence Locus, DO   Patient Care Team: Laurence Locus, DO as PCP - General (Internal Medicine) [name], NP as Nurse Practitioner (Geriatric Medicine) Darron Deatrice LABOR, MD as Consulting Physician (Cardiology)   Extended Emergency Contact Information Primary Emergency Contact: [name],[name]  [place] Home Phone: [phone] Mobile Phone: [phone] Relation: Daughter Secondary Emergency Contact: Jackquline Ip  [place] Mobile Phone: [phone] Relation: Daughter   Goals of care: Advanced Directive information    03/30/2024    4:01 PM  Advanced Directives  Does Patient Have a Medical Advance Directive? Yes  Type of Advance Directive Out of facility DNR (pink MOST or yellow form)  Does patient want to make changes to medical advance directive? No - Patient declined     CODE STATUS:  Do Not Resuscitate (DNR)   Chief Complaint  Patient presents with   Leg Pain    Left Leg Pain.      HPI: Pt is a 89 y.o. male seen today for an acute visit for Left Leg Pain.  RN reports the patient has been having pain since about Monday.  This is 2 days ago.  Noted to have a limp of his left leg.  Patient walks frequently and every day with his daughters.  He has 4 daughters.  Does not recall any injuries.  Hurts to stand on it.  He is still able to walk on it but it is painful.  He is already on 650 of Tylenol  twice daily.  He has never had a DVT of his leg.  He is on minimal scheduled medications but only include Norvasc 5 mg daily, losartan  100 mg daily Zocor  20 mg daily foods of vitamins.  He has never had cancer.     Past Medical History:  Diagnosis Date   Acute hypoxemic respiratory failure (HCC) 02/10/2022   AKI (acute kidney injury) 02/09/2022   Allergic rhinitis    CAP (community acquired  pneumonia) 02/09/2022   Gastroesophageal reflux disease    Hyperlipidemia    Hypertension    Nausea & vomiting 02/11/2022   Severe sepsis (HCC) 02/10/2022   Past Surgical History:  Procedure Laterality Date   BACK SURGERY  May 1975    Dr. Carlos SANES SURGERY  Feb 2004   Dr. Alix    HAND SURGERY  [phone]   Dr. Lucious   NOSE SURGERY  1989   Dr. Jerilynn ROYS SURGERY  [phone]   Dr. Lucious     Allergies  Allergen Reactions   Dust Mite Extract Other (See Comments)    sneeze   Wool Alcohol [Lanolin] Itching     Outpatient Encounter Medications as of 04/07/2024  Medication Sig   acetaminophen  (TYLENOL ) 650 MG CR tablet Take 650 mg by mouth every 8 (eight) hours as needed for pain. Give one tablet by mouth twice daily.   albuterol  (VENTOLIN  HFA) 108 (90 Base) MCG/ACT inhaler Inhale 1 puff into the lungs every 4 (four) hours as needed for wheezing or shortness of breath.   alum & mag hydroxide-simeth (MAALOX PLUS) 400-400-40 MG/5ML suspension Take 10 mLs by mouth every 4 (four) hours as needed for indigestion.   amLODipine (NORVASC) 5 MG tablet Take 5 mg by mouth daily.   bismuth subsalicylate (PEPTO BISMOL) 262 MG/15ML suspension Give 10cc by  mouth as needed for loose stool   calcium carbonate (TUMS - DOSED IN MG ELEMENTAL CALCIUM) 500 MG chewable tablet Chew 1 tablet by mouth every 6 (six) hours as needed for indigestion or heartburn.   carbamide peroxide (DEBROX) 6.5 % OTIC solution Place 5 drops into both ears as needed (for ear wax).   Cholecalciferol  (VITAMIN D ) 50 MCG (2000 UT) CAPS Give one capsule by mouth once daily.   diclofenac Sodium (VOLTAREN) 1 % GEL Apply to lower back topically every 12 hours as needed for pain.   Glucose 15 GM/32ML GEL Give 1 packet by mouth as needed for low blood sugar.   lidocaine  4 % Place 1 patch onto the skin. Apply to lower back topically as needed for pain, remove after 12 hrs.   losartan  (COZAAR ) 100 MG tablet Take 100  mg by mouth daily.   magnesium hydroxide (MILK OF MAGNESIA) 400 MG/5ML suspension Give 2 Tbsp by mouth as needed for constipation. supervised self-administration daily  Call MD if no relief in 3 days of continued use.   Multiple Vitamins-Minerals (PRESERVISION AREDS PO) Take 1 tablet by mouth daily.   nystatin (MYCOSTATIN/NYSTOP) powder Apply 1 Application topically 2 (two) times daily.   ondansetron  (ZOFRAN ) 4 MG tablet Take 4 mg by mouth 3 (three) times daily as needed for nausea or vomiting.   OXYGEN 2lmp for dyspnea or SOB   polyethylene glycol (MIRALAX  / GLYCOLAX ) 17 g packet Take 17 g by mouth daily as needed.   pseudoephedrine-dextromethorphan -guaifenesin  (ROBITUSSIN-PE) 30-10-100 MG/5ML solution Take 10 mLs by mouth every 4 (four) hours as needed for cough.   sennosides-docusate sodium  (SENOKOT-S) 8.6-50 MG tablet Take 1 tablet by mouth at bedtime.   simvastatin  (ZOCOR ) 20 MG tablet Take 20 mg by mouth daily.   triamcinolone cream (KENALOG) 0.1 % Apply 1 Application topically 2 (two) times daily as needed.   vitamin B-12 (CYANOCOBALAMIN ) 1000 MCG tablet Take 1,000 mcg by mouth daily.   No facility-administered encounter medications on file as of 04/07/2024.     Review of Systems  Constitutional: Negative.   HENT: Negative.    Eyes: Negative.   Respiratory: Negative.    Cardiovascular: Negative.   Gastrointestinal: Negative.   Endocrine: Negative.   Genitourinary: Negative.   Musculoskeletal:        Onset of left calf pain and Achilles tendon pain about 3 days ago.  No injuries.  Skin: Negative.   Allergic/Immunologic: Negative.   Neurological: Negative.   Hematological: Negative.   Psychiatric/Behavioral: Negative.    All other systems reviewed and are negative.    Immunization History  Administered Date(s) Administered   Fluad Trivalent(High Dose 65+) 03/28/2023   Influenza-Unspecified 03/27/2021, 03/27/2022   Moderna Covid-19 Fall Seasonal Vaccine 48yrs & older  09/17/2022   Moderna Sars-Covid-2 Vaccination 07/13/2019, 08/12/2019, 11/06/2021, 04/19/2022, 03/07/2023   Pneumococcal Conjugate-13 06/10/2013   Pneumococcal Polysaccharide-23 06/11/2007, 11/17/2017   Tdap 12/25/2012, 01/07/2024   Unspecified SARS-COV-2 Vaccination 04/02/2020, 10/27/2020, 03/02/2021, 09/19/2023   Zoster Recombinant(Shingrix) 09/20/2009, 11/12/2022   Pertinent  Health Maintenance Due  Topic Date Due   Influenza Vaccine  01/09/2024      02/13/2022    9:30 PM 02/14/2022   10:00 AM 02/14/2022    7:30 PM 02/15/2022    7:38 AM 12/04/2023   11:58 AM  Fall Risk  Falls in the past year?     0  Was there an injury with Fall?     0  Fall Risk Category Calculator  0  (RETIRED) Patient Fall Risk Level High fall risk  High fall risk  High fall risk  High fall risk    Patient at Risk for Falls Due to     History of fall(s)  Fall risk Follow up     Falls evaluation completed     Data saved with a previous flowsheet row definition   Functional Status Survey:     Vitals:   04/07/24 1326  BP: 135/79  Pulse: 64  Resp: 17  Temp: 97.6 F (36.4 C)  SpO2: 95%  Weight: 192 lb (87.1 kg)  Height: 5' 10 (1.778 m)   Body mass index is 27.55 kg/m. Physical Exam Vitals and nursing note reviewed.  Constitutional:      General: He is not in acute distress.    Appearance: He is not toxic-appearing or diaphoretic.     Comments: Looks younger than stated age of 56.  HENT:     Head: Normocephalic and atraumatic.  Eyes:     General: No scleral icterus. Cardiovascular:     Pulses:          Dorsalis pedis pulses are 1+ on the left side.  Abdominal:     General: There is no distension.  Musculoskeletal:     Comments: +1 dorsalis pedis pulse of the left foot.  No edema.  Mild tenderness at the junction of the Achilles tendon to the gastrocnemius.  No edema.  No palpable cord.  No erythema or phlegmasia.  Bedside portable ultrasound was negative for popliteal or peroneal DVT of the  left lower leg.  Skin:    General: Skin is warm and dry.     Findings: No bruising.  Neurological:     Mental Status: He is alert and oriented to person, place, and time.     Comments: Hard of hearing.      Labs reviewed: Recent Labs    07/14/23 0000 08/11/23 0000 03/25/24 0000  NA 139 140 142  K 4.5 4.5 4.3  CL 108 108 109*  CO2 25* 27* 25*  BUN 28* 29* 27*  CREATININE 1.9* 1.8* 1.6*  CALCIUM 9.3 9.0 8.8   Recent Labs    04/10/23 0000 03/25/24 0000  AST 16 14  ALT 21 13  ALKPHOS 61 71  ALBUMIN  4.0 3.9   Recent Labs    04/10/23 0000 09/22/23 0000 03/25/24 0000  WBC 5.6 5.2 6.0  NEUTROABS 2,184.00 1,799.00 2,670.00  HGB 12.7* 13.4* 12.7*  HCT 39* 41 39*  PLT 188 202 180   Lab Results  Component Value Date   TSH 0.25 (A) 03/25/2024   Lab Results  Component Value Date   HGBA1C 6.4 09/22/2023   Lab Results  Component Value Date   CHOL 125 09/22/2023   HDL 37 09/22/2023   LDLCALC 69 09/22/2023   TRIG 108 09/22/2023   CHOLHDL 2.7 02/09/2022    Assessment & Plan Strain of left calf muscle Increase Tylenol  to 1000 mg 3 times daily x 5 days then decrease back to 650 mg twice daily.  Short-term Zanaflex  2 mg twice daily as needed muscle spasm x 5 days then DC.      Camellia Door, DO  [facility] [phone]

## 2024-04-07 NOTE — Assessment & Plan Note (Addendum)
 Increase Tylenol  to 1000 mg 3 times daily x 5 days then decrease back to 650 mg twice daily.  Short-term Zanaflex  2 mg twice daily as needed muscle spasm x 5 days then DC.

## 2024-05-20 ENCOUNTER — Emergency Department

## 2024-05-20 ENCOUNTER — Other Ambulatory Visit: Payer: Self-pay

## 2024-05-20 ENCOUNTER — Emergency Department
Admission: EM | Admit: 2024-05-20 | Discharge: 2024-05-21 | Disposition: A | Discharge status: Home / Self Care | Attending: Emergency Medicine | Admitting: Emergency Medicine

## 2024-05-20 DIAGNOSIS — W19XXXA Unspecified fall, initial encounter: Secondary | ICD-10-CM | POA: Insufficient documentation

## 2024-05-20 DIAGNOSIS — J40 Bronchitis, not specified as acute or chronic: Secondary | ICD-10-CM | POA: Insufficient documentation

## 2024-05-20 DIAGNOSIS — I129 Hypertensive chronic kidney disease with stage 1 through stage 4 chronic kidney disease, or unspecified chronic kidney disease: Secondary | ICD-10-CM | POA: Insufficient documentation

## 2024-05-20 DIAGNOSIS — N1832 Chronic kidney disease, stage 3b: Secondary | ICD-10-CM | POA: Insufficient documentation

## 2024-05-20 DIAGNOSIS — J111 Influenza due to unidentified influenza virus with other respiratory manifestations: Secondary | ICD-10-CM | POA: Insufficient documentation

## 2024-05-20 DIAGNOSIS — M25552 Pain in left hip: Secondary | ICD-10-CM | POA: Insufficient documentation

## 2024-05-20 MED ORDER — LIDOCAINE 5 % EX PTCH
1.0000 | MEDICATED_PATCH | CUTANEOUS | Status: DC
  Administered 2024-05-20: 1 via TRANSDERMAL
  Filled 2024-05-20: qty 1

## 2024-05-20 MED ORDER — LIDOCAINE 5 % EX PTCH: 1.0000 | MEDICATED_PATCH | Freq: Two times a day (BID) | CUTANEOUS | 0 refills | Status: AC

## 2024-05-20 NOTE — ED Provider Notes (Signed)
 [facility] Provider Note    Event Date/Time   First MD Initiated Contact with Patient 05/20/24 2149     (approximate)   History   Fall    HPI  Frederick Simon is a 89 y.o. male    with a past medical history of stage IIIb chronic kidney disease, primary hypertension, elevated BNP,who presents to the ED complaining of fall. According to the patient's daughter, patient was found on the floor at the assisted living room.  No witnesses of the fall.  Patient cannot remember if he hit his head.  During triage patient was complaining about left hip pain.  The patient was positive for flu.  Patient is here with his daughter.  Patient is not taking blood thinners. I did review medications given today at the facility, last acetaminophen  was at 7 AM.     Patient Active Problem List   Diagnosis Date Noted   Strain of left calf muscle 04/07/2024   Bilateral sensorineural hearing loss 11/05/2022   Chronic low back pain without sciatica 06/13/2022   Prediabetes 02/07/2022   Stage 3b chronic kidney disease (HCC) 03/08/2021   Mild cognitive impairment 08/03/2020   Osteoarthritis of multiple joints 08/03/2020   Hypertension    Hyperlipidemia    Allergic rhinitis    Gastroesophageal reflux disease    Presbycusis of both ears 10/19/2019     Physical Exam   Triage Vital Signs: ED Triage Vitals  Encounter Vitals Group     BP 05/20/24 1942 129/78     Girls Systolic BP Percentile --      Girls Diastolic BP Percentile --      Boys Systolic BP Percentile --      Boys Diastolic BP Percentile --      Pulse Rate 05/20/24 1942 100     Resp 05/20/24 1942 18     Temp 05/20/24 1942 98.6 F (37 C)     Temp src --      SpO2 05/20/24 1942 96 %     Weight 05/20/24 1941 189 lb (85.7 kg)     Height 05/20/24 1941 5' 10 (1.778 m)     Head Circumference --      Peak Flow --      Pain Score 05/20/24 1940 5     Pain Loc --      Pain Education --      Exclude from Growth  Chart --     Most recent vital signs: Vitals:   05/20/24 1942  BP: 129/78  Pulse: 100  Resp: 18  Temp: 98.6 F (37 C)  SpO2: 96%     Physical Exam Vitals and nursing note reviewed.  During triage vital signs were normal. General:          Awake, no distress.  No signs of difficulty breathing Throat: Peritonsillar erythema, no tonsillar enlargement. CV:                  Good peripheral perfusion. Regular rate and rhythm. Resp:               Normal effort. no tachypnea.Equal breath sounds bilaterally.  Abd:                 No distention.  Soft nontender Other:    Anterior chest: No tenderness to palpation. Left hip: Full ROM, no tenderness to palpation.             ED Results / Procedures /  Treatments   Labs (all labs ordered are listed, but only abnormal results are displayed) Labs Reviewed - No data to display   RADIOLOGY I independently reviewed and interpreted imaging and agree with radiologists findings.     PROCEDURES:  Critical Care performed:   Procedures   MEDICATIONS ORDERED IN ED: Medications  lidocaine  (LIDODERM ) 5 % 1 patch (has no administration in time range)   Clinical Course as of 05/20/24 2345  Thu May 20, 2024  2300 DG Hip Unilat W or Wo Pelvis 2-3 Views Left  No acute fracture or dislocation. [AE]  2300 DG Chest 1 View  1. Peribronchial thickening, possibly reflecting bronchitis.   [AE]  2338 CT Cervical Spine Wo Contrast No acute abnormality of the cervical spine. [AE]  2340 CT Head Wo Contrast No acute skull fracture. No acute soft tissue abnormality. [AE]  2340 Updated patient and family with results of CT of the head and cervical spine.  Daughter states they can do CBC and CMP at the facility.  Patient is ready for discharge [AE]    Clinical Course User Index [AE] Janit Kast, PA-C    IMPRESSION / MDM / ASSESSMENT AND PLAN / ED COURSE  I reviewed the triage vital signs and the nursing notes.  Differential diagnosis  includes, but is not limited to, intracranial hemorrhage, left hip fracture, skull fracture  Patient's presentation is most consistent with acute complicated illness / injury requiring diagnostic workup.   Frederick Simon is a 89 y.o., male who was brought today with history of fall.  No witnesses of the fall.  Patient cannot remember if he hit his head or lose consciousness.  See HPI.  On a physical exam, patient is not using accessory muscles, head atraumatic.  Throat peritonsillar erythema.  Cardiopulmonary is clear.  Left hip, no tenderness to palpation, full ROM, pulses positive.  Rest of physical exam is normal Plan Acetaminophen  CT of the head, cervical CT CBC and CMP Reassess X-ray of the left hip rule out hip fracture.  Chest x-ray showed bronchitis. Patient's diagnosis is consistent with fall, bronchitis, flu. I independently reviewed and interpreted imaging and agree with radiologists findings. I did review the patient's allergies and medications.The patient is in stable and satisfactory condition for discharge home  Patient will be discharged home with prescriptions for acetaminophen . Patient is to follow up with PCP as needed or otherwise directed. Patient is given ED precautions to return to the ED for any worsening or new symptoms. Discussed plan of care with patient, answered all of patient's questions, patient agreeable to plan of care. Advised patient to take medications according to the instructions on the label. Discussed possible side effects of new medications. Patient verbalized understanding.  FINAL CLINICAL IMPRESSION(S) / ED DIAGNOSES   Final diagnoses:  Fall, initial encounter  Bronchitis  Influenza     Rx / DC Orders   ED Discharge Orders          Ordered    lidocaine  (LIDODERM ) 5 %  Every 12 hours        05/20/24 2345             Note:  This document was prepared using Dragon voice recognition software and may include unintentional dictation  errors.   Janit Kast, PA-C 05/20/24 2345    Willo Dunnings, MD 05/21/24 2329

## 2024-05-20 NOTE — Discharge Instructions (Addendum)
 You have been diagnosed with fall, bronchitis, influenza.  Please drink plenty fluids.  You can take acetaminophen  650 mg every 8 hours as needed for pain.  You can apply lidocaine  patch onto your skin every 12 hours in the left hip for pain.  Please come back to ED or go to your PCP if you have new symptoms symptoms worsen.

## 2024-05-20 NOTE — ED Triage Notes (Signed)
 Per daughter pt was found on the floor at the nursing home today, pt was also tested for covid and flu and came back positive for flu. Per daughter she wants pt checked out from fall. Daughter states she noticed pt favoring left leg.

## 2024-05-24 ENCOUNTER — Non-Acute Institutional Stay (SKILLED_NURSING_FACILITY): Payer: Self-pay | Admitting: Orthopedic Surgery

## 2024-05-24 ENCOUNTER — Encounter: Payer: Self-pay | Admitting: Orthopedic Surgery

## 2024-05-24 DIAGNOSIS — G3184 Mild cognitive impairment, so stated: Secondary | ICD-10-CM

## 2024-05-24 DIAGNOSIS — M25552 Pain in left hip: Secondary | ICD-10-CM | POA: Diagnosis not present

## 2024-05-24 DIAGNOSIS — H903 Sensorineural hearing loss, bilateral: Secondary | ICD-10-CM | POA: Diagnosis not present

## 2024-05-24 DIAGNOSIS — I10 Essential (primary) hypertension: Secondary | ICD-10-CM

## 2024-05-24 DIAGNOSIS — N1832 Chronic kidney disease, stage 3b: Secondary | ICD-10-CM | POA: Diagnosis not present

## 2024-05-24 DIAGNOSIS — R269 Unspecified abnormalities of gait and mobility: Secondary | ICD-10-CM | POA: Diagnosis not present

## 2024-05-24 DIAGNOSIS — J101 Influenza due to other identified influenza virus with other respiratory manifestations: Secondary | ICD-10-CM

## 2024-05-24 LAB — CBC AND DIFFERENTIAL
HCT: 39 — AB (ref 41–53)
Hemoglobin: 12.4 — AB (ref 13.5–17.5)
Neutrophils Absolute: 1210
Platelets: 138 K/uL — AB (ref 150–400)
WBC: 3.7

## 2024-05-24 LAB — BASIC METABOLIC PANEL WITH GFR
BUN: 33 — AB (ref 4–21)
CO2: 22 (ref 13–22)
Chloride: 111 — AB (ref 99–108)
Creatinine: 1.7 — AB (ref 0.6–1.3)
Glucose: 97
Potassium: 4.3 meq/L (ref 3.5–5.1)
Sodium: 142 (ref 137–147)

## 2024-05-24 LAB — CBC: RBC: 4.09 (ref 3.87–5.11)

## 2024-05-24 LAB — COMPREHENSIVE METABOLIC PANEL WITH GFR
Calcium: 8.2 — AB (ref 8.7–10.7)
eGFR: 36

## 2024-05-24 NOTE — Progress Notes (Unsigned)
 Location:  Other [place].  Nursing Home Room Number: [facility] [id number] Place of Service:  SNF [phone]) Provider:  Greig Cluster, NP  PCP: Laurence Locus, DO  Patient Care Team: Laurence Locus, DO as PCP - General (Internal Medicine) [name], NP as Nurse Practitioner (Geriatric Medicine) Darron Deatrice LABOR, MD as Consulting Physician (Cardiology)  Extended Emergency Contact Information Primary Emergency Contact: [name],[name]  [place] Home Phone: [phone] Mobile Phone: [phone] Relation: Daughter Secondary Emergency Contact: Jackquline Ip  [place] Mobile Phone: [phone] Relation: Daughter  Code Status:  DNR Goals of care: Advanced Directive information    05/20/2024    7:41 PM  Advanced Directives  Does Patient Have a Medical Advance Directive? No     Chief Complaint  Patient presents with   Hospitalization Follow-up    Hospital Follow up    HPI:  Pt is a 89 y.o. male seen today for f/u s/p ED visit 12/11-12/12 due to fall.   He currently resides on the rehab unit at [facility]. PMH: HTN, HLD, GERD, bilateral hearing loss, OA, CKD, cognitive impairment, prediabetes and chronic back pain.   Resident of AL, he was found on the floor by nursing. Due to sudden weakness he was tested for covid/flu. He was flu +. He c/o left hip pain after encounter and was sent to ED for evaluation. CT head/spine negative for acute abnormality. Left hip xray negative for fracture or dislocation. CXR noted bronchitis. He was given lidocaine  patch and some tylenol  ans pain improved. Lab work was not performed. Flu symptoms mild and not started on Tamiflu. He was discharged to rehab for PT/OT/nursing services.   Today, exam limited due to poor hearing. He is wearing bilateral hearing aids but unable to follow some commands or state how he feels. He denies feeling unwell. Ambulating with wheelchair. Denies left hip pain. Appetite fair per nursing.  Afebrile. Vitals stable.     Past Medical History:  Diagnosis Date   Acute hypoxemic respiratory failure (HCC) 02/10/2022   AKI (acute kidney injury) 02/09/2022   Allergic rhinitis    CAP (community acquired pneumonia) 02/09/2022   Gastroesophageal reflux disease    Hyperlipidemia    Hypertension    Nausea & vomiting 02/11/2022   Severe sepsis (HCC) 02/10/2022   Past Surgical History:  Procedure Laterality Date   BACK SURGERY  May 1975    Dr. Carlos SANES SURGERY  Feb 2004   Dr. Alix    HAND SURGERY  [phone]   Dr. Lucious   NOSE SURGERY  1989   Dr. Jerilynn ROYS SURGERY  [phone]   Dr. Lucious    Allergies[1]  Outpatient Encounter Medications as of 05/24/2024  Medication Sig   acetaminophen  (TYLENOL ) 650 MG CR tablet Take 650 mg by mouth 2 (two) times daily.   albuterol  (VENTOLIN  HFA) 108 (90 Base) MCG/ACT inhaler Inhale 1 puff into the lungs every 4 (four) hours as needed for wheezing or shortness of breath.   amLODipine (NORVASC) 5 MG tablet Take 5 mg by mouth daily.   Ascorbic Acid (VITAMIN C) 1000 MG tablet Take 1,000 mg by mouth daily.   Cholecalciferol  (VITAMIN D ) 50 MCG (2000 UT) CAPS Give one capsule by mouth once daily.   lidocaine  (LIDODERM ) 5 % Place 1 patch onto the skin every 12 (twelve) hours for 5 days. Remove & Discard patch within 12 hours or as directed by MD   losartan  (COZAAR ) 100 MG tablet Take  100 mg by mouth daily.   melatonin 5 MG TABS Take 5 mg by mouth at bedtime.   Multiple Vitamins-Minerals (PRESERVISION AREDS PO) Take 1 tablet by mouth daily.   polyethylene glycol (MIRALAX  / GLYCOLAX ) 17 g packet Take 17 g by mouth daily as needed.   sennosides-docusate sodium  (SENOKOT-S) 8.6-50 MG tablet Take 1 tablet by mouth at bedtime.   simvastatin  (ZOCOR ) 20 MG tablet Take 20 mg by mouth daily.   triamcinolone cream (KENALOG) 0.1 % Apply 1 Application topically 2 (two) times daily as needed.   vitamin B-12 (CYANOCOBALAMIN ) 1000 MCG  tablet Take 1,000 mcg by mouth daily.   alum & mag hydroxide-simeth (MAALOX PLUS) 400-400-40 MG/5ML suspension Take 10 mLs by mouth every 4 (four) hours as needed for indigestion. (Patient not taking: Reported on 05/24/2024)   bismuth subsalicylate (PEPTO BISMOL) 262 MG/15ML suspension Give 10cc by mouth as needed for loose stool (Patient not taking: Reported on 05/24/2024)   calcium carbonate (TUMS - DOSED IN MG ELEMENTAL CALCIUM) 500 MG chewable tablet Chew 1 tablet by mouth every 6 (six) hours as needed for indigestion or heartburn. (Patient not taking: Reported on 05/24/2024)   carbamide peroxide (DEBROX) 6.5 % OTIC solution Place 5 drops into both ears as needed (for ear wax). (Patient not taking: Reported on 05/24/2024)   diclofenac Sodium (VOLTAREN) 1 % GEL Apply to lower back topically every 12 hours as needed for pain. (Patient not taking: Reported on 05/24/2024)   Glucose 15 GM/32ML GEL Give 1 packet by mouth as needed for low blood sugar. (Patient not taking: Reported on 05/24/2024)   magnesium hydroxide (MILK OF MAGNESIA) 400 MG/5ML suspension Give 2 Tbsp by mouth as needed for constipation. supervised self-administration daily  Call MD if no relief in 3 days of continued use. (Patient not taking: Reported on 05/24/2024)   nystatin (MYCOSTATIN/NYSTOP) powder Apply 1 Application topically 2 (two) times daily. (Patient not taking: Reported on 05/24/2024)   ondansetron  (ZOFRAN ) 4 MG tablet Take 4 mg by mouth 3 (three) times daily as needed for nausea or vomiting. (Patient not taking: Reported on 05/24/2024)   OXYGEN 2lmp for dyspnea or SOB (Patient not taking: Reported on 05/24/2024)   pseudoephedrine-dextromethorphan -guaifenesin  (ROBITUSSIN-PE) 30-10-100 MG/5ML solution Take 10 mLs by mouth every 4 (four) hours as needed for cough. (Patient not taking: Reported on 05/24/2024)   No facility-administered encounter medications on file as of 05/24/2024.    Review of Systems  Unable to  perform ROS: Dementia    Immunization History  Administered Date(s) Administered   Fluad Trivalent(High Dose 65+) 03/28/2023   Influenza-Unspecified 03/27/2021, 03/27/2022   Moderna Covid-19 Fall Seasonal Vaccine 39yrs & older 09/17/2022   Moderna Sars-Covid-2 Vaccination 07/13/2019, 08/12/2019, 11/06/2021, 04/19/2022, 03/07/2023   Pneumococcal Conjugate-13 06/10/2013   Pneumococcal Polysaccharide-23 06/11/2007, 11/17/2017   Tdap 12/25/2012, 01/07/2024   Unspecified SARS-COV-2 Vaccination 04/02/2020, 10/27/2020, 03/02/2021, 09/19/2023   Zoster Recombinant(Shingrix) 09/20/2009, 11/12/2022   Pertinent  Health Maintenance Due  Topic Date Due   Influenza Vaccine  01/09/2024      02/13/2022    9:30 PM 02/14/2022   10:00 AM 02/14/2022    7:30 PM 02/15/2022    7:38 AM 12/04/2023   11:58 AM  Fall Risk  Falls in the past year?     0  Was there an injury with Fall?     0   Fall Risk Category Calculator     0  (RETIRED) Patient Fall Risk Level High fall risk  High fall risk  High  fall risk  High fall risk    Patient at Risk for Falls Due to     History of fall(s)  Fall risk Follow up     Falls evaluation completed     Data saved with a previous flowsheet row definition   Functional Status Survey:    Vitals:   05/24/24 1011 05/24/24 1017  BP: (!) 140/58 (!) 140/58  Pulse: 68   Resp: 17   Temp: 97.8 F (36.6 C)   SpO2: 95%   Weight: 185 lb 3.2 oz (84 kg)   Height: 5' 10 (1.778 m)    Body mass index is 26.57 kg/m. Physical Exam Vitals reviewed.  Constitutional:      General: He is not in acute distress.    Appearance: He is not ill-appearing.  HENT:     Head: Normocephalic.     Ears:     Comments: Bilateral hearing aids, HOH    Nose: Rhinorrhea present.     Mouth/Throat:     Mouth: Mucous membranes are moist.     Pharynx: No posterior oropharyngeal erythema.  Eyes:     General:        Right eye: No discharge.        Left eye: No discharge.  Cardiovascular:     Rate and  Rhythm: Normal rate and regular rhythm.     Pulses: Normal pulses.     Heart sounds: Normal heart sounds.  Pulmonary:     Effort: Pulmonary effort is normal.     Breath sounds: Normal breath sounds.  Abdominal:     General: Bowel sounds are normal. There is no distension.     Palpations: Abdomen is soft.     Tenderness: There is no abdominal tenderness.  Musculoskeletal:     Cervical back: Neck supple.     Right lower leg: No edema.     Left lower leg: No edema.  Skin:    General: Skin is warm.     Capillary Refill: Capillary refill takes less than 2 seconds.  Neurological:     General: No focal deficit present.     Mental Status: He is alert. Mental status is at baseline.     Motor: Weakness present.     Gait: Gait abnormal.  Psychiatric:        Mood and Affect: Mood normal.     Labs reviewed: Recent Labs    07/14/23 0000 08/11/23 0000 03/25/24 0000  NA 139 140 142  K 4.5 4.5 4.3  CL 108 108 109*  CO2 25* 27* 25*  BUN 28* 29* 27*  CREATININE 1.9* 1.8* 1.6*  CALCIUM 9.3 9.0 8.8   Recent Labs    03/25/24 0000  AST 14  ALT 13  ALKPHOS 71  ALBUMIN  3.9   Recent Labs    09/22/23 0000 03/25/24 0000  WBC 5.2 6.0  NEUTROABS 1,799.00 2,670.00  HGB 13.4* 12.7*  HCT 41 39*  PLT 202 180   Lab Results  Component Value Date   TSH 0.25 (A) 03/25/2024   Lab Results  Component Value Date   HGBA1C 6.4 09/22/2023   Lab Results  Component Value Date   CHOL 125 09/22/2023   HDL 37 09/22/2023   LDLCALC 69 09/22/2023   TRIG 108 09/22/2023   CHOLHDL 2.7 02/09/2022    Significant Diagnostic Results in last 30 days:  CT Head Wo Contrast Result Date: 05/20/2024 EXAM: CT HEAD WITHOUT 05/20/2024 11:33:35 PM TECHNIQUE: CT of the head was performed  without the administration of intravenous contrast. Automated exposure control, iterative reconstruction, and/or weight based adjustment of the mA/kV was utilized to reduce the radiation dose to as low as reasonably  achievable. COMPARISON: 06/27/2017 CLINICAL HISTORY: Head trauma, minor (Age >= 65y); No witnesses FINDINGS: BRAIN AND VENTRICLES: No acute intracranial hemorrhage. No mass effect or midline shift. No extra-axial fluid collection. No evidence of acute infarct. No hydrocephalus. Mild small vessel ischemic changes. Mild cortical atrophy. Intracranial atherosclerosis. ORBITS: No acute abnormality. SINUSES AND MASTOIDS: No acute abnormality. SOFT TISSUES AND SKULL: No acute skull fracture. No acute soft tissue abnormality. IMPRESSION: 1. No acute intracranial abnormality. Electronically signed by: Pinkie Pebbles MD 05/20/2024 11:36 PM EST RP Workstation: [id number]   CT Cervical Spine Wo Contrast Result Date: 05/20/2024 EXAM: CT CERVICAL SPINE WITHOUT CONTRAST 05/20/2024 11:33:35 PM TECHNIQUE: CT of the cervical spine was performed without the administration of intravenous contrast. Multiplanar reformatted images are provided for review. Automated exposure control, iterative reconstruction, and/or weight based adjustment of the mA/kV was utilized to reduce the radiation dose to as low as reasonably achievable. COMPARISON: None available. CLINICAL HISTORY: Neck trauma (Age >= 65y) FINDINGS: CERVICAL SPINE: BONES AND ALIGNMENT: No acute fracture or traumatic malalignment. DEGENERATIVE CHANGES: No significant degenerative changes. SOFT TISSUES: No prevertebral soft tissue swelling. IMPRESSION: 1. No acute abnormality of the cervical spine. Electronically signed by: Pinkie Pebbles MD 05/20/2024 11:35 PM EST RP Workstation: [id number]   DG Hip Unilat W or Wo Pelvis 2-3 Views Left Result Date: 05/20/2024 EXAM: 3 VIEW(S) XRAY OF THE LEFT HIP 05/20/2024 08:15:54 PM COMPARISON: None available. CLINICAL HISTORY: fall FINDINGS: BONES AND JOINTS: No acute fracture. No malalignment. SOFT TISSUES: The soft tissues are unremarkable. IMPRESSION: 1. No acute fracture or dislocation. Electronically signed by: Pinkie Pebbles  MD 05/20/2024 08:56 PM EST RP Workstation: [id number]   DG Chest 1 View Result Date: 05/20/2024 EXAM: 1 VIEW(S) XRAY OF THE CHEST 05/20/2024 08:15:54 PM COMPARISON: 08/01/2022 CLINICAL HISTORY: Cough FINDINGS: LUNGS AND PLEURA: Peribronchial thickening may reflect bronchitis. No pleural effusion. No pneumothorax. HEART AND MEDIASTINUM: No acute abnormality of the cardiac and mediastinal silhouettes. BONES AND SOFT TISSUES: No acute osseous abnormality. IMPRESSION: 1. Peribronchial thickening, possibly reflecting bronchitis. Electronically signed by: Franky Crease MD 05/20/2024 08:21 PM EST RP Workstation: [id number]    Assessment/Plan 1. Left hip pain (Primary) - 12/11 mechanical fall> ED evaluation> xray negative for fracture  -  transferred to rehab due to weakness/ unstable gait  - cont lidocaine  and tylenol  for pain   2. Influenza A - 12/11 tested +  - mild symptoms - not on Tamiflu - cont droplet precautions   3. Mild cognitive impairment - MOCA 22/30 12/2022 - no behaviors - dependent with some ADLs - weight stable  - not on medication   4. Primary hypertension - controlled , goal < 150/90 - cont losartan   5. Stage 3b chronic kidney disease (HCC) - GFR 39 ( 10/16)> was 30 (10/31) - encourage hydration with water - avoid NSAIDS  6. Bilateral sensorineural hearing loss  7. Gait abnormality - 12/11 mechanical fall - cont PT/OT    Family/ staff Communication: plan discussed with patient and nurse  Labs/tests ordered:  none        [1]  Allergies Allergen Reactions   Dust Mite Extract Other (See Comments)    sneeze   Wool Alcohol [Lanolin] Itching

## 2024-05-26 ENCOUNTER — Encounter: Payer: Self-pay | Admitting: Internal Medicine

## 2024-05-26 ENCOUNTER — Non-Acute Institutional Stay (SKILLED_NURSING_FACILITY): Payer: Self-pay | Admitting: Internal Medicine

## 2024-05-26 DIAGNOSIS — G3184 Mild cognitive impairment, so stated: Secondary | ICD-10-CM | POA: Diagnosis not present

## 2024-05-26 DIAGNOSIS — R5381 Other malaise: Secondary | ICD-10-CM | POA: Diagnosis not present

## 2024-05-26 DIAGNOSIS — B948 Sequelae of other specified infectious and parasitic diseases: Secondary | ICD-10-CM | POA: Diagnosis not present

## 2024-05-26 DIAGNOSIS — H9113 Presbycusis, bilateral: Secondary | ICD-10-CM | POA: Diagnosis not present

## 2024-05-26 DIAGNOSIS — N1832 Chronic kidney disease, stage 3b: Secondary | ICD-10-CM | POA: Diagnosis not present

## 2024-05-26 DIAGNOSIS — K219 Gastro-esophageal reflux disease without esophagitis: Secondary | ICD-10-CM

## 2024-05-26 DIAGNOSIS — I10 Essential (primary) hypertension: Secondary | ICD-10-CM | POA: Diagnosis not present

## 2024-05-26 DIAGNOSIS — E782 Mixed hyperlipidemia: Secondary | ICD-10-CM | POA: Diagnosis not present

## 2024-05-26 NOTE — Progress Notes (Unsigned)
 [facility] SNF Admission H&P  Provider: Camellia Door, DO Location:  Other [place].  Nursing Home Room Number: [facility] [id number] Place of Service:  SNF (31)   PCP: Door Camellia, DO Patient Care Team: Door Camellia, DO as PCP - General (Internal Medicine) [name], NP as Nurse Practitioner (Geriatric Medicine) Darron Deatrice LABOR, MD as Consulting Physician (Cardiology)   Extended Emergency Contact Information Primary Emergency Contact: [name],[name]  [place] Home Phone: [phone] Mobile Phone: [phone] Relation: Daughter Secondary Emergency Contact: Jackquline Ip  [place] Mobile Phone: [phone] Relation: Daughter   Goals of Care: Advanced Directive information    05/20/2024    7:41 PM  Advanced Directives  Does Patient Have a Medical Advance Directive? No    CODE STATUS: Do Not Resuscitate (DNR)    Chief Complaint  Patient presents with   Admission    Admission     HPI: Patient is a 89 y.o. male seen today for admission to [facility].   Frederick Simon is a 89 year old male who normally lives at [facility] in assisted living.  He has a history of hypertension, hyperlipidemia, reflux, mild cognitive impairment, CKD stage IIIb, pre-diabetes, bilateral hearing loss who was seen in the ER on 05/20/2024 after a fall at assisted living.  He was not formally admitted to the hospital.  He was also found to have influenza A.  He did not have any acute fractures.  He was seen by physical therapy and felt that he needed more assistance than could be provided at assisted living.  He was subsequently admitted to skilled nursing at [facility] on 05/21/2024.  He still has a cough.  He is not on any oxygen.  He still quite weak.  He is working with physical and Occupational Therapy.  Nursing reports no acute medical issues.  This is a comprehensive admission note to this SNF performed on this date less than 30 days from date of  admission. Included are preadmission medical/surgical history; reconciled medication list; family history; social history and comprehensive review of systems.  Corrections and additions to the records were documented. Comprehensive physical exam was also performed. Additionally a clinical summary was entered for each active diagnosis pertinent to this admission in the Problem List to enhance continuity of care.   Past Medical History:  Diagnosis Date   Acute hypoxemic respiratory failure (HCC) 02/10/2022   AKI (acute kidney injury) 02/09/2022   Allergic rhinitis    CAP (community acquired pneumonia) 02/09/2022   Gastroesophageal reflux disease    Hyperlipidemia    Hypertension    Nausea & vomiting 02/11/2022   Severe sepsis (HCC) 02/10/2022   Past Surgical History:  Procedure Laterality Date   BACK SURGERY  Rozann Holts 1975    Dr. Carlos SANES SURGERY  Feb 2004   Dr. Alix    HAND SURGERY  [phone]   Dr. Lucious   NOSE SURGERY  1989   Dr. Jerilynn ROYS SURGERY  [phone]   Dr. Lucious    reports that he has quit smoking. He has never used smokeless tobacco. He reports that he does not drink alcohol and does not use drugs. Social History   Socioeconomic History   Marital status: Widowed    Spouse name: Not on file   Number of children: 4   Years of education: Not on file   Highest education level: Not on file  Occupational History   Occupation: Geologist, Engineering:  [employer]  Tobacco Use   Smoking status: Former   Smokeless tobacco: Never   Tobacco comments:    quit 1980's  Vaping Use   Vaping status: Never Used  Substance and Sexual Activity   Alcohol use: No   Drug use: No   Sexual activity: Not on file  Other Topics Concern   Not on file  Social History Narrative   4 daughters      Has advanced directives   Daughter [place] is health care POA   Requests DNR--done 08/03/20   No feeding tube if cognitively unaware   Social Drivers of Health    Tobacco Use: Medium Risk (05/26/2024)   Patient History    Smoking Tobacco Use: Former    Smokeless Tobacco Use: Never    Passive Exposure: Not on Actuary Strain: Not on file  Food Insecurity: Not on file  Transportation Needs: Not on file  Physical Activity: Not on file  Stress: Not on file  Social Connections: Not on file  Intimate Partner Violence: Not on file  Depression (PHQ2-9): Low Risk (05/25/2024)   Depression (PHQ2-9)    PHQ-2 Score: 0  Alcohol Screen: Not on file  Housing: Not on file  Utilities: Not on file  Health Literacy: Not on file     Functional Status Survey:     Family History  Problem Relation Age of Onset   Cancer Mother    Hypertension Mother    Hypertension Father    Stroke Father    Lupus Sister      Health Maintenance  Topic Date Due   Influenza Vaccine  01/09/2024   COVID-19 Vaccine (11 - 2025-26 season) 02/09/2024   Medicare Annual Wellness (AWV)  12/03/2024   DTaP/Tdap/Td (3 - Td or Tdap) 01/06/2034   Pneumococcal Vaccine: 50+ Years  Completed   Zoster Vaccines- Shingrix  Completed   Meningococcal B Vaccine  Aged Out     Allergies[1]   Outpatient Encounter Medications as of 05/26/2024  Medication Sig   acetaminophen  (TYLENOL ) 650 MG CR tablet Take 650 mg by mouth 2 (two) times daily.   albuterol  (VENTOLIN  HFA) 108 (90 Base) MCG/ACT inhaler Inhale 1 puff into the lungs every 4 (four) hours as needed for wheezing or shortness of breath.   amLODipine (NORVASC) 5 MG tablet Take 5 mg by mouth daily.   Ascorbic Acid (VITAMIN C) 1000 MG tablet Take 1,000 mg by mouth daily.   Cholecalciferol  (VITAMIN D ) 50 MCG (2000 UT) CAPS Give one capsule by mouth once daily.   lidocaine  4 % Place 1 patch onto the skin daily. As needed.   losartan  (COZAAR ) 100 MG tablet Take 100 mg by mouth daily.   melatonin 5 MG TABS Take 5 mg by mouth at bedtime.   Multiple Vitamins-Minerals (PRESERVISION AREDS PO) Take 1 tablet by mouth daily.    polyethylene glycol (MIRALAX  / GLYCOLAX ) 17 g packet Take 17 g by mouth daily as needed.   sennosides-docusate sodium  (SENOKOT-S) 8.6-50 MG tablet Take 1 tablet by mouth at bedtime.   simvastatin  (ZOCOR ) 20 MG tablet Take 20 mg by mouth daily.   vitamin B-12 (CYANOCOBALAMIN ) 1000 MCG tablet Take 1,000 mcg by mouth daily.   triamcinolone cream (KENALOG) 0.1 % Apply 1 Application topically 2 (two) times daily as needed. (Patient not taking: Reported on 05/26/2024)   No facility-administered encounter medications on file as of 05/26/2024.     Review of Systems  Constitutional: Negative.   HENT: Negative.  Eyes: Negative.   Respiratory:  Positive for cough and wheezing. Negative for shortness of breath.   Cardiovascular: Negative.   Gastrointestinal: Negative.   Endocrine: Negative.   Genitourinary: Negative.   Musculoskeletal: Negative.   Allergic/Immunologic: Negative.   Neurological:  Positive for weakness.  Hematological: Negative.   Psychiatric/Behavioral: Negative.    All other systems reviewed and are negative.    Vitals:   05/26/24 0837  BP: (!) 141/85  Pulse: 69  Resp: 18  Temp: 97.6 F (36.4 C)  SpO2: 95%  Weight: 185 lb 3.2 oz (84 kg)  Height: 5' 10 (1.778 m)   Body mass index is 26.57 kg/m. Physical Exam Vitals and nursing note reviewed.  Constitutional:      Appearance: He is not toxic-appearing.  HENT:     Head: Normocephalic and atraumatic.     Nose: Nose normal.  Cardiovascular:     Rate and Rhythm: Normal rate and regular rhythm.  Pulmonary:     Effort: Pulmonary effort is normal. No respiratory distress.     Breath sounds: Rhonchi and rales present.     Comments: Coarse BS with scattered rales and occ wheezing in bilateral bases. No rhonchi. No respiratory distress Abdominal:     General: Bowel sounds are normal. There is no distension.     Palpations: Abdomen is soft.     Tenderness: There is no abdominal tenderness.  Musculoskeletal:      Right lower leg: No edema.     Left lower leg: No edema.  Skin:    General: Skin is warm and dry.     Capillary Refill: Capillary refill takes less than 2 seconds.  Neurological:     Mental Status: He is alert.     Comments: Very hard of hearing      Labs reviewed: Basic Metabolic Panel: Recent Labs    08/11/23 0000 03/25/24 0000 05/24/24 0000  NA 140 142 142  K 4.5 4.3 4.3  CL 108 109* 111*  CO2 27* 25* 22  BUN 29* 27* 33*  CREATININE 1.8* 1.6* 1.7*  CALCIUM 9.0 8.8 8.2*   Liver Function Tests: Recent Labs    03/25/24 0000  AST 14  ALT 13  ALKPHOS 71  ALBUMIN  3.9    CBC: Recent Labs    09/22/23 0000 03/25/24 0000 05/24/24 0000  WBC 5.2 6.0 3.7  NEUTROABS 1,799.00 2,670.00 1,210.00  HGB 13.4* 12.7* 12.4*  HCT 41 39* 39*  PLT 202 180 138*    Lab Results  Component Value Date   HGBA1C 6.4 09/22/2023   Lab Results  Component Value Date   TSH 0.25 (A) 03/25/2024   Lab Results  Component Value Date   VITAMINB12 1,155 (H) 02/08/2022   Lab Results  Component Value Date   FOLATE 8.9 02/08/2022   Lab Results  Component Value Date   IRON  27 (L) 02/08/2022   TIBC 253 02/08/2022   FERRITIN 108 02/08/2022     Imaging and Procedures obtained prior to SNF admission: CT Head Wo Contrast Result Date: 05/20/2024 EXAM: CT HEAD WITHOUT 05/20/2024 11:33:35 PM TECHNIQUE: CT of the head was performed without the administration of intravenous contrast. Automated exposure control, iterative reconstruction, and/or weight based adjustment of the mA/kV was utilized to reduce the radiation dose to as low as reasonably achievable. COMPARISON: 06/27/2017 CLINICAL HISTORY: Head trauma, minor (Age >= 65y); No witnesses FINDINGS: BRAIN AND VENTRICLES: No acute intracranial hemorrhage. No mass effect or midline shift. No extra-axial fluid collection. No evidence  of acute infarct. No hydrocephalus. Mild small vessel ischemic changes. Mild cortical atrophy. Intracranial  atherosclerosis. ORBITS: No acute abnormality. SINUSES AND MASTOIDS: No acute abnormality. SOFT TISSUES AND SKULL: No acute skull fracture. No acute soft tissue abnormality. IMPRESSION: 1. No acute intracranial abnormality. Electronically signed by: Pinkie Pebbles MD 05/20/2024 11:36 PM EST RP Workstation: [id number]   CT Cervical Spine Wo Contrast Result Date: 05/20/2024 EXAM: CT CERVICAL SPINE WITHOUT CONTRAST 05/20/2024 11:33:35 PM TECHNIQUE: CT of the cervical spine was performed without the administration of intravenous contrast. Multiplanar reformatted images are provided for review. Automated exposure control, iterative reconstruction, and/or weight based adjustment of the mA/kV was utilized to reduce the radiation dose to as low as reasonably achievable. COMPARISON: None available. CLINICAL HISTORY: Neck trauma (Age >= 65y) FINDINGS: CERVICAL SPINE: BONES AND ALIGNMENT: No acute fracture or traumatic malalignment. DEGENERATIVE CHANGES: No significant degenerative changes. SOFT TISSUES: No prevertebral soft tissue swelling. IMPRESSION: 1. No acute abnormality of the cervical spine. Electronically signed by: Pinkie Pebbles MD 05/20/2024 11:35 PM EST RP Workstation: [id number]   DG Hip Unilat W or Wo Pelvis 2-3 Views Left Result Date: 05/20/2024 EXAM: 3 VIEW(S) XRAY OF THE LEFT HIP 05/20/2024 08:15:54 PM COMPARISON: None available. CLINICAL HISTORY: fall FINDINGS: BONES AND JOINTS: No acute fracture. No malalignment. SOFT TISSUES: The soft tissues are unremarkable. IMPRESSION: 1. No acute fracture or dislocation. Electronically signed by: Pinkie Pebbles MD 05/20/2024 08:56 PM EST RP Workstation: [id number]   DG Chest 1 View Result Date: 05/20/2024 EXAM: 1 VIEW(S) XRAY OF THE CHEST 05/20/2024 08:15:54 PM COMPARISON: 08/01/2022 CLINICAL HISTORY: Cough FINDINGS: LUNGS AND PLEURA: Peribronchial thickening Frederick Simon reflect bronchitis. No pleural effusion. No pneumothorax. HEART AND MEDIASTINUM: No acute  abnormality of the cardiac and mediastinal silhouettes. BONES AND SOFT TISSUES: No acute osseous abnormality. IMPRESSION: 1. Peribronchial thickening, possibly reflecting bronchitis. Electronically signed by: Franky Crease MD 05/20/2024 08:21 PM EST RP Workstation: [id number]     Assessment/Plan Post viral debility Continue with PT/OT. Needs ALF assessment to see if can return back to ALF  Stage 3b chronic kidney disease (HCC) - Scr 1.7 on 05-24-2024 Scr 1.7 on 05-24-2024  Essential (primary) hypertension Continue with losartan  100 mg daily, norvasc 5 mg daily  Mixed hyperlipidemia Continue with zocor  20 mg daily  Gastroesophageal reflux disease Stable. Not on PPI or H2 blocker  Mild cognitive impairment Stable.  Family/ staff Communication:. No family at bedside   There are no discontinued medications. Orders Placed This Encounter  Procedures   CBC and differential    This external order was created through the Results Console.   CBC    This external order was created through the Results Console.   Basic metabolic panel with GFR    This external order was created through the Results Console.   Comprehensive metabolic panel with GFR    This external order was created through the Results Console.     Camellia Door, DO  [facility] [phone]      [1]  Allergies Allergen Reactions   Dust Mite Extract Other (See Comments)    sneeze   Wool Alcohol [Lanolin] Itching

## 2024-05-27 DIAGNOSIS — B948 Sequelae of other specified infectious and parasitic diseases: Secondary | ICD-10-CM | POA: Insufficient documentation

## 2024-05-27 NOTE — Assessment & Plan Note (Signed)
 Stable

## 2024-05-27 NOTE — Assessment & Plan Note (Signed)
 Scr 1.7 on 05-24-2024

## 2024-05-27 NOTE — Assessment & Plan Note (Signed)
 Stable. Not on PPI or H2 blocker

## 2024-05-27 NOTE — Assessment & Plan Note (Signed)
 Continue with losartan  100 mg daily, norvasc 5 mg daily

## 2024-05-27 NOTE — Assessment & Plan Note (Signed)
 Continue with PT/OT. Needs ALF assessment to see if can return back to ALF

## 2024-05-27 NOTE — Assessment & Plan Note (Signed)
 Continue with zocor  20 mg daily

## 2024-06-15 ENCOUNTER — Non-Acute Institutional Stay (SKILLED_NURSING_FACILITY): Admitting: Nurse Practitioner

## 2024-06-15 ENCOUNTER — Encounter: Payer: Self-pay | Admitting: Nurse Practitioner

## 2024-06-15 DIAGNOSIS — R7303 Prediabetes: Secondary | ICD-10-CM | POA: Diagnosis not present

## 2024-06-15 DIAGNOSIS — F03B0 Unspecified dementia, moderate, without behavioral disturbance, psychotic disturbance, mood disturbance, and anxiety: Secondary | ICD-10-CM

## 2024-06-15 DIAGNOSIS — I10 Essential (primary) hypertension: Secondary | ICD-10-CM | POA: Diagnosis not present

## 2024-06-15 DIAGNOSIS — E782 Mixed hyperlipidemia: Secondary | ICD-10-CM

## 2024-06-15 DIAGNOSIS — N1832 Chronic kidney disease, stage 3b: Secondary | ICD-10-CM | POA: Diagnosis not present

## 2024-06-15 DIAGNOSIS — R7989 Other specified abnormal findings of blood chemistry: Secondary | ICD-10-CM | POA: Insufficient documentation

## 2024-06-15 DIAGNOSIS — M545 Low back pain, unspecified: Secondary | ICD-10-CM | POA: Diagnosis not present

## 2024-06-15 DIAGNOSIS — G8929 Other chronic pain: Secondary | ICD-10-CM

## 2024-06-15 NOTE — Assessment & Plan Note (Signed)
 Continue with zocor  20 mg daily, lipids at goal on last lab

## 2024-06-15 NOTE — Assessment & Plan Note (Signed)
 Will follow up TSh

## 2024-06-15 NOTE — Assessment & Plan Note (Signed)
 Stable, continues on schedued tylenol .

## 2024-06-15 NOTE — Assessment & Plan Note (Signed)
 Encourage hydration Will follow up bmp

## 2024-06-15 NOTE — Assessment & Plan Note (Signed)
Will follow up a1c

## 2024-06-15 NOTE — Progress Notes (Signed)
 " Location:  Other [place].  Nursing Home Room Number: [facility] [id number] Place of Service:  SNF [phone]) Harlene An, NP  PCP: Laurence Locus, DO  Patient Care Team: Laurence Locus, DO as PCP - General (Internal Medicine) An Harlene POUR, NP as Nurse Practitioner (Geriatric Medicine) Darron Deatrice LABOR, MD as Consulting Physician (Cardiology)  Extended Emergency Contact Information Primary Emergency Contact: [name],[name]  [place] Home Phone: [phone] Mobile Phone: [phone] Relation: Daughter Secondary Emergency Contact: Jackquline Ip  [place] Mobile Phone: [phone] Relation: Daughter  Goals of care: Advanced Directive information    06/15/2024    8:24 AM  Advanced Directives  Does Patient Have a Medical Advance Directive? Yes  Type of Advance Directive Out of facility DNR (pink MOST or yellow form)  Does patient want to make changes to medical advance directive? No - Patient declined     Chief Complaint  Patient presents with   Medical Management of Chronic Issues    Medical Management of Chronic Issues.     HPI:  Pt is a 89 y.o. male seen today for medical management of chronic disease.  He is now in [facility] for long term care- needing more assistance. Pt reports he is doing well Continues to have minor pains in his back that is chronic.  Needing LTC form completed  He is needing assistance with bathing and dressing but able to transfer and walk with assistance of walker.  Takes himself to the bathroom and needs stand by assist- needs increase in cueing.  No falls since in higher level of care.  Nursing without acute concerns.     Past Medical History:  Diagnosis Date   Acute hypoxemic respiratory failure (HCC) 02/10/2022   AKI (acute kidney injury) 02/09/2022   Allergic rhinitis    CAP (community acquired pneumonia) 02/09/2022   Gastroesophageal reflux disease    Hyperlipidemia    Hypertension    Nausea &  vomiting 02/11/2022   Severe sepsis (HCC) 02/10/2022   Past Surgical History:  Procedure Laterality Date   BACK SURGERY  May 1975    Dr. Carlos SANES SURGERY  Feb 2004   Dr. Alix    HAND SURGERY  [phone]   Dr. Lucious   NOSE SURGERY  1989   Dr. Jerilynn ROYS SURGERY  [phone]   Dr. Lucious    Allergies[1]  Outpatient Encounter Medications as of 06/15/2024  Medication Sig   acetaminophen  (TYLENOL ) 650 MG CR tablet Take 650 mg by mouth 2 (two) times daily.   albuterol  (VENTOLIN  HFA) 108 (90 Base) MCG/ACT inhaler Inhale 1 puff into the lungs every 4 (four) hours as needed for wheezing or shortness of breath.   amLODipine (NORVASC) 5 MG tablet Take 5 mg by mouth daily.   Cholecalciferol  (VITAMIN D ) 50 MCG (2000 UT) CAPS Give one capsule by mouth once daily.   lidocaine  4 % Place 1 patch onto the skin daily. As needed.   losartan  (COZAAR ) 100 MG tablet Take 100 mg by mouth daily.   melatonin 5 MG TABS Take 5 mg by mouth at bedtime.   Multiple Vitamins-Minerals (PRESERVISION AREDS PO) Take 1 tablet by mouth daily.   polyethylene glycol (MIRALAX  / GLYCOLAX ) 17 g packet Take 17 g by mouth daily as needed.   sennosides-docusate sodium  (SENOKOT-S) 8.6-50 MG tablet Take 1 tablet by mouth at bedtime.   simvastatin  (ZOCOR ) 20 MG tablet Take 20 mg by mouth daily.   vitamin B-12 (CYANOCOBALAMIN )  1000 MCG tablet Take 1,000 mcg by mouth daily.   Ascorbic Acid (VITAMIN C) 1000 MG tablet Take 1,000 mg by mouth daily. (Patient not taking: Reported on 06/15/2024)   triamcinolone cream (KENALOG) 0.1 % Apply 1 Application topically 2 (two) times daily as needed. (Patient not taking: Reported on 06/15/2024)   No facility-administered encounter medications on file as of 06/15/2024.    Review of Systems  Constitutional:  Negative for activity change, appetite change, fatigue and unexpected weight change.  HENT:  Positive for hearing loss. Negative for congestion.   Eyes: Negative.    Respiratory:  Negative for cough and shortness of breath.   Cardiovascular:  Negative for chest pain, palpitations and leg swelling.  Gastrointestinal:  Negative for abdominal pain, constipation and diarrhea.  Genitourinary:  Negative for difficulty urinating and dysuria.  Musculoskeletal:  Positive for back pain. Negative for arthralgias and myalgias.  Skin:  Negative for color change and wound.  Neurological:  Negative for dizziness and weakness.  Psychiatric/Behavioral:  Positive for confusion. Negative for agitation and behavioral problems.      Immunization History  Administered Date(s) Administered   Fluad Trivalent(High Dose 65+) 03/28/2023   Influenza-Unspecified 03/27/2021, 03/27/2022   Moderna Covid-19 Fall Seasonal Vaccine 76yrs & older 09/17/2022   Moderna Sars-Covid-2 Vaccination 07/13/2019, 08/12/2019, 11/06/2021, 04/19/2022, 03/07/2023   Pneumococcal Conjugate-13 06/10/2013, 02/19/2022   Pneumococcal Polysaccharide-23 06/11/2007, 11/17/2017   Tdap 12/25/2012, 01/07/2024   Unspecified SARS-COV-2 Vaccination 04/02/2020, 10/27/2020, 03/02/2021, 09/19/2023   Zoster Recombinant(Shingrix) 09/20/2009, 11/12/2022   Pertinent  Health Maintenance Due  Topic Date Due   Influenza Vaccine  01/09/2024      02/14/2022   10:00 AM 02/14/2022    7:30 PM 02/15/2022    7:38 AM 12/04/2023   11:58 AM 05/25/2024    1:21 PM  Fall Risk  Falls in the past year?    0 1  Was there an injury with Fall?    0  1  Fall Risk Category Calculator    0 3  (RETIRED) Patient Fall Risk Level High fall risk  High fall risk  High fall risk     Patient at Risk for Falls Due to    History of fall(s) Impaired balance/gait  Fall risk Follow up    Falls evaluation completed Falls evaluation completed     Data saved with a previous flowsheet row definition   Functional Status Survey:    Vitals:   06/15/24 0820  BP: 108/68  Pulse: 74  Resp: 18  Temp: 98.5 F (36.9 C)  SpO2: 98%  Weight: 184 lb 3.2 oz  (83.6 kg)  Height: 5' 10 (1.778 m)   Body mass index is 26.43 kg/m. Physical Exam Constitutional:      General: He is not in acute distress.    Appearance: He is well-developed. He is not diaphoretic.  HENT:     Head: Normocephalic and atraumatic.     Right Ear: External ear normal.     Left Ear: External ear normal.     Mouth/Throat:     Pharynx: No oropharyngeal exudate.  Eyes:     Conjunctiva/sclera: Conjunctivae normal.     Pupils: Pupils are equal, round, and reactive to light.  Cardiovascular:     Rate and Rhythm: Normal rate and regular rhythm.     Heart sounds: Normal heart sounds.  Pulmonary:     Effort: Pulmonary effort is normal.     Breath sounds: Normal breath sounds.  Abdominal:     General:  Bowel sounds are normal.     Palpations: Abdomen is soft.  Musculoskeletal:        General: No tenderness.     Cervical back: Normal range of motion and neck supple.     Right lower leg: No edema.     Left lower leg: No edema.  Skin:    General: Skin is warm and dry.  Neurological:     Mental Status: He is alert and oriented to person, place, and time.     Labs reviewed: Recent Labs    08/11/23 0000 03/25/24 0000 05/24/24 0000  NA 140 142 142  K 4.5 4.3 4.3  CL 108 109* 111*  CO2 27* 25* 22  BUN 29* 27* 33*  CREATININE 1.8* 1.6* 1.7*  CALCIUM 9.0 8.8 8.2*   Recent Labs    03/25/24 0000  AST 14  ALT 13  ALKPHOS 71  ALBUMIN  3.9   Recent Labs    09/22/23 0000 03/25/24 0000 05/24/24 0000  WBC 5.2 6.0 3.7  NEUTROABS 1,799.00 2,670.00 1,210.00  HGB 13.4* 12.7* 12.4*  HCT 41 39* 39*  PLT 202 180 138*   Lab Results  Component Value Date   TSH 0.25 (A) 03/25/2024   Lab Results  Component Value Date   HGBA1C 6.4 09/22/2023   Lab Results  Component Value Date   CHOL 125 09/22/2023   HDL 37 09/22/2023   LDLCALC 69 09/22/2023   TRIG 108 09/22/2023   CHOLHDL 2.7 02/09/2022    Significant Diagnostic Results in last 30 days:  CT Head Wo  Contrast Result Date: 05/20/2024 EXAM: CT HEAD WITHOUT 05/20/2024 11:33:35 PM TECHNIQUE: CT of the head was performed without the administration of intravenous contrast. Automated exposure control, iterative reconstruction, and/or weight based adjustment of the mA/kV was utilized to reduce the radiation dose to as low as reasonably achievable. COMPARISON: 06/27/2017 CLINICAL HISTORY: Head trauma, minor (Age >= 65y); No witnesses FINDINGS: BRAIN AND VENTRICLES: No acute intracranial hemorrhage. No mass effect or midline shift. No extra-axial fluid collection. No evidence of acute infarct. No hydrocephalus. Mild small vessel ischemic changes. Mild cortical atrophy. Intracranial atherosclerosis. ORBITS: No acute abnormality. SINUSES AND MASTOIDS: No acute abnormality. SOFT TISSUES AND SKULL: No acute skull fracture. No acute soft tissue abnormality. IMPRESSION: 1. No acute intracranial abnormality. Electronically signed by: Pinkie Pebbles MD 05/20/2024 11:36 PM EST RP Workstation: [id number]   CT Cervical Spine Wo Contrast Result Date: 05/20/2024 EXAM: CT CERVICAL SPINE WITHOUT CONTRAST 05/20/2024 11:33:35 PM TECHNIQUE: CT of the cervical spine was performed without the administration of intravenous contrast. Multiplanar reformatted images are provided for review. Automated exposure control, iterative reconstruction, and/or weight based adjustment of the mA/kV was utilized to reduce the radiation dose to as low as reasonably achievable. COMPARISON: None available. CLINICAL HISTORY: Neck trauma (Age >= 65y) FINDINGS: CERVICAL SPINE: BONES AND ALIGNMENT: No acute fracture or traumatic malalignment. DEGENERATIVE CHANGES: No significant degenerative changes. SOFT TISSUES: No prevertebral soft tissue swelling. IMPRESSION: 1. No acute abnormality of the cervical spine. Electronically signed by: Pinkie Pebbles MD 05/20/2024 11:35 PM EST RP Workstation: [id number]   DG Hip Unilat W or Wo Pelvis 2-3 Views  Left Result Date: 05/20/2024 EXAM: 3 VIEW(S) XRAY OF THE LEFT HIP 05/20/2024 08:15:54 PM COMPARISON: None available. CLINICAL HISTORY: fall FINDINGS: BONES AND JOINTS: No acute fracture. No malalignment. SOFT TISSUES: The soft tissues are unremarkable. IMPRESSION: 1. No acute fracture or dislocation. Electronically signed by: Pinkie Pebbles MD 05/20/2024 08:56 PM EST RP Workstation: [id number]  DG Chest 1 View Result Date: 05/20/2024 EXAM: 1 VIEW(S) XRAY OF THE CHEST 05/20/2024 08:15:54 PM COMPARISON: 08/01/2022 CLINICAL HISTORY: Cough FINDINGS: LUNGS AND PLEURA: Peribronchial thickening may reflect bronchitis. No pleural effusion. No pneumothorax. HEART AND MEDIASTINUM: No acute abnormality of the cardiac and mediastinal silhouettes. BONES AND SOFT TISSUES: No acute osseous abnormality. IMPRESSION: 1. Peribronchial thickening, possibly reflecting bronchitis. Electronically signed by: Franky Crease MD 05/20/2024 08:21 PM EST RP Workstation: [id number]    Assessment/Plan Stage 3b chronic kidney disease (HCC) - Scr 1.7 on 05-24-2024 Encourage hydration Will follow up bmp  Prediabetes Will follow up a1c   Mixed hyperlipidemia Continue with zocor  20 mg daily, lipids at goal on last lab  Moderate dementia without behavioral disturbance, psychotic disturbance, mood disturbance, or anxiety (HCC) Stable, no acute changes in cognitive or functional status, continue supportive care.  Form completed for LTC insurance per request  Low TSH level Will follow up TSh  Chronic low back pain without sciatica Stable, continues on schedued tylenol .     Ayven Glasco K. Caro BODILY [facility] [phone]       [1]  Allergies Allergen Reactions   Dust Mite Extract Other (See Comments)    sneeze   Wool Alcohol [Lanolin] Itching   "

## 2024-06-15 NOTE — Assessment & Plan Note (Signed)
 Stable, no acute changes in cognitive or functional status, continue supportive care.  Form completed for LTC insurance per request
